# Patient Record
Sex: Female | Born: 1970 | Race: White | Hispanic: No | Marital: Married | Smoking: Never smoker
Health system: Southern US, Community
[De-identification: ages and names within clinical notes are randomized; demographics above are authoritative.]

## PROBLEM LIST (undated history)

## (undated) DIAGNOSIS — F329 Major depressive disorder, single episode, unspecified: Secondary | ICD-10-CM

## (undated) DIAGNOSIS — F32A Depression, unspecified: Secondary | ICD-10-CM

## (undated) DIAGNOSIS — F419 Anxiety disorder, unspecified: Secondary | ICD-10-CM

## (undated) DIAGNOSIS — R87619 Unspecified abnormal cytological findings in specimens from cervix uteri: Secondary | ICD-10-CM

## (undated) DIAGNOSIS — F410 Panic disorder [episodic paroxysmal anxiety] without agoraphobia: Secondary | ICD-10-CM

## (undated) DIAGNOSIS — E7800 Pure hypercholesterolemia, unspecified: Secondary | ICD-10-CM

## (undated) DIAGNOSIS — L509 Urticaria, unspecified: Secondary | ICD-10-CM

## (undated) DIAGNOSIS — T7840XA Allergy, unspecified, initial encounter: Secondary | ICD-10-CM

## (undated) DIAGNOSIS — K219 Gastro-esophageal reflux disease without esophagitis: Secondary | ICD-10-CM

## (undated) HISTORY — DX: Anxiety disorder, unspecified: F41.9

## (undated) HISTORY — DX: Allergy, unspecified, initial encounter: T78.40XA

## (undated) HISTORY — PX: TONSILLECTOMY: SHX5217

## (undated) HISTORY — DX: Unspecified abnormal cytological findings in specimens from cervix uteri: R87.619

## (undated) HISTORY — DX: Pure hypercholesterolemia, unspecified: E78.00

## (undated) HISTORY — PX: CHOLECYSTECTOMY: SHX55

## (undated) HISTORY — PX: KNEE ARTHROSCOPY: SUR90

## (undated) HISTORY — DX: Panic disorder (episodic paroxysmal anxiety): F41.0

## (undated) HISTORY — DX: Major depressive disorder, single episode, unspecified: F32.9

## (undated) HISTORY — PX: KNEE ARTHROSCOPY W/ ACL RECONSTRUCTION: SHX1858

## (undated) HISTORY — DX: Gastro-esophageal reflux disease without esophagitis: K21.9

## (undated) HISTORY — DX: Urticaria, unspecified: L50.9

## (undated) HISTORY — PX: FRACTURE SURGERY: SHX138

## (undated) HISTORY — DX: Depression, unspecified: F32.A

---

## 1998-09-04 ENCOUNTER — Emergency Department (HOSPITAL_COMMUNITY): Admission: EM | Admit: 1998-09-04 | Discharge: 1998-09-04 | Payer: Self-pay | Admitting: Emergency Medicine

## 1999-05-02 ENCOUNTER — Emergency Department (HOSPITAL_COMMUNITY): Admission: EM | Admit: 1999-05-02 | Discharge: 1999-05-02 | Payer: Self-pay | Admitting: Emergency Medicine

## 1999-05-03 ENCOUNTER — Encounter: Payer: Self-pay | Admitting: *Deleted

## 2001-02-08 ENCOUNTER — Encounter: Payer: Self-pay | Admitting: Family Medicine

## 2001-02-08 ENCOUNTER — Encounter: Admission: RE | Admit: 2001-02-08 | Discharge: 2001-02-08 | Payer: Self-pay | Admitting: Family Medicine

## 2001-02-16 ENCOUNTER — Ambulatory Visit (HOSPITAL_COMMUNITY): Admission: RE | Admit: 2001-02-16 | Discharge: 2001-02-16 | Payer: Self-pay | Admitting: Gastroenterology

## 2001-02-16 ENCOUNTER — Encounter: Payer: Self-pay | Admitting: Gastroenterology

## 2003-01-06 ENCOUNTER — Emergency Department (HOSPITAL_COMMUNITY): Admission: EM | Admit: 2003-01-06 | Discharge: 2003-01-06 | Payer: Self-pay | Admitting: Emergency Medicine

## 2006-08-04 ENCOUNTER — Other Ambulatory Visit: Admission: RE | Admit: 2006-08-04 | Discharge: 2006-08-04 | Payer: Self-pay | Admitting: Obstetrics and Gynecology

## 2007-01-12 ENCOUNTER — Ambulatory Visit (HOSPITAL_COMMUNITY): Admission: RE | Admit: 2007-01-12 | Discharge: 2007-01-12 | Payer: Self-pay | Admitting: Obstetrics and Gynecology

## 2007-06-05 ENCOUNTER — Inpatient Hospital Stay (HOSPITAL_COMMUNITY): Admission: AD | Admit: 2007-06-05 | Discharge: 2007-06-08 | Payer: Self-pay | Admitting: Obstetrics and Gynecology

## 2009-02-10 ENCOUNTER — Ambulatory Visit: Payer: Self-pay | Admitting: Family Medicine

## 2009-02-10 ENCOUNTER — Other Ambulatory Visit: Admission: RE | Admit: 2009-02-10 | Discharge: 2009-02-10 | Payer: Self-pay | Admitting: Family Medicine

## 2009-02-10 ENCOUNTER — Encounter: Payer: Self-pay | Admitting: Family Medicine

## 2009-02-10 DIAGNOSIS — R5381 Other malaise: Secondary | ICD-10-CM

## 2009-02-10 DIAGNOSIS — K8020 Calculus of gallbladder without cholecystitis without obstruction: Secondary | ICD-10-CM | POA: Insufficient documentation

## 2009-02-10 DIAGNOSIS — N6019 Diffuse cystic mastopathy of unspecified breast: Secondary | ICD-10-CM | POA: Insufficient documentation

## 2009-02-10 DIAGNOSIS — R5383 Other fatigue: Secondary | ICD-10-CM

## 2009-02-10 LAB — CONVERTED CEMR LAB
ALT: 13 units/L (ref 0–35)
AST: 19 units/L (ref 0–37)
Albumin: 4.2 g/dL (ref 3.5–5.2)
Alkaline Phosphatase: 40 units/L (ref 39–117)
BUN: 14 mg/dL (ref 6–23)
Basophils Relative: 1 % (ref 0.0–3.0)
Bilirubin Urine: NEGATIVE
Bilirubin, Direct: 0 mg/dL (ref 0.0–0.3)
CO2: 29 meq/L (ref 19–32)
Calcium: 9.5 mg/dL (ref 8.4–10.5)
Chloride: 108 meq/L (ref 96–112)
Cholesterol: 182 mg/dL (ref 0–200)
Creatinine, Ser: 0.9 mg/dL (ref 0.4–1.2)
Eosinophils Relative: 14 % — ABNORMAL HIGH (ref 0.0–5.0)
Free T4: 0.8 ng/dL (ref 0.6–1.6)
GFR calc non Af Amer: 74.98 mL/min (ref >60)
Glucose, Bld: 102 mg/dL — ABNORMAL HIGH (ref 70–99)
Glucose, Urine, Semiquant: NEGATIVE
HCT: 39.6 % (ref 36.0–46.0)
HDL: 36.2 mg/dL — ABNORMAL LOW (ref >39.00)
Hemoglobin: 13.8 g/dL (ref 12.0–15.0)
Ketones, urine, test strip: NEGATIVE
LDL Cholesterol: 117 mg/dL — ABNORMAL HIGH (ref 0–99)
Lymphocytes Relative: 22 % (ref 12.0–46.0)
MCHC: 34.8 g/dL (ref 30.0–36.0)
MCV: 90.9 fL (ref 78.0–100.0)
Monocytes Relative: 10 % (ref 3.0–12.0)
Neutrophils Relative %: 53 % (ref 43.0–77.0)
Nitrite: NEGATIVE
Platelets: 240 10*3/uL (ref 150.0–400.0)
Potassium: 4.4 meq/L (ref 3.5–5.1)
Protein, U semiquant: NEGATIVE
RBC: 4.36 M/uL (ref 3.87–5.11)
RDW: 11.8 % (ref 11.5–14.6)
Sodium: 142 meq/L (ref 135–145)
Specific Gravity, Urine: 1.02
T3, Free: 2.9 pg/mL (ref 2.3–4.2)
TSH: 1.26 microintl units/mL (ref 0.35–5.50)
Total Bilirubin: 0.7 mg/dL (ref 0.3–1.2)
Total CHOL/HDL Ratio: 5
Total Protein: 7 g/dL (ref 6.0–8.3)
Triglycerides: 146 mg/dL (ref 0.0–149.0)
Urobilinogen, UA: 1
VLDL: 29.2 mg/dL (ref 0.0–40.0)
WBC Urine, dipstick: NEGATIVE
WBC: 9.4 10*3/uL (ref 4.5–10.5)
pH: 7

## 2009-02-11 ENCOUNTER — Telehealth: Payer: Self-pay | Admitting: Family Medicine

## 2009-03-14 ENCOUNTER — Ambulatory Visit: Payer: Self-pay | Admitting: Family Medicine

## 2009-03-14 DIAGNOSIS — M79609 Pain in unspecified limb: Secondary | ICD-10-CM

## 2009-03-24 ENCOUNTER — Ambulatory Visit: Payer: Self-pay | Admitting: Family Medicine

## 2009-03-24 ENCOUNTER — Telehealth: Payer: Self-pay | Admitting: Family Medicine

## 2009-04-03 ENCOUNTER — Encounter: Admission: RE | Admit: 2009-04-03 | Discharge: 2009-04-03 | Payer: Self-pay | Admitting: Family Medicine

## 2010-01-22 ENCOUNTER — Telehealth: Payer: Self-pay | Admitting: Family Medicine

## 2010-06-21 ENCOUNTER — Emergency Department (HOSPITAL_COMMUNITY)
Admission: EM | Admit: 2010-06-21 | Discharge: 2010-06-22 | Payer: Self-pay | Source: Home / Self Care | Admitting: Emergency Medicine

## 2010-06-21 LAB — DIFFERENTIAL
Basophils Absolute: 0 10*3/uL (ref 0.0–0.1)
Basophils Relative: 0 % (ref 0–1)
Eosinophils Absolute: 0.3 10*3/uL (ref 0.0–0.7)
Eosinophils Relative: 3 % (ref 0–5)
Lymphocytes Relative: 12 % (ref 12–46)
Lymphs Abs: 1.7 10*3/uL (ref 0.7–4.0)
Monocytes Absolute: 0.9 10*3/uL (ref 0.1–1.0)
Monocytes Relative: 7 % (ref 3–12)
Neutro Abs: 10.8 10*3/uL — ABNORMAL HIGH (ref 1.7–7.7)
Neutrophils Relative %: 79 % — ABNORMAL HIGH (ref 43–77)

## 2010-06-21 LAB — BASIC METABOLIC PANEL
BUN: 12 mg/dL (ref 6–23)
CO2: 25 mEq/L (ref 19–32)
Calcium: 9.3 mg/dL (ref 8.4–10.5)
Chloride: 106 mEq/L (ref 96–112)
Creatinine, Ser: 0.98 mg/dL (ref 0.4–1.2)
GFR calc Af Amer: >60 mL/min (ref >60)
GFR calc non Af Amer: >60 mL/min (ref >60)
Glucose, Bld: 118 mg/dL — ABNORMAL HIGH (ref 70–99)
Potassium: 3.5 mEq/L (ref 3.5–5.1)
Sodium: 136 mEq/L (ref 135–145)

## 2010-06-21 LAB — POCT CARDIAC MARKERS
CKMB, poc: <1 ng/mL — ABNORMAL LOW (ref 1.0–8.0)
Myoglobin, poc: 36.4 ng/mL (ref 12–200)

## 2010-06-21 LAB — CBC
HCT: 37 % (ref 36.0–46.0)
Hemoglobin: 13.4 g/dL (ref 12.0–15.0)
MCHC: 36.2 g/dL — ABNORMAL HIGH (ref 30.0–36.0)
WBC: 13.7 10*3/uL — ABNORMAL HIGH (ref 4.0–10.5)

## 2010-06-23 NOTE — Progress Notes (Signed)
Summary: UTI  Phone Note Call from Patient   Caller: Patient Call For: Roderick Pee MD Summary of Call: Reason for Call: Acute Illness Complaint: Urinary/GYN Problems Action Taken: Provider Notified Summary of Call: Pt has lower back pain and dysura with a positive home test for UTIs.  Traveling to [place] as soon as they can today.  Requesting Antibioitc. [id number] Initial call taken by: Lynann Beaver CMA,  January 22, 2010 8:41 AM Initial call taken by: Lynann Beaver CMA,  January 22, 2010 10:02 AM  Follow-up for Phone Call        come in now for office visit Follow-up by: Roderick Pee MD,  January 22, 2010 10:06 AM  Additional Follow-up for Phone Call Additional follow up Details #1::        Pt cannot come in......[facility] to [place]. Additional Follow-up by: Lynann Beaver CMA,  January 22, 2010 10:09 AM

## 2010-10-01 ENCOUNTER — Other Ambulatory Visit: Payer: Self-pay | Admitting: Physician Assistant

## 2010-10-01 DIAGNOSIS — Z1231 Encounter for screening mammogram for malignant neoplasm of breast: Secondary | ICD-10-CM

## 2010-10-06 ENCOUNTER — Ambulatory Visit
Admission: RE | Admit: 2010-10-06 | Discharge: 2010-10-06 | Disposition: A | Discharge status: Home / Self Care | Payer: No Typology Code available for payment source | Source: Ambulatory Visit | Attending: Physician Assistant | Admitting: Physician Assistant

## 2010-10-06 DIAGNOSIS — Z1231 Encounter for screening mammogram for malignant neoplasm of breast: Secondary | ICD-10-CM

## 2011-02-11 LAB — CBC
HCT: 30.7 — ABNORMAL LOW
Hemoglobin: 10.7 — ABNORMAL LOW
RBC: 4.32
WBC: 14.5 — ABNORMAL HIGH
WBC: 17.7 — ABNORMAL HIGH

## 2011-02-11 LAB — RPR: RPR Ser Ql: NONREACTIVE

## 2011-07-21 ENCOUNTER — Ambulatory Visit (INDEPENDENT_AMBULATORY_CARE_PROVIDER_SITE_OTHER): Payer: No Typology Code available for payment source | Admitting: Physician Assistant

## 2011-07-21 ENCOUNTER — Encounter: Payer: Self-pay | Admitting: Physician Assistant

## 2011-07-21 VITALS — BP 103/61 | HR 79 | Temp 98.5°F | Resp 16 | Ht 68.0 in | Wt 175.0 lb

## 2011-07-21 DIAGNOSIS — N6321 Unspecified lump in the left breast, upper outer quadrant: Secondary | ICD-10-CM

## 2011-07-21 DIAGNOSIS — N63 Unspecified lump in unspecified breast: Secondary | ICD-10-CM

## 2011-07-21 NOTE — Patient Instructions (Signed)
1:45pm today.  Dx MMG at [facility].  Order filled out.  Unable to enter in Epic

## 2011-07-21 NOTE — Progress Notes (Signed)
  Subjective:    Patient ID: Debbie Shaw, female    DOB: 27-Jun-1970, 41 y.o.   MRN: [id number]  HPI 2.5 week h/o lump in L breast.  Unchanged after menstrual cycle.  February 14thish was 1st day of last period.  Not on birth control.  Uses condoms with husband.    Last mammogram 09/2010 and was normal.  Review of Systems  All other systems reviewed and are negative.       Objective:   Physical Exam  Nursing note and vitals reviewed. Constitutional: She appears well-developed and well-nourished.  HENT:  Head: Normocephalic and atraumatic.  Neck: Normal range of motion. Neck supple.  Cardiovascular: Normal rate and regular rhythm.  Exam reveals no gallop and no friction rub.   No murmur heard. Pulmonary/Chest: Effort normal and breath sounds normal. She exhibits no mass. Right breast exhibits inverted nipple. Right breast exhibits no mass (def fibrocystic changes, B), no nipple discharge, no skin change and no tenderness. Left breast exhibits inverted nipple and mass. Left breast exhibits no nipple discharge, no skin change and no tenderness. Breasts are symmetrical.    Lymphadenopathy:    She has no cervical adenopathy.       Right axillary: No pectoral and no lateral adenopathy present.       Left axillary: No pectoral and no lateral adenopathy present.      Right: No supraclavicular adenopathy present.       Left: No supraclavicular adenopathy present.          Assessment & Plan:  Breast lump-Diagnostic Mammogram ASAP.

## 2011-08-10 ENCOUNTER — Encounter: Payer: Self-pay | Admitting: Physician Assistant

## 2011-09-07 ENCOUNTER — Ambulatory Visit (INDEPENDENT_AMBULATORY_CARE_PROVIDER_SITE_OTHER): Payer: No Typology Code available for payment source | Admitting: Family Medicine

## 2011-09-07 ENCOUNTER — Ambulatory Visit: Payer: No Typology Code available for payment source

## 2011-09-07 ENCOUNTER — Encounter: Payer: Self-pay | Admitting: Family Medicine

## 2011-09-07 VITALS — BP 108/69 | HR 64 | Temp 98.0°F | Resp 32 | Ht 69.0 in | Wt 170.0 lb

## 2011-09-07 DIAGNOSIS — R071 Chest pain on breathing: Secondary | ICD-10-CM

## 2011-09-07 DIAGNOSIS — M940 Chondrocostal junction syndrome [Tietze]: Secondary | ICD-10-CM

## 2011-09-07 LAB — POCT CBC
Granulocyte percent: 76.6 %G (ref 37–80)
HCT, POC: 38.8 % (ref 37.7–47.9)
Hemoglobin: 12.8 g/dL (ref 12.2–16.2)
Lymph, poc: 1.7 (ref 0.6–3.4)
MCH, POC: 29.6 pg (ref 27–31.2)
MCHC: 33 g/dL (ref 31.8–35.4)
MCV: 89.6 fL (ref 80–97)
MID (cbc): 0.7 (ref 0–0.9)
MPV: 8.7 fL (ref 0–99.8)
POC Granulocyte: 8 — AB (ref 2–6.9)
POC LYMPH PERCENT: 16.7 %L (ref 10–50)
POC MID %: 6.7 % (ref 0–12)
Platelet Count, POC: 317 10*3/uL (ref 142–424)
RBC: 4.33 M/uL (ref 4.04–5.48)
RDW, POC: 12.1 %
WBC: 10.4 10*3/uL — AB (ref 4.6–10.2)

## 2011-09-07 LAB — COMPREHENSIVE METABOLIC PANEL
Alkaline Phosphatase: 40 U/L (ref 39–117)
Creat: 0.97 mg/dL (ref 0.50–1.10)
Glucose, Bld: 91 mg/dL (ref 70–99)
Sodium: 137 mEq/L (ref 135–145)
Total Bilirubin: 0.6 mg/dL (ref 0.3–1.2)
Total Protein: 6.3 g/dL (ref 6.0–8.3)

## 2011-09-07 LAB — COMPREHENSIVE METABOLIC PANEL WITH GFR
ALT: 14 U/L (ref 0–35)
AST: 18 U/L (ref 0–37)
Albumin: 4 g/dL (ref 3.5–5.2)
BUN: 17 mg/dL (ref 6–23)
CO2: 27 meq/L (ref 19–32)
Calcium: 9 mg/dL (ref 8.4–10.5)
Chloride: 103 meq/L (ref 96–112)
Potassium: 3.7 meq/L (ref 3.5–5.3)

## 2011-09-07 LAB — GLUCOSE, POCT (MANUAL RESULT ENTRY): POC Glucose: 88

## 2011-09-07 NOTE — Patient Instructions (Signed)
Chest Wall Pain Chest wall pain is pain felt in or around the chest bones and muscles. It may take up to 6 weeks to get better. It may take longer if you are active. Chest wall pain can happen on its own. Other times, things like germs, injury, coughing, or exercise can cause the pain. HOME CARE   Avoid activities that make you tired or cause pain. Try not to use your chest, belly (abdominal), or side muscles. Do not use heavy weights.   Put ice on the sore area.   Put ice in a plastic bag.   Place a towel between your skin and the bag.   Leave the ice on for 15 to 20 minutes for the first 2 days.   Only take medicine as told by your doctor.  GET HELP RIGHT AWAY IF:   You have more pain or are very uncomfortable.   You have a fever.   Your chest pain gets worse.   You have new problems.   You feel sick to your stomach (nauseous) or throw up (vomit).   You start to sweat or feel lightheaded.   You have a cough with mucus (phlegm).   You cough up blood.  MAKE SURE YOU:   Understand these instructions.   Will watch your condition.   Will get help right away if you are not doing well or get worse.  Document Released: 10/27/2007 Document Revised: 04/29/2011 Document Reviewed: 01/04/2011 ExitCare Patient Information 2012 [employer]. 

## 2011-09-07 NOTE — Progress Notes (Signed)
[facility]:  Office Visit  Chief Complaint:  Chief Complaint  Patient presents with  . Chest Pain    HPI: Debbie Shaw is a 41 y.o. female who complains of  acute onset of right sided chest pain radiating to back which started 30 min ago while moving boxes. The boxes were not heavy.  Described as squeezing pain 10/10, she had to stop to help relieve some of the pain. Pain was worse with stretching and also with deep inspiration. Associated symptoms include slight dizziness, clamminess, pins and needles sensation in bilateral hands, SOB. She took a Xanax 0.5 mg x 1 when this happened. Denies any perioral tingling. She works out regular without any CP/SOB. She denies any HA, confusions, vision changes, N/V, abd pain.  She has no risk factors for heart disease, no premature family hx of heart disease.  She does have a history of costochondritis, GERD, anxiety. Denies any recent illnesses.  She has not taken anything for this except her Xanax. On arrival she was breathing at a respiration of 32.   Past Medical History  Diagnosis Date  . Allergy   . Asthma   . Anxiety    Past Surgical History  Procedure Date  . Cholecystectomy   . Knee arthroscopy    History   Social History  . Marital Status: Married    Spouse Name: N/A    Number of Children: N/A  . Years of Education: N/A   Social History Main Topics  . Smoking status: Never Smoker   . Smokeless tobacco: Not on file  . Alcohol Use: Yes  . Drug Use: No  . Sexually Active: Not on file   Other Topics Concern  . Not on file   Social History Narrative  . No narrative on file   No family history on file. Allergies  Allergen Reactions  . Sulfa Antibiotics Nausea And Vomiting   Prior to Admission medications   Medication Sig Start Date End Date Taking? Authorizing Provider  cetirizine (ZYRTEC) 10 MG tablet Take 10 mg by mouth daily.    Historical Provider, MD  Multiple Vitamin (MULTIVITAMIN)  tablet Take 1 tablet by mouth daily.    Historical Provider, MD     ROS: The patient denies fevers, chills, night sweats, unintentional weight loss, palpitations, wheezing, dyspnea on exertion, nausea, vomiting, abdominal pain, dysuria, hematuria, melena, numbness, weakness, or tingling. + CP, SOB  All other systems have been reviewed and were otherwise negative with the exception of those mentioned in the HPI and as above.    PHYSICAL EXAM: Filed Vitals:   09/07/11 1344  BP: 108/69  Pulse: 64  Temp: 98 F (36.7 C)  Resp: 32   Filed Vitals:   09/07/11 1344  Height: 5\' 9"  (1.753 m)  Weight: 170 lb (77.111 kg)   Body mass index is 25.10 kg/(m^2).  General: Alert, no acute distress, anxious, breathing heavily HEENT:  Normocephalic, atraumatic, oropharynx patent. Tm nl, EOMI, PERRLA.  Cardiovascular:  Regular rate and rhythm, no rubs murmurs or gallops.  No Carotid bruits, radial pulse intact. No pedal edema.  Respiratory: Clear to auscultation bilaterally.  No wheezes, rales, or rhonchi.  No cyanosis, no use of accessory musculature GI: No organomegaly, abdomen is soft and non-tender, positive bowel sounds.  No masses. Skin: No rashes. Neurologic: Facial musculature symmetric. CN 2-12 grossly normal.  Psychiatric: Patient is appropriate throughout our interaction. Lymphatic: No cervical lymphadenopathy Musculoskeletal: Gait intact.   LABS: Results for orders placed  in visit on 09/07/11  POCT CBC      Component Value Range   WBC 10.4 (*) 4.6 - 10.2 (K/uL)   Lymph, poc 1.7  0.6 - 3.4    POC LYMPH PERCENT 16.7  10 - 50 (%L)   MID (cbc) 0.7  0 - 0.9    POC MID % 6.7  0 - 12 (%M)   POC Granulocyte 8.0 (*) 2 - 6.9    Granulocyte percent 76.6  37 - 80 (%G)   RBC 4.33  4.04 - 5.48 (M/uL)   Hemoglobin 12.8  12.2 - 16.2 (g/dL)   HCT, POC 16.1  09.6 - 47.9 (%)   MCV 89.6  80 - 97 (fL)   MCH, POC 29.6  27 - 31.2 (pg)   MCHC 33.0  31.8 - 35.4 (g/dL)   RDW, POC 04.5     Platelet  Count, POC 317  142 - 424 (K/uL)   MPV 8.7  0 - 99.8 (fL)  GLUCOSE, POCT (MANUAL RESULT ENTRY)      Component Value Range   POC Glucose 88       EKG/XRAY:   Primary read interpreted by Dr. Conley Rolls at [facility]. 67 bpm sinus, no ST-T wave abnormalities CXR normal   ASSESSMENT/PLAN: Encounter Diagnoses  Name Primary?  . Chest pain on breathing Yes  . Costochondritis    Etiology: ? Pleuritic CP vs costochondritis/chest wall pain vs less likely pericarditis vs less likely NSTEMI Take NSAIDs and monitor s/sx. Will call tomorrow to see how she is doing. Consider cardiology workup if warranted pending f/u call.  CMP pending.  Patient instructed if worsening symptoms, go to ER.   Jasin Brazel PHUONG, DO 09/07/2011 3:18 PM

## 2011-09-08 ENCOUNTER — Telehealth: Payer: Self-pay

## 2011-09-08 NOTE — Telephone Encounter (Signed)
Pt seen in office and she had another episode where her ear became numb and having chest pains she would like a return phone call, to see what else she can do to figure out what's going on.

## 2011-09-09 ENCOUNTER — Ambulatory Visit (INDEPENDENT_AMBULATORY_CARE_PROVIDER_SITE_OTHER): Payer: No Typology Code available for payment source | Admitting: Family Medicine

## 2011-09-09 ENCOUNTER — Telehealth: Payer: Self-pay | Admitting: Family Medicine

## 2011-09-09 DIAGNOSIS — M940 Chondrocostal junction syndrome [Tietze]: Secondary | ICD-10-CM

## 2011-09-09 DIAGNOSIS — R079 Chest pain, unspecified: Secondary | ICD-10-CM

## 2011-09-09 DIAGNOSIS — R002 Palpitations: Secondary | ICD-10-CM

## 2011-09-09 LAB — POCT CBC
Granulocyte percent: 66.6 % (ref 37–80)
HCT, POC: 38.2 % (ref 37.7–47.9)
Hemoglobin: 12.3 g/dL (ref 12.2–16.2)
Lymph, poc: 2.3 (ref 0.6–3.4)
MCH, POC: 29.1 pg (ref 27–31.2)
MCHC: 32.2 g/dL (ref 31.8–35.4)
MCV: 90.2 fL (ref 80–97)
MID (cbc): 0.6 (ref 0–0.9)
MPV: 8.7 fL (ref 0–99.8)
POC Granulocyte: 5.8 (ref 2–6.9)
POC LYMPH PERCENT: 26.5 %L (ref 10–50)
POC MID %: 6.9 %M (ref 0–12)
Platelet Count, POC: 295 10*3/uL (ref 142–424)
RBC: 4.23 M/uL (ref 4.04–5.48)
RDW, POC: 12.2 %
WBC: 8.7 10*3/uL (ref 4.6–10.2)

## 2011-09-09 MED ORDER — NAPROXEN 500 MG PO TABS: 500.0000 mg | ORAL_TABLET | Freq: Two times a day (BID) | ORAL | Status: DC

## 2011-09-09 NOTE — Telephone Encounter (Signed)
Please advise on this.  

## 2011-09-09 NOTE — Telephone Encounter (Signed)
Called pt and she will come in for re-evaluation. Not  feeling better. Does not want to go to ER.

## 2011-09-09 NOTE — Progress Notes (Signed)
[facility]:  Office Visit  Chief Complaint:  Chief Complaint  Patient presents with  . Follow-up    HPI: Debbie Shaw is a 41 y.o. female who complains of improving right sided chest pain. This is a recheck. She states that she had another episode where she was at [employer] with her child. She was at rest and started having right sided localized CP near her sternum, " butterflies in her ears" and bilateral numbness, tingling in her hands. She also felt fatigued. Patient states she has taken a Xanax prior to this.   She did not tell me this on last visit but she recently travelled to [place] and [place] by car and plane respectively. No other risk factors for PE/DVT.  Past Medical History  Diagnosis Date  . Allergy   . Asthma   . Anxiety    Past Surgical History  Procedure Date  . Cholecystectomy   . Knee arthroscopy    History   Social History  . Marital Status: Married    Spouse Name: N/A    Number of Children: N/A  . Years of Education: N/A   Social History Main Topics  . Smoking status: Never Smoker   . Smokeless tobacco: None  . Alcohol Use: Yes  . Drug Use: No  . Sexually Active: None   Other Topics Concern  . None   Social History Narrative  . None   No family history on file. Allergies  Allergen Reactions  . Sulfa Antibiotics Nausea And Vomiting   Prior to Admission medications   Medication Sig Start Date End Date Taking? Authorizing Provider  loratadine (CLARITIN) 10 MG tablet Take 10 mg by mouth daily.   Yes Historical Provider, MD  cetirizine (ZYRTEC) 10 MG tablet Take 10 mg by mouth daily.    Historical Provider, MD  Multiple Vitamin (MULTIVITAMIN) tablet Take 1 tablet by mouth daily.    Historical Provider, MD     ROS: The patient denies fevers, chills, night sweats, unintentional weight loss,  wheezing, dyspnea on exertion, nausea, vomiting, abdominal pain, dysuria, hematuria, melena.   All other systems  have been reviewed and were otherwise negative with the exception of those mentioned in the HPI and as above.    PHYSICAL EXAM: Filed Vitals:   09/09/11 1512  BP: 133/77  Pulse: 71  Temp: 98 F (36.7 C)  Resp: 18  Spo2   97%  Filed Vitals:   09/09/11 1512  Height: 5\' 8"  (1.727 m)  Weight: 173 lb 6.4 oz (78.654 kg)   Body mass index is 26.37 kg/(m^2).  General: Alert, no acute distress HEENT:  Normocephalic, atraumatic, oropharynx patent.  Cardiovascular:  Regular rate and rhythm, no rubs murmurs or gallops.  No Carotid bruits, radial pulse intact. No pedal edema.  Respiratory: Clear to auscultation bilaterally.  No wheezes, rales, or rhonchi.  No cyanosis, no use of accessory musculature GI: No organomegaly, abdomen is soft and non-tender, positive bowel sounds.  No masses. Skin: No rashes. Neurologic: Facial musculature symmetric. Psychiatric: Patient is appropriate throughout our interaction. Lymphatic: No cervical lymphadenopathy Musculoskeletal: Gait intact.   LABS: Results for orders placed in visit on 09/09/11  POCT CBC      Component Value Range   WBC 8.7  4.6 - 10.2 (K/uL)   Lymph, poc 2.3  0.6 - 3.4    POC LYMPH PERCENT 26.5  10 - 50 (%L)   MID (cbc) 0.6  0 - 0.9  POC MID % 6.9  0 - 12 (%M)   POC Granulocyte 5.8  2 - 6.9    Granulocyte percent 66.6  37 - 80 (%G)   RBC 4.23  4.04 - 5.48 (M/uL)   Hemoglobin 12.3  12.2 - 16.2 (g/dL)   HCT, POC 40.9  81.1 - 47.9 (%)   MCV 90.2  80 - 97 (fL)   MCH, POC 29.1  27 - 31.2 (pg)   MCHC 32.2  31.8 - 35.4 (g/dL)   RDW, POC 91.4     Platelet Count, POC 295  142 - 424 (K/uL)   MPV 8.7  0 - 99.8 (fL)     EKG/XRAY:   Primary read interpreted by Dr. Conley Rolls at [facility].   ASSESSMENT/PLAN: Encounter Diagnoses  Name Primary?  . Chest pain Yes  . Palpitations    Patient given Naproxen 500 mg BID for chest wall pain. Advise to stop Xanax. D/w patient labs. Pending labs are D-dimer, Troponin I, TSH. CBC improved since last  visit.  Will continue to monitor for s/sx. Patient currently prefers not to go to ER. Hence all the labs we are doing are based on this. CXR was normal 2 days ago. CMP was also normal. Will get D-dimer and tropnin I x 1.   Continue with NSAIDs and rest.  Consider cardiology consult if this does not improve.     Hamilton Capri PHUONG, DO 09/09/2011 4:01 PM

## 2011-09-09 NOTE — Telephone Encounter (Signed)
Will forward to Dr. Conley Rolls, who is here presently to address.   Debbie Shaw

## 2011-09-10 ENCOUNTER — Telehealth: Payer: Self-pay | Admitting: Family Medicine

## 2011-09-10 LAB — TSH: TSH: 2.034 u[IU]/mL (ref 0.350–4.500)

## 2011-09-10 LAB — TROPONIN I: Troponin I: <0.01 ng/mL (ref <0.06)

## 2011-09-10 LAB — D-DIMER, QUANTITATIVE: D-Dimer, Quant: 0.46 ug{FEU}/mL (ref 0.00–0.48)

## 2011-09-10 NOTE — Telephone Encounter (Signed)
Dr Conley Rolls, I'm not sure if this message routed correctly to you from [place], so I'm sending it again.

## 2011-09-10 NOTE — Telephone Encounter (Signed)
Spoke with patient regarding normal TSH, D-dimer, troponin results. If she has panic attacks then consider CBT since she does not want to be on meds. Cont with Naproxen with food.

## 2011-09-14 ENCOUNTER — Ambulatory Visit (INDEPENDENT_AMBULATORY_CARE_PROVIDER_SITE_OTHER): Payer: No Typology Code available for payment source | Admitting: Physician Assistant

## 2011-09-14 VITALS — BP 109/68 | HR 65 | Temp 98.8°F | Resp 16 | Ht 68.0 in | Wt 172.4 lb

## 2011-09-14 DIAGNOSIS — R079 Chest pain, unspecified: Secondary | ICD-10-CM

## 2011-09-14 DIAGNOSIS — F411 Generalized anxiety disorder: Secondary | ICD-10-CM

## 2011-09-14 DIAGNOSIS — R1013 Epigastric pain: Secondary | ICD-10-CM

## 2011-09-14 LAB — POCT CBC
Granulocyte percent: 63.6 %G (ref 37–80)
Hemoglobin: 12.5 g/dL (ref 12.2–16.2)
MID (cbc): 0.8 (ref 0–0.9)
MPV: 8.5 fL (ref 0–99.8)
POC Granulocyte: 5.7 (ref 2–6.9)
POC MID %: 8.7 %M (ref 0–12)
Platelet Count, POC: 288 10*3/uL (ref 142–424)
RBC: 4.24 M/uL (ref 4.04–5.48)

## 2011-09-14 MED ORDER — VALACYCLOVIR HCL 1 G PO TABS: ORAL_TABLET | ORAL | Status: DC

## 2011-09-14 MED ORDER — OMEPRAZOLE 20 MG PO CPDR: 20.0000 mg | DELAYED_RELEASE_CAPSULE | Freq: Every day | ORAL | Status: DC

## 2011-09-14 NOTE — Progress Notes (Signed)
  Subjective:    Patient ID: Debbie Shaw, female    DOB: 31-Mar-1971, 41 y.o.   MRN: [id number]  HPI 41 yo CF c/o R sided CP that has persisted on and off all week.  (see last OV-very thorough w/up to R/O dangerous causes). The pain is unilateral and present along the chest and back (appears to be along T1-T2 dermatome based on where patient shows me).  She has also developed worsening reflux over the last 6 days and has resumed her omeprazole.  She also just generally feels fatigued and flushed at times.  She does admit to a very high stress level currently with step-children and husband and she hasn't been taking very good care of herself bc she is taking care of everyone else in the family. After being seen last week, she decided to clear her schedule for 2 weeks.  She is going to the beach this weekend.  She spent time with girlfriends today and went and got a pedicure and is feeling some better. Has taken the xanax I gave her last year a couple of times when feeling panicked which has helped.  Review of Systems  All other systems reviewed and are negative.       Objective:   Physical Exam  Nursing note and vitals reviewed. Constitutional: She is oriented to person, place, and time. She appears well-developed and well-nourished.  HENT:  Head: Normocephalic and atraumatic.  Right Ear: External ear normal.  Left Ear: External ear normal.  Mouth/Throat: No oropharyngeal exudate.       Turbinates boggy and pale  Neck: Normal range of motion. Neck supple. No thyromegaly present.  Cardiovascular: Normal rate, regular rhythm and normal heart sounds.   Pulmonary/Chest: Effort normal and breath sounds normal.  Abdominal: Soft. Bowel sounds are normal.  Lymphadenopathy:    She has no cervical adenopathy.  Neurological: She is alert and oriented to person, place, and time.  Skin: Skin is warm and dry. No rash noted. No erythema. No pallor.       Slight hyperesthesia R>L along T1-T2  dermatome.  No rash/vesicles.  Psychiatric: She has a normal mood and affect. Her behavior is normal.   Results for orders placed in visit on 09/14/11  POCT CBC      Component Value Range   WBC 8.9  4.6 - 10.2 (K/uL)   Lymph, poc 2.5  0.6 - 3.4    POC LYMPH PERCENT 27.7  10 - 50 (%L)   MID (cbc) 0.8  0 - 0.9    POC MID % 8.7  0 - 12 (%M)   POC Granulocyte 5.7  2 - 6.9    Granulocyte percent 63.6  37 - 80 (%G)   RBC 4.24  4.04 - 5.48 (M/uL)   Hemoglobin 12.5  12.2 - 16.2 (g/dL)   HCT, POC 78.2  95.6 - 47.9 (%)   MCV 89.0  80 - 97 (fL)   MCH, POC 29.5  27 - 31.2 (pg)   MCHC 33.2  31.8 - 35.4 (g/dL)   RDW, POC 21.3     Platelet Count, POC 288  142 - 424 (K/uL)   MPV 8.5  0 - 99.8 (fL)         Assessment & Plan:  ?Shingles prodrome Definite anxiety-continue with counseling, deep breathing, and use xanax sparingly when needed. Dyspepsia-Continue omeprazole. Check h. pylori Discussed CP warnings at length-911, patient expresses understanding and agrees.

## 2011-09-16 LAB — H. PYLORI ANTIBODY, IGG: H Pylori IgG: 0.42 {ISR}

## 2011-09-28 ENCOUNTER — Encounter: Payer: Self-pay | Admitting: Gastroenterology

## 2011-10-19 ENCOUNTER — Encounter: Payer: Self-pay | Admitting: Gastroenterology

## 2011-10-19 ENCOUNTER — Ambulatory Visit (INDEPENDENT_AMBULATORY_CARE_PROVIDER_SITE_OTHER): Payer: No Typology Code available for payment source | Admitting: Gastroenterology

## 2011-10-19 VITALS — BP 100/62 | HR 76 | Ht 69.0 in | Wt 172.2 lb

## 2011-10-19 DIAGNOSIS — R1013 Epigastric pain: Secondary | ICD-10-CM

## 2011-10-19 DIAGNOSIS — R079 Chest pain, unspecified: Secondary | ICD-10-CM

## 2011-10-19 MED ORDER — HYOSCYAMINE SULFATE 0.125 MG SL SUBL: 0.1250 mg | SUBLINGUAL_TABLET | SUBLINGUAL | Status: DC | PRN

## 2011-10-19 MED ORDER — DEXLANSOPRAZOLE 60 MG PO CPDR: 60.0000 mg | DELAYED_RELEASE_CAPSULE | Freq: Every day | ORAL | Status: DC

## 2011-10-19 NOTE — Progress Notes (Signed)
History of Present Illness: This is a 41 year old female who relates a 2 month history of intermittent epigastric discomfort and intermittent abdominal bloating. She has had recurrent problems with right-sided chest pain felt due to costochondritis. About 2 months ago she had anxiety and panic attack related to her chest pain. She has occasional reflux symptoms and nausea generally occur in the evening despite being treated with omeprazole 20 mg twice daily. She states she was taking Naprosyn for her chest pain which led to dyspeptic symptoms. She has only been using ibuprofen on rare occasion recently. Denies weight loss, constipation, diarrhea, change in stool caliber, melena, hematochezia, vomiting, dysphagia.  Review of Systems: Pertinent positive and negative review of systems were noted in the above HPI section. All other review of systems were otherwise negative.  Current Medications, Allergies, Past Medical History, Past Surgical History, Family History and Social History were reviewed in [employer] record.  Physical Exam: General: Well developed , well nourished, no acute distress Head: Normocephalic and atraumatic Eyes:  sclerae anicteric, EOMI Ears: Normal auditory acuity Mouth: No deformity or lesions Neck: Supple, no masses or thyromegaly Lungs: Clear throughout to auscultation, right upper sternal and chest wall tenderness  Heart: Regular rate and rhythm; no murmurs, rubs or bruits Abdomen: Soft, minimal epigastric tenderness without rebound or guarding and non distended. No masses, hepatosplenomegaly or hernias noted. Normal Bowel sounds Musculoskeletal: Symmetrical with no gross deformities  Skin: No lesions on visible extremities Pulses:  Normal pulses noted Extremities: No clubbing, cyanosis, edema or deformities noted Neurological: Alert oriented x 4, grossly nonfocal Cervical Nodes:  No significant cervical adenopathy Inguinal Nodes: No significant  inguinal adenopathy Psychological:  Alert and cooperative. Normal mood and affect  Assessment and Recommendations:  1. Right-sided chest pain appears to be musculoskeletal in etiology. Further followup with primary physician.  2. Epigastric pain, bloating and mild reflux symptoms. Rule out GERD, gastritis, functional dyspepsia. She is status post cholecystectomy. Trial of Dexilant 60 mg every morning in place of omeprazole. Trial of Levsin 1-2 every 4 hours when necessary. The risks, benefits, and alternatives to endoscopy with possible biopsy and possible dilation were discussed with the patient and they consent to proceed.

## 2011-10-19 NOTE — Patient Instructions (Signed)
You have been scheduled for an endoscopy. Please follow written instructions given to you at your visit today. Stop taking omeprazole. Start Dexilant samples one tablet by mouth once daily until Upper Endoscopy. We have sent the following medications to your pharmacy for you to pick up at your convenience: Levsin. Patient advised to avoid spicy, acidic, citrus, chocolate, mints, fruit and fruit juices.  Limit the intake of caffeine, alcohol and Soda.  Don't exercise too soon after eating.  Don't lie down within 3-4 hours of eating.  Elevate the head of your bed. cc: Georgian Co, MD

## 2011-10-20 ENCOUNTER — Ambulatory Visit (INDEPENDENT_AMBULATORY_CARE_PROVIDER_SITE_OTHER): Payer: No Typology Code available for payment source | Admitting: Physician Assistant

## 2011-10-20 ENCOUNTER — Encounter: Payer: Self-pay | Admitting: Physician Assistant

## 2011-10-20 VITALS — BP 110/72 | HR 72 | Temp 98.1°F | Resp 16 | Ht 68.5 in | Wt 170.8 lb

## 2011-10-20 DIAGNOSIS — R002 Palpitations: Secondary | ICD-10-CM

## 2011-10-20 DIAGNOSIS — F411 Generalized anxiety disorder: Secondary | ICD-10-CM

## 2011-10-20 DIAGNOSIS — R6889 Other general symptoms and signs: Secondary | ICD-10-CM

## 2011-10-20 DIAGNOSIS — F419 Anxiety disorder, unspecified: Secondary | ICD-10-CM

## 2011-10-20 DIAGNOSIS — R4589 Other symptoms and signs involving emotional state: Secondary | ICD-10-CM

## 2011-10-20 MED ORDER — SERTRALINE HCL 50 MG PO TABS: 50.0000 mg | ORAL_TABLET | Freq: Every day | ORAL | Status: DC

## 2011-10-20 NOTE — Progress Notes (Signed)
  Subjective:    Patient ID: Debbie Shaw, female    DOB: 24-May-1971, 41 y.o.   MRN: [id number]  HPI Patient here for recheck  She just saw a gastroenterologist and he d/c the omeprazole and started her on dexilant.  She still occasionally feels that she is having palpitations when her reflux acts up.  The gastroenterologist offered to send her to a cardiologist, but previously cardiovascular work-ups have been normal, so she decided to decline for now. The main things that are bothering her is her tendency toward fatalistic thinking.  She obsessively worries that she will die in her sleep and leave her daughter without a mother.  She is participating in self-care such as massages, pedicures, and spending time with friends.  She is continuing to see her therapist and exercising.  The patient states her husband doesn't tend to believe that anxiety is a true health problem. She has been leery of taking anyy SSRI or anti-anxiety medications.  She denies any SI/HI.  She has good friend and family support.  She grew up [place] and believes in God.    Review of Systems  All other systems reviewed and are negative.       Objective:   Physical Exam  Nursing note and vitals reviewed. Constitutional: She is oriented to person, place, and time. She appears well-developed and well-nourished.       Patient often tearful  Neck: Normal range of motion. Neck supple.  Cardiovascular: Normal rate, regular rhythm, normal heart sounds and intact distal pulses.  Exam reveals no gallop and no friction rub.   No murmur heard. Pulmonary/Chest: Effort normal and breath sounds normal.  Neurological: She is alert and oriented to person, place, and time.  Skin: Skin is warm and dry.  Psychiatric: She has a normal mood and affect. Her behavior is normal. Judgment and thought content normal.          Assessment & Plan:  Anxiety-start zoloft. Start w 1/2 tab daily X 1week then up to 1 daily.    ?Palpitations-believe related to #1 but if this doesn't improve on the zoloft and her new reflux med, I will refer to cardiology and have her set up to wear a holter monitor.  She does not wish to do that at this time.  In the meantime, she understands and agrees she will call 911 if she has any worsening/new chest/heart symptoms.  See me in 5-6 weeks.  Continue exercise and self-care measures, along with counseling. Spent 50 mins face to face

## 2011-10-21 ENCOUNTER — Telehealth: Payer: Self-pay

## 2011-10-21 NOTE — Telephone Encounter (Signed)
This could possibly be due to Zoloft. She should be taking 1/2 a pill.  Also she could try taking this in the a.m and if still not tolerating recommend follow up to discuss other alternatives

## 2011-10-21 NOTE — Telephone Encounter (Signed)
PT STATES Debbie Shaw PRESCRIBED ZOLOFT FOR HER YESTERDAY,TOOK FIRST DOSE LAST NIGHT AND WOKE UP IN THE MIDDLE OF NIGHT,AGITATED,PANIC STRICKEN,ETC, IS THIS RELATED TO THE ZOLOFT???   BEST PHONE [phone]  [facility] [address]

## 2011-10-22 ENCOUNTER — Ambulatory Visit (AMBULATORY_SURGERY_CENTER): Payer: No Typology Code available for payment source | Admitting: Gastroenterology

## 2011-10-22 ENCOUNTER — Encounter: Payer: Self-pay | Admitting: Gastroenterology

## 2011-10-22 VITALS — BP 126/70 | HR 76 | Temp 98.5°F | Resp 16 | Ht 69.0 in | Wt 172.0 lb

## 2011-10-22 DIAGNOSIS — K2990 Gastroduodenitis, unspecified, without bleeding: Secondary | ICD-10-CM

## 2011-10-22 DIAGNOSIS — R079 Chest pain, unspecified: Secondary | ICD-10-CM

## 2011-10-22 DIAGNOSIS — R1013 Epigastric pain: Secondary | ICD-10-CM

## 2011-10-22 DIAGNOSIS — K2970 Gastritis, unspecified, without bleeding: Secondary | ICD-10-CM

## 2011-10-22 MED ORDER — SODIUM CHLORIDE 0.9 % IV SOLN: 500.0000 mL | INTRAVENOUS | Status: DC

## 2011-10-22 NOTE — Progress Notes (Addendum)
Pt states she gets nauseated and has reactions to pain meds like night mares and poor tolerance. ewm  Pt states she has severe anxiety. Just started treatment. ewm  Patient did not experience any of the following events: a burn prior to discharge; a fall within the facility; wrong site/side/patient/procedure/implant event; or a hospital transfer or hospital admission upon discharge from the facility. [phone]) Patient did not have preoperative order for IV antibiotic SSI prophylaxis. [phone])

## 2011-10-22 NOTE — Patient Instructions (Signed)
YOU HAD AN ENDOSCOPIC PROCEDURE TODAY AT THE [facility]: Refer to the procedure report that was given to you for any specific questions about what was found during the examination.  If the procedure report does not answer your questions, please call your gastroenterologist to clarify.  If you requested that your care partner not be given the details of your procedure findings, then the procedure report has been included in a sealed envelope for you to review at your convenience later.  YOU SHOULD EXPECT: Some feelings of bloating in the abdomen. Passage of more gas than usual.  Walking can help get rid of the air that was put into your GI tract during the procedure and reduce the bloating. If you had a lower endoscopy (such as a colonoscopy or flexible sigmoidoscopy) you may notice spotting of blood in your stool or on the toilet paper. If you underwent a bowel prep for your procedure, then you may not have a normal bowel movement for a few days.  DIET: Your first meal following the procedure should be a light meal and then it is ok to progress to your normal diet.  A half-sandwich or bowl of soup is an example of a good first meal.  Heavy or fried foods are harder to digest and may make you feel nauseous or bloated.  Likewise meals heavy in dairy and vegetables can cause extra gas to form and this can also increase the bloating.  Drink plenty of fluids but you should avoid alcoholic beverages for 24 hours.  ACTIVITY: Your care partner should take you home directly after the procedure.  You should plan to take it easy, moving slowly for the rest of the day.  You can resume normal activity the day after the procedure however you should NOT DRIVE or use heavy machinery for 24 hours (because of the sedation medicines used during the test).    SYMPTOMS TO REPORT IMMEDIATELY: A gastroenterologist can be reached at any hour.  During normal business hours, 8:30 AM to 5:00 PM Monday through Friday,  call [phone].  After hours and on weekends, please call the GI answering service at [phone] who will take a message and have the physician on call contact you.  Following upper endoscopy (EGD)  Vomiting of blood or coffee ground material  New chest pain or pain under the shoulder blades  Painful or persistently difficult swallowing  New shortness of breath  Fever of 100F or higher  Black, tarry-looking stools  FOLLOW UP: If any biopsies were taken you will be contacted by phone or by letter within the next 1-3 weeks.  Call your gastroenterologist if you have not heard about the biopsies in 3 weeks.  Our staff will call the home number listed on your records the next business day following your procedure to check on you and address any questions or concerns that you may have at that time regarding the information given to you following your procedure. This is a courtesy call and so if there is no answer at the home number and we have not heard from you through the emergency physician on call, we will assume that you have returned to your regular daily activities without incident.  SIGNATURES/CONFIDENTIALITY: You and/or your care partner have signed paperwork which will be entered into your electronic medical record.  These signatures attest to the fact that that the information above on your After Visit Summary has been reviewed and is understood.  Full responsibility of   the confidentiality of this discharge information lies with you and/or your care-partner. 

## 2011-10-22 NOTE — Op Note (Signed)
[facility] [address] [place], [place]  [address]  ENDOSCOPY PROCEDURE REPORT  PATIENT:  Debbie, Shaw  MR#:  [id number] BIRTHDATE:  1971-02-25, 40 yrs. old  GENDER:  female NDOSCOPIST:  Judie Petit T. Russella Dar, MD, [facility] Referred by:  Georgian Co, [place] PROCEDURE DATE:  10/22/2011 PROCEDURE:  EGD with biopsy, 43239 ASA CLASS:  Class II INDICATIONS:  epigastric pain MEDICATIONS:    These medications were titrated to patient response per physician's verbal order, Fentanyl 75 mcg IV, Versed 9 mg IV TOPICAL ANESTHETIC:  Cetacaine Spray DESCRIPTION OF PROCEDURE:   After the risks benefits and alternatives of the procedure were thoroughly explained, informed consent was obtained.  The LB [id number]-H180 [id number] endoscope was introduced through the mouth and advanced to the second portion of the duodenum, without limitations.  The instrument was slowly withdrawn as the mucosa was fully examined. <<PROCEDUREIMAGES>> Mild gastritis was found in the total stomach. It was erythematous and patchy. Biopsies of the antrum and body of the stomach were obtained and sent to pathology.  Otherwise normal stomach.  The esophagus and gastroesophageal junction were completely normal in appearance.  The duodenal bulb was normal in appearance, as was the postbulbar duodenum. Retroflexed views revealed no abnormalities. The scope was then withdrawn from the patient and the procedure completed.  COMPLICATIONS:  None  ENDOSCOPIC IMPRESSION: 1) Mild gastric erythema-R/O gastritis  RECOMMENDATIONS: 1) Anti-reflux regimen 2) Await pathology results 3) continue PPI  Skilynn Durney T. Russella Dar, MD, [facility]  n. eSIGNED:   Venita Lick. Melvenia Favela at 10/22/2011 12:00 PM  Lezlie Lye, [phone]

## 2011-10-23 NOTE — Telephone Encounter (Signed)
PT STATES THAT HEART IS RACING AND SHE IS ALSO NOW HAVING DIARRHEA.  ADVISED PT TO RTC.

## 2011-10-25 ENCOUNTER — Telehealth: Payer: Self-pay | Admitting: *Deleted

## 2011-10-25 NOTE — Telephone Encounter (Signed)
  Follow up Call-  Call back number 10/22/2011  Post procedure Call Back phone  # [phone]  Permission to leave phone message Yes     Patient questions:  Do you have a fever, pain , or abdominal swelling? no Pain Score  0 *  Have you tolerated food without any problems?No, states nausea, but not related to procedure.  Have you been able to return to your normal activities? yes  Do you have any questions about your discharge instructions: Diet   no Medications  no Follow up visit  no  Do you have questions or concerns about your Care? no  Actions: * If pain score is 4 or above: No action needed, pain <4.

## 2011-10-26 ENCOUNTER — Encounter: Payer: Self-pay | Admitting: Gastroenterology

## 2011-11-01 ENCOUNTER — Telehealth: Payer: Self-pay | Admitting: Gastroenterology

## 2011-11-01 ENCOUNTER — Telehealth: Payer: Self-pay

## 2011-11-01 DIAGNOSIS — R1013 Epigastric pain: Secondary | ICD-10-CM

## 2011-11-01 DIAGNOSIS — R079 Chest pain, unspecified: Secondary | ICD-10-CM

## 2011-11-01 MED ORDER — DEXLANSOPRAZOLE 60 MG PO CPDR: 60.0000 mg | DELAYED_RELEASE_CAPSULE | Freq: Every day | ORAL | Status: DC

## 2011-11-01 NOTE — Telephone Encounter (Signed)
Gave patient results as per letter mailed on 10/27/11.

## 2011-11-01 NOTE — Telephone Encounter (Signed)
Pharmacy states that patient was supposed to get Dexilant from our office but no script was sent in. I have sent Dexilant to patient's pharmacy.

## 2011-11-29 ENCOUNTER — Ambulatory Visit (INDEPENDENT_AMBULATORY_CARE_PROVIDER_SITE_OTHER): Payer: No Typology Code available for payment source | Admitting: Cardiovascular Disease

## 2011-11-29 ENCOUNTER — Encounter: Payer: Self-pay | Admitting: Cardiovascular Disease

## 2011-11-29 VITALS — BP 103/73 | HR 75 | Ht 68.5 in | Wt 167.1 lb

## 2011-11-29 DIAGNOSIS — Z8249 Family history of ischemic heart disease and other diseases of the circulatory system: Secondary | ICD-10-CM

## 2011-11-29 DIAGNOSIS — R079 Chest pain, unspecified: Secondary | ICD-10-CM

## 2011-11-29 LAB — HEPATIC FUNCTION PANEL
ALT: 200 U/L — ABNORMAL HIGH (ref 0–35)
AST: 97 U/L — ABNORMAL HIGH (ref 0–37)
Albumin: 4.4 g/dL (ref 3.5–5.2)

## 2011-11-29 LAB — LIPID PANEL
HDL: 45.4 mg/dL (ref >39.00)
Total CHOL/HDL Ratio: 4
Triglycerides: 56 mg/dL (ref 0.0–149.0)
VLDL: 11.2 mg/dL (ref 0.0–40.0)

## 2011-11-29 LAB — BASIC METABOLIC PANEL
CO2: 29 mEq/L (ref 19–32)
Calcium: 9 mg/dL (ref 8.4–10.5)
Potassium: 3.8 mEq/L (ref 3.5–5.1)
Sodium: 138 mEq/L (ref 135–145)

## 2011-11-29 NOTE — Patient Instructions (Addendum)
Your physician recommends that you schedule a follow-up appointment in: 2 months   Your physician recommends that you return for a FASTING lipid profile: today

## 2011-11-29 NOTE — Assessment & Plan Note (Signed)
Debbie Shaw presented today with some atypical episodes of chest discomfort. I think a lot of her problems were related to stress. We drew some baseline labs and it revealed that her AST and her ALT were very elevated. ALT equals 200. AST equals 97.  I think this may may be the explanation of her fatigue and general pains. She's not been on any new medications. I will have her decrease her Zoloft to one half a tablet a day. We may need to try her on a different SSRI.  I discussed the case with Chel Jeffries at urgent medical care.  She will arrange followup for repeat labs and she'll followup with Georgian Co.  I will see  her again in 2 months.

## 2011-11-29 NOTE — Progress Notes (Signed)
Debbie Shaw Date of Birth  09-24-1970       [facility] Office [address]  [address] [place], [place]  [address]   [place], [place]  [address] [phone]     [phone]   Fax  [phone]    Fax [phone]  Problem List: 1. Anxiety 2. Chest pain 3. costochondritis   History of Present Illness:  Debbie Shaw is a 41 yo who presents for evaluation of chest pain and anxiety.  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    Her chest pains occur at various times.  She has been diagnosed with costochondritis after moving boxes.   She has also  noticed these pains with stressful events and change of positioin (bending over).  She does not sleep well.  She has lots of palpitations - especially at night.  She gets short of breath with walking ( usually when she is waking and talking at the same time)  Current Outpatient Prescriptions on File Prior to Visit  Medication Sig Dispense Refill  . ALPRAZolam (XANAX) 0.5 MG tablet Take 0.5 mg by mouth as needed.      Marland Kitchen dexlansoprazole (DEXILANT) 60 MG capsule Take 1 capsule (60 mg total) by mouth daily.  30 capsule  2  . ibuprofen (ADVIL,MOTRIN) 200 MG tablet Take 200 mg by mouth as needed.      . loratadine (CLARITIN) 10 MG tablet Take 10 mg by mouth daily.      . Multiple Vitamin (MULTIVITAMIN) tablet Take 1 tablet by mouth daily.      . sertraline (ZOLOFT) 50 MG tablet Take 1 tablet (50 mg total) by mouth daily.  30 tablet  2  . hyoscyamine (LEVSIN/SL) 0.125 MG SL tablet Place 1 tablet (0.125 mg total) under the tongue every 4 (four) hours as needed for cramping.  30 tablet  11    Allergies  Allergen Reactions  . Morphine And Related Nausea And Vomiting  . 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 Sulfa Antibiotics Nausea And Vomiting    Past Medical History  Diagnosis Date  . Allergy   .  Asthma     as child  . Anxiety   . Panic attacks     Past Surgical History  Procedure Date  . Cholecystectomy   . Knee arthroscopy     bilateral, multiple times  . Knee arthroscopy w/ acl reconstruction     left    History  Smoking status  . Never Smoker   Smokeless tobacco  . Never Used    History  Alcohol Use  . Yes    social    Family History  Problem Relation Age of Onset  . Colon cancer Maternal Grandfather   . Irritable bowel syndrome Maternal Uncle     x 3  . Irritable bowel syndrome Cousin   . Heart disease Maternal Grandmother   . Thyroid disease Mother     Reviw of Systems:  Reviewed in the HPI.  All other systems are negative.  Physical Exam: Blood pressure 103/73, pulse 75, height 5' 8.5" (1.74 m), weight 167 lb 1.9 oz (75.805 kg). General: Well developed, well nourished, in no acute distress.  Head: Normocephalic, atraumatic, sclera non-icteric, mucus membranes are moist,   Neck: Supple. Carotids are 2 + without bruits. No JVD  Lungs: Clear bilaterally  to auscultation.  Heart: regular rate.  normal  S1 S2. No murmurs, gallops or rubs.  Abdomen: Soft, non-tender, non-distended with normal bowel sounds. No hepatomegaly. No rebound/guarding. No masses.  Msk:  Strength and tone are normal  Extremities: No clubbing or cyanosis. No edema.  Distal pedal pulses are 2+ and equal bilaterally.  Neuro: Alert and oriented X 3. Moves all extremities spontaneously.  Psych:  Responds to questions appropriately with a normal affect.  ECG: September 21, 2011 - NSR, no St or T wave changes.  Assessment / Plan:

## 2011-12-01 ENCOUNTER — Telehealth: Payer: Self-pay

## 2011-12-01 NOTE — Telephone Encounter (Signed)
Debbie Shaw I'm forwarding this to you as you are currently at 104.

## 2011-12-01 NOTE — Telephone Encounter (Signed)
The patient called to report that she saw cardiologist yesterday and he stated that her medications were affecting her liver.  She would like to talk to Georgian Co [facility] regarding her Zoloft and Dexilant 60mg .  The patient is wondering if she should take the Dexilant Rx today or not.  She is concerned about what medicine may be affecting her liver and would like a return call from Georgian Co [facility] asap.  Best phone number for patient is [phone].

## 2011-12-08 ENCOUNTER — Encounter: Payer: Self-pay | Admitting: Family Medicine

## 2011-12-08 ENCOUNTER — Ambulatory Visit (INDEPENDENT_AMBULATORY_CARE_PROVIDER_SITE_OTHER): Payer: No Typology Code available for payment source | Admitting: Physician Assistant

## 2011-12-08 VITALS — BP 98/66 | HR 69 | Temp 98.5°F | Resp 16 | Ht 68.5 in | Wt 164.0 lb

## 2011-12-08 DIAGNOSIS — R1011 Right upper quadrant pain: Secondary | ICD-10-CM

## 2011-12-08 DIAGNOSIS — R748 Abnormal levels of other serum enzymes: Secondary | ICD-10-CM

## 2011-12-08 MED ORDER — SERTRALINE HCL 50 MG PO TABS: 50.0000 mg | ORAL_TABLET | Freq: Every day | ORAL | Status: DC

## 2011-12-08 MED ORDER — ALPRAZOLAM 0.5 MG PO TABS: 0.5000 mg | ORAL_TABLET | ORAL | Status: DC | PRN

## 2011-12-08 NOTE — Progress Notes (Signed)
  Subjective:    Patient ID: Debbie Shaw, female    DOB: Oct 24, 1970, 41 y.o.   MRN: [id number]  HPI I have reviewed labs from last week as well as notes by Dr. Elease Hashimoto and Dr. Russella Dar.  Patient was found to have elevated LFTs last week (after having normal LFTs 09/2011 and before).  She remained on her 50mg  zoloft.  She stopped the dexilant 6 days ago.  She has noticed since stopping the dexilant that her appetite is better and she is experiencing less nausea.  She denies abdominal pain.  She denies any skin discoloration or pruritis. She eats a very healthy diet and lots of fruits.  She does not think she has ever been immunized against Hepatitis B.  She had a possible exposure to Hepatitis C about 10 years ago when she was divorcing her husband of about 1 year after finding out he was using IV drugs and was diagnosed with Hep. C.  She had Hepatitis testing then (10 yrs ago) and 6 months after the exposure and all of her testing was negative at that time.  She does not have any history of high risk behaviors or IV drug use.  She is trying to follow a reflux diet closely. She has about 1 glass of wine per month and hasn't even had that in more than several weeks.  Since last visit and starting her on Zoloft, she is making great progress.  She is feeling "more like myself."  She is feeling much less anxious.  Her obsessive thoughts are improving.  1 bottle of xanax has lasted her almost a year.  Review of Systems  All other systems reviewed and are negative.       Objective:   Physical Exam  Vitals reviewed. Constitutional: She is oriented to person, place, and time. She appears well-developed and well-nourished.  HENT:  Head: Normocephalic and atraumatic.  Eyes: Conjunctivae and EOM are normal. Pupils are equal, round, and reactive to light.       sclera non-icteric B  Cardiovascular: Normal rate, regular rhythm and normal heart sounds.   Pulmonary/Chest: Effort normal and breath sounds  normal.  Abdominal: Soft. Bowel sounds are normal. She exhibits no distension and no mass. There is tenderness (she does exhibit mild tenderness over her liver which does not seem enlarged at this time.). There is no rebound and no guarding.       No splenomegaly  Neurological: She is alert and oriented to person, place, and time.  Skin: Skin is warm and dry.       No jaundice  Psychiatric: She has a normal mood and affect. Her behavior is normal. Thought content normal.      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  I think LFTs likely elevated due to dexilant as hepatic dysfunction is listed as a side effect.  Remain off of Dexilant.  Although she has only been off the dexilant for 6 days, I am repeating LFTs today mostly to make sure they are not continuing to increase despite cessation of the likely causative agent. We will plan on rechecking the LFTs in another 3-4 weeks to ensure resolution. Anxiety and Depression-stable on Zoloft!!  Tentative plan is to stay on this for a minimum of 6 months while she continues to practice self-care and counseling. She may need to be on something long-term, but we will make that decision later.

## 2011-12-09 ENCOUNTER — Telehealth: Payer: Self-pay | Admitting: Cardiovascular Disease

## 2011-12-09 LAB — HEPATIC FUNCTION PANEL
Alkaline Phosphatase: 47 U/L (ref 39–117)
Bilirubin, Direct: 0.1 mg/dL (ref 0.0–0.3)
Indirect Bilirubin: 0.7 mg/dL (ref 0.0–0.9)
Total Protein: 6.8 g/dL (ref 6.0–8.3)

## 2011-12-09 LAB — HEPATITIS B SURFACE ANTIGEN: Hepatitis B Surface Ag: NEGATIVE

## 2011-12-09 LAB — HEPATITIS C ANTIBODY: HCV Ab: NEGATIVE

## 2012-01-05 ENCOUNTER — Encounter: Payer: No Typology Code available for payment source | Admitting: Physician Assistant

## 2012-02-02 ENCOUNTER — Ambulatory Visit (INDEPENDENT_AMBULATORY_CARE_PROVIDER_SITE_OTHER): Payer: No Typology Code available for payment source | Admitting: Physician Assistant

## 2012-02-02 ENCOUNTER — Encounter: Payer: Self-pay | Admitting: Physician Assistant

## 2012-02-02 VITALS — BP 113/76 | HR 64 | Temp 97.5°F | Resp 16 | Ht 68.0 in | Wt 168.0 lb

## 2012-02-02 DIAGNOSIS — Z01419 Encounter for gynecological examination (general) (routine) without abnormal findings: Secondary | ICD-10-CM

## 2012-02-02 DIAGNOSIS — Z0000 Encounter for general adult medical examination without abnormal findings: Secondary | ICD-10-CM

## 2012-02-02 DIAGNOSIS — F411 Generalized anxiety disorder: Secondary | ICD-10-CM

## 2012-02-02 LAB — CBC WITH DIFFERENTIAL/PLATELET
Basophils Relative: 1 % (ref 0–1)
Eosinophils Absolute: 0.4 10*3/uL (ref 0.0–0.7)
Eosinophils Relative: 5 % (ref 0–5)
HCT: 40.4 % (ref 36.0–46.0)
Hemoglobin: 13.8 g/dL (ref 12.0–15.0)
Lymphs Abs: 1.8 10*3/uL (ref 0.7–4.0)
MCH: 30.3 pg (ref 26.0–34.0)
MCHC: 34.2 g/dL (ref 30.0–36.0)
MCV: 88.6 fL (ref 78.0–100.0)
Monocytes Absolute: 0.6 10*3/uL (ref 0.1–1.0)
Monocytes Relative: 7 % (ref 3–12)
Neutrophils Relative %: 66 % (ref 43–77)
RBC: 4.56 MIL/uL (ref 3.87–5.11)

## 2012-02-02 LAB — HEPATIC FUNCTION PANEL
ALT: 15 U/L (ref 0–35)
AST: 17 U/L (ref 0–37)
Albumin: 4.4 g/dL (ref 3.5–5.2)
Alkaline Phosphatase: 41 U/L (ref 39–117)
Total Bilirubin: 0.9 mg/dL (ref 0.3–1.2)

## 2012-02-02 LAB — POCT URINALYSIS DIPSTICK
Bilirubin, UA: NEGATIVE
Blood, UA: NEGATIVE
Glucose, UA: NEGATIVE
Ketones, UA: NEGATIVE
Leukocytes, UA: NEGATIVE
Nitrite, UA: NEGATIVE

## 2012-02-02 MED ORDER — SERTRALINE HCL 50 MG PO TABS: 50.0000 mg | ORAL_TABLET | Freq: Every day | ORAL | Status: DC

## 2012-02-02 MED ORDER — FLUTICASONE PROPIONATE 50 MCG/ACT NA SUSP: 2.0000 | Freq: Every day | NASAL | Status: DC

## 2012-02-03 ENCOUNTER — Encounter: Payer: Self-pay | Admitting: Physician Assistant

## 2012-02-03 LAB — PAP IG W/ RFLX HPV ASCU

## 2012-02-03 NOTE — Progress Notes (Signed)
Subjective:    Patient ID: Debbie Shaw, female    DOB: 1971/02/26, 41 y.o.   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  We also need to repeat her liver enzymes today to ensure they are remaining normal after having stopped dexilant.   Anxiety and depression still much improved on 50mg  of zoloft.  She is practicing self-care, exercising regularly, practicing good sleep hygiene.  Prior to starting the meds, she was unable to deal with things effectively. It has greatly improved her quality of life.  Review of Systems  All other systems reviewed and are negative.       Objective:   Physical Exam  Nursing note and vitals reviewed. Constitutional: She is oriented to person, place, and time. She appears well-developed and well-nourished.  HENT:  Head: Normocephalic and atraumatic.  Right Ear: External ear normal.  Left Ear: External ear normal.  Nose: Nose normal.  Mouth/Throat: Oropharynx is clear and moist. No oropharyngeal exudate.  Eyes: Conjunctivae normal and EOM are normal. Pupils are equal, round, and reactive to light.  Neck: Normal range of motion. Neck supple. No thyromegaly present.  Cardiovascular: Normal rate, regular rhythm, normal heart sounds and intact distal pulses.  Exam reveals no gallop.   No murmur heard. Pulmonary/Chest: Effort normal and breath sounds normal. She exhibits tenderness (costochondral tenderness upper R chest mid to proximal sternum, just R of midline) and bony tenderness. Right breast exhibits no inverted nipple, no mass, no nipple discharge, no skin change and no tenderness. Left breast exhibits inverted nipple. Left breast exhibits no mass, no nipple discharge, no skin change and no tenderness. Breasts are symmetrical.  Abdominal: Soft. Bowel sounds are normal.  Genitourinary: Vagina normal and uterus normal. No vaginal discharge found.       Cervix friable, otherwise WNL, pap smear taken. Bimanual exam unremarkable.    Musculoskeletal: Normal range of motion.  Lymphadenopathy:    She has no cervical adenopathy.  Neurological: She is alert and oriented to person, place, and time. She has normal reflexes. No cranial nerve deficit.  Skin: Skin is warm and dry.  Psychiatric: She has a normal mood and affect. Her behavior is normal. Judgment and thought content normal.    Results for orders placed in visit on 02/02/12  CBC WITH DIFFERENTIAL      Component Value Range   WBC 8.6  4.0 - 10.5 K/uL   RBC 4.56  3.87 - 5.11 MIL/uL   Hemoglobin 13.8  12.0 - 15.0 g/dL   HCT 40.9  81.1 - 91.4 %   MCV 88.6  78.0 - 100.0 fL   MCH 30.3  26.0 - 34.0 pg   MCHC 34.2  30.0 - 36.0 g/dL   RDW 78.2  95.6 - 21.3 %   Platelets 295  150 - 400 K/uL   Neutrophils Relative 66  43 - 77 %   Neutro Abs 5.7  1.7 - 7.7 K/uL   Lymphocytes Relative 21  12 - 46 %   Lymphs Abs 1.8  0.7 - 4.0 K/uL   Monocytes Relative 7  3 - 12 %   Monocytes Absolute 0.6  0.1 - 1.0 K/uL   Eosinophils Relative 5  0 - 5 %   Eosinophils Absolute 0.4  0.0 - 0.7 K/uL   Basophils Relative 1  0 - 1 %   Basophils Absolute 0.1  0.0 - 0.1 K/uL   Smear Review Criteria for review not met    HEPATIC FUNCTION  PANEL      Component Value Range   Total Bilirubin 0.9  0.3 - 1.2 mg/dL   Bilirubin, Direct 0.2  0.0 - 0.3 mg/dL   Indirect Bilirubin 0.7  0.0 - 0.9 mg/dL   Alkaline Phosphatase 41  39 - 117 U/L   AST 17  0 - 37 U/L   ALT 15  0 - 35 U/L   Total Protein 6.7  6.0 - 8.3 g/dL   Albumin 4.4  3.5 - 5.2 g/dL  POCT URINALYSIS DIPSTICK      Component Value Range   Color, UA yellow     Clarity, UA clear     Glucose, UA neg     Bilirubin, UA neg     Ketones, UA neg     Spec Grav, UA 1.015     Blood, UA neg     pH, UA 7.0     Protein, UA neg     Urobilinogen, UA 0.2     Nitrite, UA neg     Leukocytes, UA Negative           Assessment & Plan:  CPE-normal exam Costochondritis-handout given Elevated LFTs-continues to be resolved off of  dexilant. Anticipatory guidance Anxiety/depression-stable on zoloft.  Continue xanax prn and ok to RF this regularly for the next 9 months.  She has been reliable and responsible with this and usu 1 bottle lasts her several months.

## 2012-02-08 ENCOUNTER — Ambulatory Visit: Payer: No Typology Code available for payment source | Admitting: Cardiovascular Disease

## 2012-04-04 NOTE — Telephone Encounter (Signed)
Opened in error

## 2012-05-20 IMAGING — CR DG CHEST 2V
2 series · 2 of 2 positions shown · non-contrast
Comparison: None.

CLINICAL DATA: Chest pain.  Nausea.  Hypertension.  Dizziness.

CHEST - 2 VIEW

[w chest pa *]
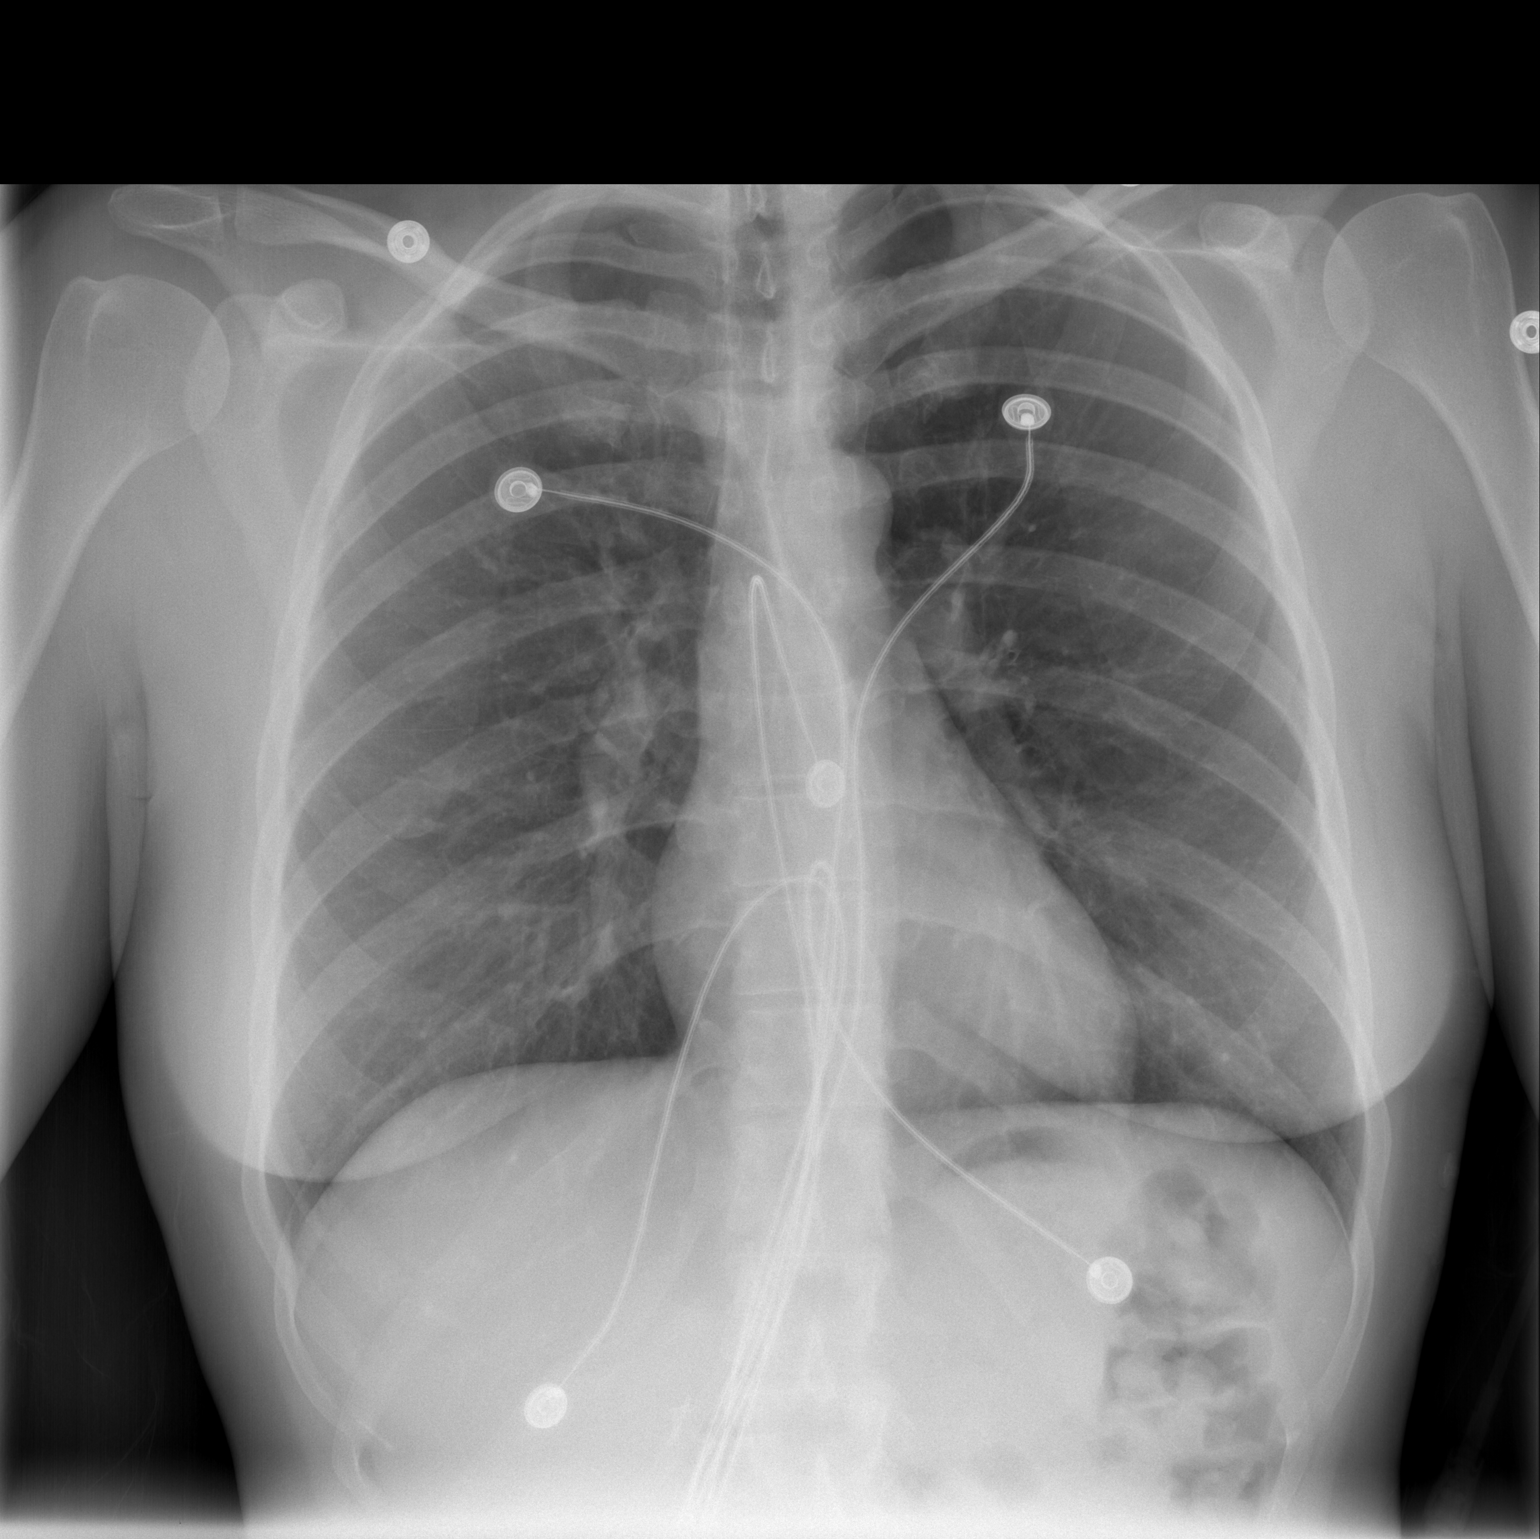

[w chest lat *]
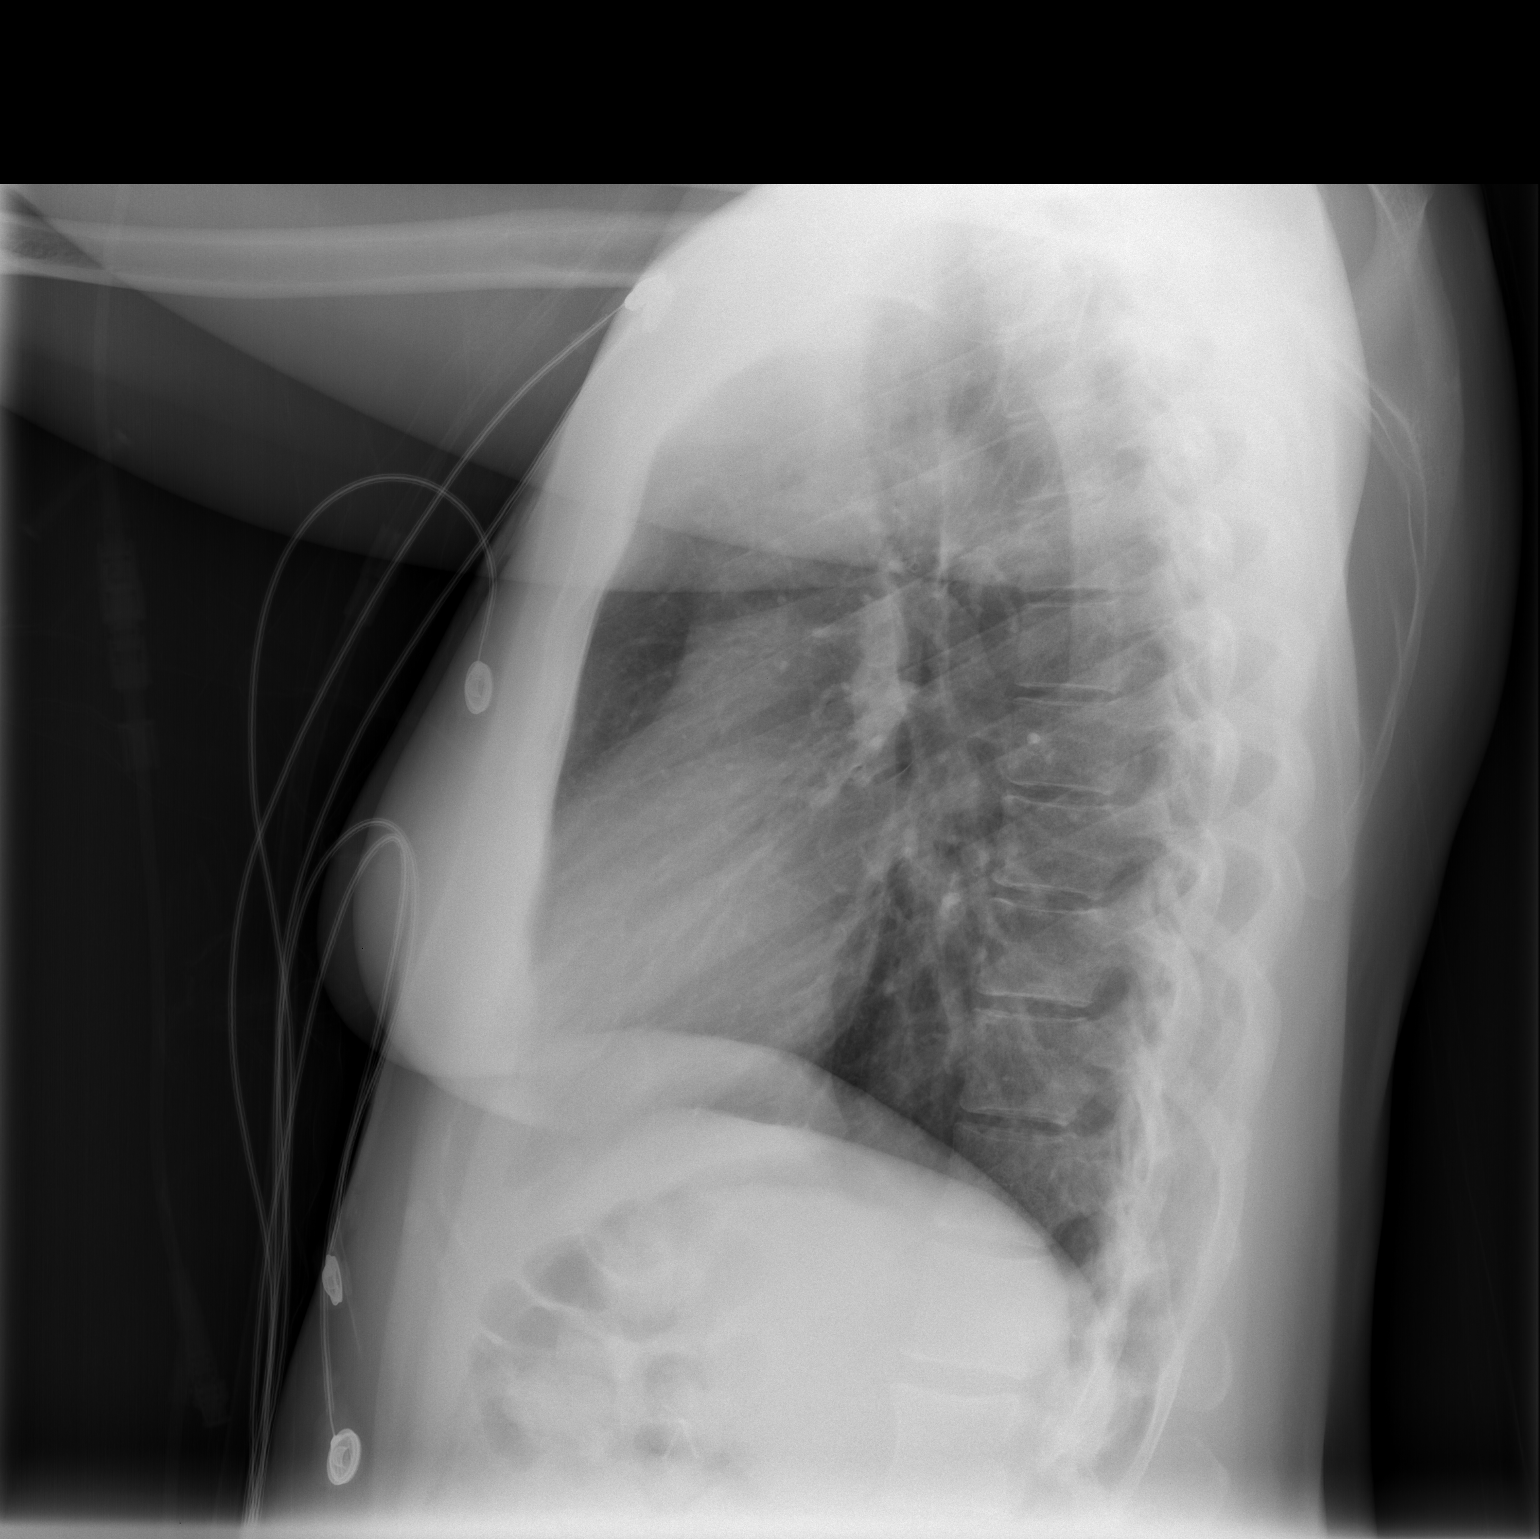

[2 of 2 positions shown; findings below may reference images not displayed]

FINDINGS: The heart size and mediastinal contours are within
normal limits.  Both lungs are clear.  The visualized skeletal
structures are unremarkable.
IMPRESSION: No active cardiopulmonary disease.

## 2012-09-29 ENCOUNTER — Encounter: Payer: Self-pay | Admitting: Physician Assistant

## 2013-01-30 ENCOUNTER — Ambulatory Visit (INDEPENDENT_AMBULATORY_CARE_PROVIDER_SITE_OTHER): Payer: No Typology Code available for payment source | Admitting: Physician Assistant

## 2013-01-30 ENCOUNTER — Encounter: Payer: Self-pay | Admitting: Physician Assistant

## 2013-01-30 VITALS — BP 115/78 | HR 75 | Temp 98.5°F | Resp 16 | Ht 68.0 in | Wt 177.0 lb

## 2013-01-30 DIAGNOSIS — F411 Generalized anxiety disorder: Secondary | ICD-10-CM

## 2013-01-30 DIAGNOSIS — F419 Anxiety disorder, unspecified: Secondary | ICD-10-CM | POA: Insufficient documentation

## 2013-01-30 DIAGNOSIS — Z23 Encounter for immunization: Secondary | ICD-10-CM

## 2013-01-30 DIAGNOSIS — J309 Allergic rhinitis, unspecified: Secondary | ICD-10-CM

## 2013-01-30 MED ORDER — SERTRALINE HCL 50 MG PO TABS: 50.0000 mg | ORAL_TABLET | Freq: Every day | ORAL | Status: DC

## 2013-01-30 MED ORDER — FLUTICASONE PROPIONATE 50 MCG/ACT NA SUSP: 2.0000 | Freq: Every day | NASAL | Status: DC

## 2013-01-30 NOTE — Progress Notes (Signed)
  Subjective:    Patient ID: Debbie Shaw, female    DOB: 1970/07/02, 42 y.o.   MRN: [id number]  HPI  This 42 y.o. female presents for evaluation of anxiety.  Has been taking sertraline 50 mg, with prn alprazolam, for about 18 months.  Started it, reluctantly, when she was in a very stressful situation at home, when her step-daughters were returning to live with their mother after living with their father and her exclusively for over a year.  She has always been "an over achiever" but was having frequent panic attacks.  She has employed a number of methods to help her relax: Yoga, breathing techniques, etc.  She would like to stop the sertraline if it's advisable.  She notes that she'd like to "go ahead and do it now, before the winter, since I get a little down then anyway." Uses alprazolam 1-2 times/month.  Medications, allergies, past medical history, surgical history, family history, social history and problem list reviewed.   Review of Systems As above.  Has had some increased allergy symptoms: post nasal drainage, congestion, and needing to frequently clear her throat. Denies chest pain, shortness of breath, HA, dizziness, vision change, nausea, vomiting, diarrhea, constipation, melena, hematochezia, dysuria, increased urinary urgency or frequency, increased hunger or thirst, unintentional weight change, unexplained myalgias or arthralgias, rash.     Objective:   Physical Exam  Blood pressure 115/78, pulse 75, temperature 98.5 F (36.9 C), temperature source Oral, resp. rate 16, height 5\' 8"  (1.727 m), weight 177 lb (80.287 kg), last menstrual period 12/30/2012, SpO2 98.00%. Body mass index is 26.92 kg/(m^2). Well-developed, well nourished WF who is awake, alert and oriented, in NAD. HEENT: [place]/AT, sclera and conjunctiva are clear.   Heart: Regular rate Lungs: normal effort Skin: warm and dry. Psychologic: good mood and appropriate affect, normal speech and behavior.      Assessment & Plan:  Anxiety - Plan: sertraline (ZOLOFT) 50 MG tablet; discussed tapering and discontinuing this medication.  Encouraged her to wait until springtime.  Allergic rhinitis - Plan: fluticasone (FLONASE) 50 MCG/ACT nasal spray  Need for prophylactic vaccination and inoculation against influenza - Plan: Flu vaccine greater than or equal to 3yo preservative free IM  Fernande Bras, PA-C Physician Assistant-Certified [facility]

## 2013-01-30 NOTE — Patient Instructions (Signed)
To taper off the sertraline, reduce the dose to 25 mg (1/2 tablet) daily for 2-4 weeks, then stop it.

## 2013-09-04 LAB — HM MAMMOGRAPHY

## 2013-09-10 ENCOUNTER — Encounter: Payer: Self-pay | Admitting: Physician Assistant

## 2013-10-09 ENCOUNTER — Other Ambulatory Visit: Payer: Self-pay | Admitting: Physician Assistant

## 2013-11-22 ENCOUNTER — Other Ambulatory Visit: Payer: Self-pay | Admitting: Orthopedic Surgery

## 2013-11-22 DIAGNOSIS — R079 Chest pain, unspecified: Secondary | ICD-10-CM

## 2013-11-28 ENCOUNTER — Other Ambulatory Visit: Payer: No Typology Code available for payment source

## 2013-11-30 ENCOUNTER — Ambulatory Visit
Admission: RE | Admit: 2013-11-30 | Discharge: 2013-11-30 | Disposition: A | Discharge status: Home / Self Care | Payer: No Typology Code available for payment source | Source: Ambulatory Visit | Attending: Orthopedic Surgery | Admitting: Orthopedic Surgery

## 2013-11-30 DIAGNOSIS — R079 Chest pain, unspecified: Secondary | ICD-10-CM

## 2014-01-18 ENCOUNTER — Ambulatory Visit (INDEPENDENT_AMBULATORY_CARE_PROVIDER_SITE_OTHER): Payer: No Typology Code available for payment source | Admitting: Family Medicine

## 2014-01-18 ENCOUNTER — Encounter: Payer: Self-pay | Admitting: Family Medicine

## 2014-01-18 VITALS — BP 101/65 | HR 64 | Temp 97.9°F | Resp 16 | Ht 68.5 in | Wt 162.0 lb

## 2014-01-18 DIAGNOSIS — Z0000 Encounter for general adult medical examination without abnormal findings: Secondary | ICD-10-CM

## 2014-01-18 DIAGNOSIS — F411 Generalized anxiety disorder: Secondary | ICD-10-CM

## 2014-01-18 DIAGNOSIS — Z124 Encounter for screening for malignant neoplasm of cervix: Secondary | ICD-10-CM

## 2014-01-18 DIAGNOSIS — G4700 Insomnia, unspecified: Secondary | ICD-10-CM

## 2014-01-18 DIAGNOSIS — Z136 Encounter for screening for cardiovascular disorders: Secondary | ICD-10-CM

## 2014-01-18 DIAGNOSIS — M940 Chondrocostal junction syndrome [Tietze]: Secondary | ICD-10-CM

## 2014-01-18 DIAGNOSIS — Z1239 Encounter for other screening for malignant neoplasm of breast: Secondary | ICD-10-CM

## 2014-01-18 DIAGNOSIS — Z13220 Encounter for screening for lipoid disorders: Secondary | ICD-10-CM

## 2014-01-18 LAB — COMPREHENSIVE METABOLIC PANEL
ALBUMIN: 4.5 g/dL (ref 3.5–5.2)
ALT: 12 U/L (ref 0–35)
AST: 16 U/L (ref 0–37)
Alkaline Phosphatase: 37 U/L — ABNORMAL LOW (ref 39–117)
BILIRUBIN TOTAL: 1 mg/dL (ref 0.2–1.2)
BUN: 15 mg/dL (ref 6–23)
CO2: 29 mEq/L (ref 19–32)
Calcium: 9.4 mg/dL (ref 8.4–10.5)
Chloride: 105 mEq/L (ref 96–112)
Creat: 0.87 mg/dL (ref 0.50–1.10)
Glucose, Bld: 81 mg/dL (ref 70–99)
POTASSIUM: 4.3 meq/L (ref 3.5–5.3)
Sodium: 141 mEq/L (ref 135–145)
Total Protein: 7.1 g/dL (ref 6.0–8.3)

## 2014-01-18 LAB — CBC WITH DIFFERENTIAL/PLATELET
BASOS ABS: 0 10*3/uL (ref 0.0–0.1)
BASOS PCT: 0 % (ref 0–1)
EOS ABS: 0.5 10*3/uL (ref 0.0–0.7)
Eosinophils Relative: 8 % — ABNORMAL HIGH (ref 0–5)
HEMATOCRIT: 41.6 % (ref 36.0–46.0)
Hemoglobin: 14.2 g/dL (ref 12.0–15.0)
Lymphocytes Relative: 20 % (ref 12–46)
Lymphs Abs: 1.3 10*3/uL (ref 0.7–4.0)
MCH: 30.3 pg (ref 26.0–34.0)
MCHC: 34.1 g/dL (ref 30.0–36.0)
MCV: 88.9 fL (ref 78.0–100.0)
Monocytes Absolute: 0.4 10*3/uL (ref 0.1–1.0)
Monocytes Relative: 6 % (ref 3–12)
NEUTROS ABS: 4.2 10*3/uL (ref 1.7–7.7)
Neutrophils Relative %: 66 % (ref 43–77)
Platelets: 273 10*3/uL (ref 150–400)
RBC: 4.68 MIL/uL (ref 3.87–5.11)
RDW: 12.8 % (ref 11.5–15.5)
WBC: 6.3 10*3/uL (ref 4.0–10.5)

## 2014-01-18 LAB — LIPID PANEL
Cholesterol: 179 mg/dL (ref 0–200)
HDL: 51 mg/dL (ref >39)
LDL Cholesterol: 111 mg/dL — ABNORMAL HIGH (ref 0–99)
Total CHOL/HDL Ratio: 3.5 Ratio
Triglycerides: 83 mg/dL (ref <150)
VLDL: 17 mg/dL (ref 0–40)

## 2014-01-18 LAB — TSH: TSH: 1.82 u[IU]/mL (ref 0.350–4.500)

## 2014-01-18 MED ORDER — ALPRAZOLAM 0.5 MG PO TABS: 0.5000 mg | ORAL_TABLET | ORAL | Status: DC | PRN

## 2014-01-18 NOTE — Patient Instructions (Signed)
Start taking calcium with vitamin D supplement once a day - do not combine with calcium rich foods. Find a calcium supplement with 400-600mg  and as much vitamin D as you can.  Keeping You Healthy  Get These Tests 1. Blood Pressure- Have your blood pressure checked once a year by your health care provider.  Normal blood pressure is 120/80. 2. Weight- Have your body mass index (BMI) calculated to screen for obesity.  BMI is measure of body fat based on height and weight.  You can also calculate your own BMI at GravelBags.it. 3. Cholesterol- Have your cholesterol checked every 5 years starting at age 3 then yearly starting at age 26. 28. Chlamydia, HIV, and other sexually transmitted diseases- Get screened every year until age 66, then within three months of each new sexual provider. 5. Pap Smear- Every 1-3 years; discuss with your health care provider. 6. Mammogram- Every year starting at age 56  Take these medicines  Calcium with Vitamin D-Your body needs 1200 mg of Calcium each day and [phone] IU of Vitamin D daily.  Your body can only absorb 500 mg of Calcium at a time so Calcium must be taken in 2 or 3 divided doses throughout the day.  Multivitamin with folic acid- Once daily if it is possible for you to become pregnant.  Get these Immunizations  Gardasil-Series of three doses; prevents HPV related illness such as genital warts and cervical cancer.  Menactra-Single dose; prevents meningitis.  Tetanus shot- Every 10 years.  Flu shot-Every year.  Take these steps 1. Do not smoke-Your healthcare provider can help you quit.  For tips on how to quit go to www.smokefree.gov or call 1-800 QUITNOW. 2. Be physically active- Exercise 5 days a week for at least 30 minutes.  If you are not already physically active, start slow and gradually work up to 30 minutes of moderate physical activity.  Examples of moderate activity include walking briskly, dancing, swimming, bicycling,  etc. 3. Breast Cancer- A self breast exam every month is important for early detection of breast cancer.  For more information and instruction on self breast exams, ask your healthcare provider or https://www.patel.info/. 4. Eat a healthy diet- Eat a variety of healthy foods such as fruits, vegetables, whole grains, low fat milk, low fat cheeses, yogurt, lean meats, poultry and fish, beans, nuts, tofu, etc.  For more information go to www. Thenutritionsource.org 5. Drink alcohol in moderation- Limit alcohol intake to one drink or less per day. Never drink and drive. 6. Depression- Your emotional health is as important as your physical health.  If you're feeling down or losing interest in things you normally enjoy please talk to your healthcare provider about being screened for depression. 7. Dental visit- Brush and floss your teeth twice daily; visit your dentist twice a year. 8. Eye doctor- Get an eye exam at least every 2 years. 9. Helmet use- Always wear a helmet when riding a bicycle, motorcycle, rollerblading or skateboarding. 33. Safe sex- If you may be exposed to sexually transmitted infections, use a condom. 11. Seat belts- Seat belts can save your live; always wear one. 12. Smoke/Carbon Monoxide detectors- These detectors need to be installed on the appropriate level of your home. Replace batteries at least once a year. 13. Skin cancer- When out in the sun please cover up and use sunscreen 15 SPF or higher. 14. Violence- If anyone is threatening or hurting you, please tell your healthcare provider.

## 2014-01-18 NOTE — Progress Notes (Signed)
   Subjective:    Patient ID: Debbie Shaw, female    DOB: [birth year], 43 y.o.   MRN: [id number]  HPI    Review of Systems  Constitutional: Negative.   HENT: Negative.   Eyes: Negative.   Respiratory: Positive for chest tightness.   Cardiovascular: Positive for chest pain.  Endocrine: Negative.   Genitourinary: Negative.   Musculoskeletal: Negative.   Skin: Negative.   Allergic/Immunologic: Negative.   Neurological: Negative.   Hematological: Negative.   Psychiatric/Behavioral: Positive for sleep disturbance. The patient is nervous/anxious.        Objective:   Physical Exam        Assessment & Plan:

## 2014-01-18 NOTE — Progress Notes (Signed)
Subjective:  This chart was scribed for Delman Cheadle, MD by Donato Schultz, Medical Scribe. This patient was seen in Room 28 and the patient's care was started at 11:26 AM.   Patient ID: Debbie Shaw, female    DOB: Oct 14, 1970, 43 y.o.   MRN: [id number]  Chief Complaint  Patient presents with  . Annual Exam    HPI HPI Comments: Debbie Shaw is a 43 y.o. female who presents to the [facility] for an annual exam and a refill of her Xanax.  Her last CPE was two years ago.  She has been using Xanax responsibly - using infrequently for several years.  She started yoga breathing techniques and discontinued her Zoloft within the last year.  She only uses the Xanax 1-2 times per month.  She had a mammogram at [facility] in April 2015 which was normal.  Last Pap was normal, negative in September 2013 so due to repeat in 1 year.  Did have some elevated LFTs in the past but this was attributed to [place].  She is no longer taking Dexilant.  She will take a multivitamin sporadically.  She has been having trouble sleeping for that past 10 days which she attributes to her anxiety.  She does not have any urinary symptoms.  She went to [facility] and saw Dr. Tempie Hoist recently for a swelling, sore, lump in her chest and had an CT scan of her chest.  She was told that there was nothing they could do due to the connective tissue.  Using her arms aggravates the pain and has prevented her from working out.  She will see a chiropractor that will massage the area and ice the area with temporary relief.  She was recommended to see a PT by her sister-in-law.  When she had the CT scan, she was told that she has "crystals" in her lungs that might be [place]  She had an abnormal pap smear within the last 10 years and had a biopsy for HPV.  She does not take any multivitamins or calcium supplements.  Her last TDAP was 7 years ago.  Her maternal  grandfather has a history of colon cancer and died when he was in his 29s.  Her mother has been getting colonoscopies regularly which have all been normal.    Past Medical History  Diagnosis Date  . Allergy   . Asthma     as child  . Anxiety   . Panic attacks    Past Surgical History  Procedure Laterality Date  . Cholecystectomy    . Knee arthroscopy      bilateral, multiple times  . Knee arthroscopy w/ acl reconstruction      left  . Tonsillectomy     Family History  Problem Relation Age of Onset  . Colon cancer Maternal Grandfather   . Irritable bowel syndrome Maternal Uncle     x 3  . Irritable bowel syndrome Cousin   . Heart disease Maternal Grandmother   . Thyroid disease Mother    History   Social History  . Marital Status: Married    Spouse Name: N/A    Number of Children: 3  . Years of Education: N/A   Occupational History  . artist    Social History Main Topics  . Smoking status: Never Smoker   . Smokeless tobacco: Never Used  . Alcohol Use: Yes     Comment: social  . Drug  Use: No  . Sexual Activity: Yes    Birth Control/ Protection: Surgical   Other Topics Concern  . Not on file   Social History Narrative   Lives with her husband and their daughter.  They share custody of his two daughters with their mother.   Allergies  Allergen Reactions  . Morphine And Related Nausea And Vomiting  . 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 Sulfa Antibiotics Nausea And Vomiting    Review of Systems  Endocrine: Negative for polyuria.  Genitourinary: Negative for dysuria, urgency, frequency, hematuria, enuresis and difficulty urinating.  Psychiatric/Behavioral: Positive for sleep disturbance. The patient is nervous/anxious.   see above note   Objective:  BP 101/65  Pulse 64  Temp(Src) 97.9 F (36.6 C) (Oral)  Resp 16  Ht 5' 8.5" (1.74 m)  Wt 162 lb (73.483 kg)  BMI 24.27 kg/m2  SpO2 100%   LMP 01/16/2014 Physical Exam  Nursing note and vitals reviewed. Constitutional: She is oriented to person, place, and time. She appears well-developed and well-nourished.  HENT:  Head: Normocephalic and atraumatic.  Right Ear: External ear normal.  Left Ear: External ear normal.  Nose: Nose normal.  Mouth/Throat: Oropharynx is clear and moist. No oropharyngeal exudate.  Eyes: EOM are normal.  Neck: Normal range of motion. Neck supple. No thyromegaly present.  Cardiovascular: Normal rate, regular rhythm and normal heart sounds.  Exam reveals no gallop and no friction rub.   No murmur heard. Pulmonary/Chest: Effort normal and breath sounds normal. No respiratory distress. She has no wheezes. She has no rales. She exhibits mass, tenderness and bony tenderness. She exhibits no crepitus. Right breast exhibits no inverted nipple, no mass, no nipple discharge, no skin change and no tenderness. Left breast exhibits no inverted nipple, no mass, no nipple discharge, no skin change and no tenderness. Breasts are symmetrical.    Abdominal: Soft. Bowel sounds are normal. There is no tenderness.  Musculoskeletal: Normal range of motion.  Lymphadenopathy:    She has no cervical adenopathy.  Neurological: She is alert and oriented to person, place, and time.  Skin: Skin is warm and dry.  Psychiatric: She has a normal mood and affect. Her behavior is normal.    Assessment & Plan:  Routine general medical examination at a health care facility  Screening for cervical cancer - Plan: Pap IG w/ reflex to HPV when ASC-U - Recommended that the patient do a Pap smear today due to her history of an abnormal Pap.   Screening for breast cancer  Costochondritis - Plan: Vit D  25 hydroxy (rtn osteoporosis monitoring) - Discussed reviewing the patient's CT scan from [facility] and recommended she see a physical therapist.   Advised the patient to take Vitamin D and a 400-$RemoveB'600mg'LFjkpnMv$  calcium supplement  daily.   CT scan in epic does not show any bony abnormality despite firm palpable tender mass along upper right costo-sternal junction.  Anxiety state, unspecified - Advised the patient to pay attention to her moods and discussed the possibility of restarting her Zoloft temporarily.    Insomnia - Plan: CBC with Differential, TSH, Comprehensive metabolic panel  Encounter for lipid screening for cardiovascular disease - Plan: Lipid panel  Meds ordered this encounter  Medications  . ALPRAZolam (XANAX) 0.5 MG tablet    Sig: Take 1 tablet (0.5 mg total) by mouth as needed.    Dispense:  30 tablet  Refill:  0    I personally performed the services described in this documentation, which was scribed in my presence. The recorded information has been reviewed and considered, and addended by me as needed.  Delman Cheadle, MD MPH   Results for orders placed in visit on 01/18/14  CBC WITH DIFFERENTIAL      Result Value Ref Range   WBC 6.3  4.0 - 10.5 K/uL   RBC 4.68  3.87 - 5.11 MIL/uL   Hemoglobin 14.2  12.0 - 15.0 g/dL   HCT 41.6  36.0 - 46.0 %   MCV 88.9  78.0 - 100.0 fL   MCH 30.3  26.0 - 34.0 pg   MCHC 34.1  30.0 - 36.0 g/dL   RDW 12.8  11.5 - 15.5 %   Platelets 273  150 - 400 K/uL   Neutrophils Relative % 66  43 - 77 %   Neutro Abs 4.2  1.7 - 7.7 K/uL   Lymphocytes Relative 20  12 - 46 %   Lymphs Abs 1.3  0.7 - 4.0 K/uL   Monocytes Relative 6  3 - 12 %   Monocytes Absolute 0.4  0.1 - 1.0 K/uL   Eosinophils Relative 8 (*) 0 - 5 %   Eosinophils Absolute 0.5  0.0 - 0.7 K/uL   Basophils Relative 0  0 - 1 %   Basophils Absolute 0.0  0.0 - 0.1 K/uL   Smear Review Criteria for review not met    LIPID PANEL      Result Value Ref Range   Cholesterol 179  0 - 200 mg/dL   Triglycerides 83  <150 mg/dL   HDL 51  >39 mg/dL   Total CHOL/HDL Ratio 3.5     VLDL 17  0 - 40 mg/dL   LDL Cholesterol 111 (*) 0 - 99 mg/dL  VITAMIN D 25 HYDROXY      Result Value Ref Range   Vit D, 25-Hydroxy 56  30  - 89 ng/mL  TSH      Result Value Ref Range   TSH 1.820  0.350 - 4.500 uIU/mL  COMPREHENSIVE METABOLIC PANEL      Result Value Ref Range   Sodium 141  135 - 145 mEq/L   Potassium 4.3  3.5 - 5.3 mEq/L   Chloride 105  96 - 112 mEq/L   CO2 29  19 - 32 mEq/L   Glucose, Bld 81  70 - 99 mg/dL   BUN 15  6 - 23 mg/dL   Creat 0.87  0.50 - 1.10 mg/dL   Total Bilirubin 1.0  0.2 - 1.2 mg/dL   Alkaline Phosphatase 37 (*) 39 - 117 U/L   AST 16  0 - 37 U/L   ALT 12  0 - 35 U/L   Total Protein 7.1  6.0 - 8.3 g/dL   Albumin 4.5  3.5 - 5.2 g/dL   Calcium 9.4  8.4 - 10.5 mg/dL

## 2014-01-19 LAB — VITAMIN D 25 HYDROXY (VIT D DEFICIENCY, FRACTURES): VIT D 25 HYDROXY: 56 ng/mL (ref 30–89)

## 2014-01-20 ENCOUNTER — Encounter: Payer: Self-pay | Admitting: Family Medicine

## 2014-01-20 NOTE — Addendum Note (Signed)
Addended by: Delman Cheadle on: 01/20/2014 04:40 PM   Modules accepted: Orders

## 2014-01-22 LAB — PAP IG W/ RFLX HPV ASCU

## 2014-01-25 ENCOUNTER — Encounter: Payer: Self-pay | Admitting: Family Medicine

## 2014-10-17 LAB — HM MAMMOGRAPHY: HM Mammogram: NEGATIVE

## 2014-10-30 ENCOUNTER — Encounter: Payer: Self-pay | Admitting: *Deleted

## 2015-01-03 ENCOUNTER — Ambulatory Visit (INDEPENDENT_AMBULATORY_CARE_PROVIDER_SITE_OTHER): Payer: No Typology Code available for payment source | Admitting: Internal Medicine

## 2015-01-03 VITALS — BP 120/70 | HR 74 | Temp 98.1°F | Resp 14 | Ht 68.5 in | Wt 168.0 lb

## 2015-01-03 DIAGNOSIS — J029 Acute pharyngitis, unspecified: Secondary | ICD-10-CM | POA: Diagnosis not present

## 2015-01-03 DIAGNOSIS — B002 Herpesviral gingivostomatitis and pharyngotonsillitis: Secondary | ICD-10-CM | POA: Diagnosis not present

## 2015-01-03 LAB — POCT RAPID STREP A (OFFICE): Rapid Strep A Screen: NEGATIVE

## 2015-01-03 MED ORDER — BENZONATATE 100 MG PO CAPS: 100.0000 mg | ORAL_CAPSULE | Freq: Three times a day (TID) | ORAL | Status: DC | PRN

## 2015-01-03 MED ORDER — VALACYCLOVIR HCL 1 G PO TABS: 2000.0000 mg | ORAL_TABLET | Freq: Two times a day (BID) | ORAL | Status: DC

## 2015-01-03 NOTE — Patient Instructions (Addendum)
Herpes Simplex Herpes simplex is generally classified as Type 1 or Type 2. Type 1 is generally the type that is responsible for cold sores. Type 2 is generally associated with sexually transmitted diseases. We now know that most of the thoughts on these viruses are inaccurate. We find that HSV1 is also present genitally and HSV2 can be present orally, but this will vary in different locations of the world. Herpes simplex is usually detected by doing a culture. Blood tests are also available for this virus; however, the accuracy is often not as good.  PREPARATION FOR TEST No preparation or fasting is necessary. NORMAL FINDINGS  No virus present  No HSV antigens or antibodies present Ranges for normal findings may vary among different laboratories and hospitals. You should always check with your doctor after having lab work or other tests done to discuss the meaning of your test results and whether your values are considered within normal limits. MEANING OF TEST  Your caregiver will go over the test results with you and discuss the importance and meaning of your results, as well as treatment options and the need for additional tests if necessary. OBTAINING THE TEST RESULTS  It is your responsibility to obtain your test results. Ask the lab or department performing the test when and how you will get your results. Document Released: 06/12/2004 Document Revised: 08/02/2011 Document Reviewed: 04/20/2008 [facility] Patient Information 2015 [place], [place]. This information is not intended to replace advice given to you by your health care provider. Make sure you discuss any questions you have with your health care provider.

## 2015-01-03 NOTE — Progress Notes (Signed)
MRN: [id number] DOB: 12/31/1970  Subjective:   Debbie Shaw is a 44 y.o. female presenting for chief complaint of Sore Throat and Strep test on weds was neg  Reports 2 day history of sore throat without a cough. Reports white spots in her mouth, right sided neck pain. Has tried otc medications without relief, taking ibuprofen for her neck pain with some help. Of note, [facility] and [facility]rep was negative. She also has a history of fever blisters since childhood, these have resolved with topical medication. Today, she is concerned that she has strep throat and would like to be checked for this. Denies fever, ear pain, ear drainage, itchy or red eyes, sinus pain, sinus congestion, chest pain, chest tightness, wheezing, shob, n/v, abdominal pain. Denies smoking. Denies any other aggravating or relieving factors, no other questions or concerns.  Debbie Shaw has a current medication list which includes the following prescription(s): alprazolam, ibuprofen, and melatonin. Also is allergic to morphine and related; other; and sulfa antibiotics.  Debbie Shaw  has a past medical history of Allergy; Asthma; Anxiety; Panic attacks; and Depression. Also  has past surgical history that includes Cholecystectomy; Knee arthroscopy; Knee arthroscopy w/ ACL reconstruction; and Tonsillectomy.  Objective:   Vitals: BP 120/70 mmHg  Pulse 74  Temp(Src) 98.1 F (36.7 C) (Oral)  Resp 14  Ht 5' 8.5" (1.74 m)  Wt 168 lb (76.204 kg)  BMI 25.17 kg/m2  SpO2 99%  Physical Exam  Constitutional: She is oriented to person, place, and time. She appears well-developed and well-nourished.  HENT:  TM's intact bilaterally, no effusions or erythema. Nares patent, nasal turbinates pink and moist, nasal passages patent. No sinus tenderness. Oropharynx with multiple vesicular lesions on erythematous base over soft palate but sparing the uvula. Tonsils not enlarged, no other erythema or exudates.  Eyes:  Conjunctivae are normal. Right eye exhibits no discharge. Left eye exhibits no discharge. No scleral icterus.  Neck: Normal range of motion.  Cardiovascular: Normal rate, regular rhythm and intact distal pulses.  Exam reveals no gallop and no friction rub.   No murmur heard. Pulmonary/Chest: No respiratory distress. She has no wheezes. She has no rales.  Lymphadenopathy:    She has cervical adenopathy (anterior, right-sided).  Neurological: She is alert and oriented to person, place, and time.  Skin: Skin is warm and dry. No rash noted. No erythema. No pallor.   Results for orders placed or performed in visit on 01/03/15 (from the past 24 hour(s))  POCT rapid strep A     Status: Normal   Collection Time: 01/03/15 11:48 AM  Result Value Ref Range   Rapid Strep A Screen Negative Negative   Assessment and Plan :   1. Oral herpes simplex infection 2. Sore throat - Cultures pending, will start patient on Valtrex 2,000mg  BID today. Discussed HSV infection at length with patient. I do believe that this is what she has but if patient has no improvement in 1 week, rtc. - Offered benzonatate perles for sore throat.  Debbie Eagles, PA-C [facility] [phone] 01/03/2015 11:27 AM   Meds ordered this encounter  Medications  . valACYclovir (VALTREX) 1000 MG tablet    Sig: Take 2 tablets (2,000 mg total) by mouth 2 (two) times daily.    Dispense:  4 tablet    Refill:  0    Order Specific Question:  Supervising Provider    Answer:  Shawnee Knapp [  [id number]]  . benzonatate (TESSALON) 100 MG capsule    Sig: Take 1-2 capsules (100-200 mg total) by mouth 3 (three) times daily as needed for cough.    Dispense:  40 capsule    Refill:  0    Order Specific Question:  Supervising Provider    Answer:  Brigitte Pulse, EVA N [[id number]]   I have participated in the care of this patient with the Advanced Practice Provider and agree with Diagnosis and Plan as documented. Robert P.  Laney Pastor, M.D.

## 2015-01-04 LAB — CULTURE, GROUP A STREP: Organism ID, Bacteria: NORMAL

## 2015-01-08 LAB — HERPES SIMPLEX VIRUS CULTURE: ORGANISM ID, BACTERIA: DETECTED

## 2015-01-30 ENCOUNTER — Ambulatory Visit (INDEPENDENT_AMBULATORY_CARE_PROVIDER_SITE_OTHER): Payer: No Typology Code available for payment source | Admitting: Family Medicine

## 2015-01-30 ENCOUNTER — Encounter: Payer: Self-pay | Admitting: Family Medicine

## 2015-01-30 VITALS — BP 105/70 | HR 69 | Temp 98.4°F | Resp 16 | Ht 68.5 in | Wt 167.0 lb

## 2015-01-30 DIAGNOSIS — Z0000 Encounter for general adult medical examination without abnormal findings: Secondary | ICD-10-CM | POA: Diagnosis not present

## 2015-01-30 DIAGNOSIS — Z113 Encounter for screening for infections with a predominantly sexual mode of transmission: Secondary | ICD-10-CM

## 2015-01-30 DIAGNOSIS — M940 Chondrocostal junction syndrome [Tietze]: Secondary | ICD-10-CM

## 2015-01-30 DIAGNOSIS — Z130 Encounter for screening for diseases of the blood and blood-forming organs and certain disorders involving the immune mechanism: Secondary | ICD-10-CM | POA: Diagnosis not present

## 2015-01-30 DIAGNOSIS — Z13220 Encounter for screening for lipoid disorders: Secondary | ICD-10-CM

## 2015-01-30 DIAGNOSIS — Z1329 Encounter for screening for other suspected endocrine disorder: Secondary | ICD-10-CM

## 2015-01-30 DIAGNOSIS — Z136 Encounter for screening for cardiovascular disorders: Secondary | ICD-10-CM

## 2015-01-30 DIAGNOSIS — M545 Low back pain, unspecified: Secondary | ICD-10-CM

## 2015-01-30 DIAGNOSIS — G4700 Insomnia, unspecified: Secondary | ICD-10-CM | POA: Diagnosis not present

## 2015-01-30 DIAGNOSIS — F419 Anxiety disorder, unspecified: Secondary | ICD-10-CM | POA: Diagnosis not present

## 2015-01-30 DIAGNOSIS — B002 Herpesviral gingivostomatitis and pharyngotonsillitis: Secondary | ICD-10-CM

## 2015-01-30 DIAGNOSIS — Z1389 Encounter for screening for other disorder: Secondary | ICD-10-CM

## 2015-01-30 DIAGNOSIS — B001 Herpesviral vesicular dermatitis: Secondary | ICD-10-CM

## 2015-01-30 DIAGNOSIS — Z1383 Encounter for screening for respiratory disorder NEC: Secondary | ICD-10-CM

## 2015-01-30 DIAGNOSIS — Z23 Encounter for immunization: Secondary | ICD-10-CM

## 2015-01-30 LAB — CBC
HEMATOCRIT: 38 % (ref 36.0–46.0)
Hemoglobin: 13 g/dL (ref 12.0–15.0)
MCH: 31.1 pg (ref 26.0–34.0)
MCHC: 34.2 g/dL (ref 30.0–36.0)
MCV: 90.9 fL (ref 78.0–100.0)
MPV: 9.5 fL (ref 8.6–12.4)
PLATELETS: 234 10*3/uL (ref 150–400)
RBC: 4.18 MIL/uL (ref 3.87–5.11)
RDW: 13 % (ref 11.5–15.5)
WBC: 6.4 10*3/uL (ref 4.0–10.5)

## 2015-01-30 LAB — TSH: TSH: 1.65 u[IU]/mL (ref 0.350–4.500)

## 2015-01-30 MED ORDER — VALACYCLOVIR HCL 1 G PO TABS: 2000.0000 mg | ORAL_TABLET | Freq: Two times a day (BID) | ORAL | Status: DC

## 2015-01-30 MED ORDER — ALPRAZOLAM 0.5 MG PO TABS: 0.5000 mg | ORAL_TABLET | ORAL | Status: DC | PRN

## 2015-01-30 NOTE — Patient Instructions (Signed)

## 2015-01-30 NOTE — Progress Notes (Signed)
Subjective:    Patient ID: Debbie Shaw, female    DOB: [birth year], 44 y.o.   MRN: [id number]  Chief Complaint  Patient presents with  . Annual Exam    HPI  HPI Comments: Debbie Shaw is a 44 y.o. female who presents to the [facility] for an annual exam.  Her last CPE was last yr with me.  We did her pap at that time.  She has had a mammogram at [facility] in April 2015 which was normal. She had an abnormal pap smear prior with colposcopy/biopsy but has had sev neg/norm paps since.  She does not take any multivitamins or calcium supplements.  Her last TDAP was 8 years ago.   Her maternal grandfather has a history of colon cancer and died when he was in his 66s.  Her mother has been getting colonoscopies regularly which have all been normal.      Did have some elevated LFTs in the past but this was attributed to [place].  She is no longer taking Dexilant.   She will take a multivitamin sporadically.  Pt has chronic upper costochondritis seen by Dr. Robinette Haines at [facility] yrs prior.  She was told that there was nothing they could do due to the connective tissue.  Using her arms aggravates the pain.  She has seen a chiropractor and [place] [facility] PT which some partial relief. They showed her some exercises with a theraband which she does sometime with some partial relief. Occ msk back pain.  Feeling achy - trying to go to sleep earlier at night.  Has been using some z-quil - only has no sleep 2 night out a month occ uses xanax - 1/2 at a time - has only used 20 tabs in the past year and this has been consistent for several yrs.  Knows that when she develops the chest tightness she needs to take the 1/2 tab xanax    Past Medical History  Diagnosis Date  . Allergy   . Asthma     as child  . Anxiety   . Panic attacks   . Depression    Past Surgical History  Procedure Laterality Date  . Cholecystectomy    . Knee arthroscopy      bilateral, multiple times  . Knee  arthroscopy w/ acl reconstruction      left  . Tonsillectomy     Family History  Problem Relation Age of Onset  . Colon cancer Maternal Grandfather   . Irritable bowel syndrome Maternal Uncle     x 3  . Irritable bowel syndrome Cousin   . Heart disease Maternal Grandmother   . Thyroid disease Mother   . Hyperlipidemia Mother    Social History   Social History  . Marital Status: Married    Spouse Name: N/A  . Number of Children: 3  . Years of Education: N/A   Occupational History  . artist    Social History Main Topics  . Smoking status: Never Smoker   . Smokeless tobacco: Never Used  . Alcohol Use: Yes     Comment: social 1-4  . Drug Use: No  . Sexual Activity: Yes    Birth Control/ Protection: Surgical   Other Topics Concern  . Not on file   Social History Narrative   Lives with her husband and their daughter.  They share custody of his two daughters with their mother. Education: [employer].    Allergies  Allergen Reactions  .  Morphine And Related Nausea And Vomiting  . 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 Sulfa Antibiotics Nausea And Vomiting    Review of Systems  Endocrine: Negative for polyuria.  Genitourinary: Negative for dysuria, urgency, frequency, hematuria, enuresis and difficulty urinating.  Musculoskeletal: Positive for back pain and arthralgias. Negative for gait problem.  Psychiatric/Behavioral: Positive for sleep disturbance. The patient is nervous/anxious.   All other systems reviewed and are negative. see above note   Objective:  BP 105/70 mmHg  Pulse 69  Temp(Src) 98.4 F (36.9 C)  Resp 16  Ht 5' 8.5" (1.74 m)  Wt 167 lb (75.751 kg)  BMI 25.02 kg/m2  LMP 01/16/2015  Physical Exam  Constitutional: She is oriented to person, place, and time. She appears well-developed and well-nourished.  HENT:  Head: Normocephalic and atraumatic.  Right Ear: External ear normal.  Left  Ear: External ear normal.  Nose: Nose normal.  Mouth/Throat: Oropharynx is clear and moist. No oropharyngeal exudate.  Eyes: EOM are normal.  Neck: Normal range of motion. Neck supple. No thyromegaly present.  Cardiovascular: Normal rate, regular rhythm and normal heart sounds.  Exam reveals no gallop and no friction rub.   No murmur heard. Pulmonary/Chest: Effort normal and breath sounds normal. No respiratory distress. She has no wheezes. She has no rales. She exhibits mass, tenderness and bony tenderness. She exhibits no crepitus. Right breast exhibits no inverted nipple, no mass, no nipple discharge, no skin change and no tenderness. Left breast exhibits no inverted nipple, no mass, no nipple discharge, no skin change and no tenderness. Breasts are symmetrical.    Abdominal: Soft. Bowel sounds are normal. There is no tenderness.  Musculoskeletal: Normal range of motion.  Lymphadenopathy:    She has no cervical adenopathy.  Neurological: She is alert and oriented to person, place, and time.  Skin: Skin is warm and dry.  Psychiatric: She has a normal mood and affect. Her behavior is normal.  Nursing note and vitals reviewed.   Visual Acuity Screening   Right eye Left eye Both eyes  Without correction: 20/20 20/25 20/15   With correction:       Assessment & Plan:   1. Need for immunization against influenza   2. Routine general medical examination at a health care facility   3. Encounter for lipid screening for cardiovascular disease   4. Screening for cardiovascular, respiratory, and genitourinary diseases   5. Screening for deficiency anemia   6. Screening for thyroid disorder   7. Oral herpes simplex infection   8. Screening for STD (sexually transmitted disease) - pt does have h/o abnml pap  9. Herpes labialis   10. Costochondritis - chronic, seen by [place] [facility] prev w/ nml CT. Had PT prior with some improvement so will try sending [facility] for myofascial release   11. Insomnia   12. Anxiety - doing well, has used zoloft prev but no need now.  Using <30 xanax/yr so refilled.  13. Bilateral low back pain without sciatica - try PT w/ [facility]    Orders Placed This Encounter  Procedures  . Flu Vaccine QUAD 36+ mos IM (Fluarix)  . CBC  . Lipid panel    Order Specific Question:  Has the patient fasted?    Answer:  Yes  . TSH  . Comprehensive metabolic panel    Order Specific Question:  Has the patient fasted?    Answer:  Yes  . HIV antibody  . Ambulatory referral to Physical Therapy    Referral Priority:  Routine    Referral Type:  Physical Medicine    Referral Reason:  Specialty Services Required    Requested Specialty:  Physical Therapy    Number of Visits Requested:  1    Meds ordered this encounter  Medications  . ALPRAZolam (XANAX) 0.5 MG tablet    Sig: Take 1 tablet (0.5 mg total) by mouth as needed.    Dispense:  30 tablet    Refill:  0  . valACYclovir (VALTREX) 1000 MG tablet    Sig: Take 2 tablets (2,000 mg total) by mouth 2 (two) times daily. For 1 day    Dispense:  4 tablet    Refill:  3    I personally performed the services described in this documentation, which was scribed in my presence. The recorded information has been reviewed and considered, and addended by me as needed.  Delman Cheadle, MD MPH  Results for orders placed or performed in visit on 01/30/15  CBC  Result Value Ref Range   WBC 6.4 4.0 - 10.5 K/uL   RBC 4.18 3.87 - 5.11 MIL/uL   Hemoglobin 13.0 12.0 - 15.0 g/dL   HCT 38.0 36.0 - 46.0 %   MCV 90.9 78.0 - 100.0 fL   MCH 31.1 26.0 - 34.0 pg   MCHC 34.2 30.0 - 36.0 g/dL   RDW 13.0 11.5 - 15.5 %   Platelets 234 150 - 400 K/uL   MPV 9.5 8.6 - 12.4 fL  Lipid panel  Result Value Ref Range   Cholesterol 171 125 - 200 mg/dL   Triglycerides 71 <150 mg/dL   HDL 42 (L) >=46 mg/dL   Total CHOL/HDL Ratio 4.1 <=5.0 Ratio   VLDL 14 <30 mg/dL   LDL Cholesterol 115 <130 mg/dL  TSH  Result Value Ref  Range   TSH 1.650 0.350 - 4.500 uIU/mL  Comprehensive metabolic panel  Result Value Ref Range   Sodium 140 135 - 146 mmol/L   Potassium 4.5 3.5 - 5.3 mmol/L   Chloride 106 98 - 110 mmol/L   CO2 27 20 - 31 mmol/L   Glucose, Bld 77 65 - 99 mg/dL   BUN 11 7 - 25 mg/dL   Creat 0.80 0.50 - 1.10 mg/dL   Total Bilirubin 0.7 0.2 - 1.2 mg/dL   Alkaline Phosphatase 34 33 - 115 U/L   AST 14 10 - 30 U/L   ALT 10 6 - 29 U/L   Total Protein 6.1 6.1 - 8.1 g/dL   Albumin 4.1 3.6 - 5.1 g/dL   Calcium 8.7 8.6 - 10.2 mg/dL  HIV antibody  Result Value Ref Range   HIV 1&2 Ab, 4th Generation NONREACTIVE NONREACTIVE

## 2015-01-31 ENCOUNTER — Encounter: Payer: No Typology Code available for payment source | Admitting: Family Medicine

## 2015-01-31 LAB — COMPREHENSIVE METABOLIC PANEL
ALK PHOS: 34 U/L (ref 33–115)
ALT: 10 U/L (ref 6–29)
AST: 14 U/L (ref 10–30)
Albumin: 4.1 g/dL (ref 3.6–5.1)
BUN: 11 mg/dL (ref 7–25)
CO2: 27 mmol/L (ref 20–31)
Calcium: 8.7 mg/dL (ref 8.6–10.2)
Chloride: 106 mmol/L (ref 98–110)
Creat: 0.8 mg/dL (ref 0.50–1.10)
Glucose, Bld: 77 mg/dL (ref 65–99)
Potassium: 4.5 mmol/L (ref 3.5–5.3)
Sodium: 140 mmol/L (ref 135–146)
TOTAL PROTEIN: 6.1 g/dL (ref 6.1–8.1)
Total Bilirubin: 0.7 mg/dL (ref 0.2–1.2)

## 2015-01-31 LAB — LIPID PANEL
Cholesterol: 171 mg/dL (ref 125–200)
HDL: 42 mg/dL — AB (ref >=46)
LDL Cholesterol: 115 mg/dL (ref <130)
Total CHOL/HDL Ratio: 4.1 Ratio (ref <=5.0)
Triglycerides: 71 mg/dL (ref <150)
VLDL: 14 mg/dL (ref <30)

## 2015-01-31 LAB — HIV ANTIBODY (ROUTINE TESTING W REFLEX): HIV: NONREACTIVE

## 2015-04-09 ENCOUNTER — Ambulatory Visit (INDEPENDENT_AMBULATORY_CARE_PROVIDER_SITE_OTHER): Payer: No Typology Code available for payment source | Admitting: Family Medicine

## 2015-04-09 ENCOUNTER — Encounter: Payer: Self-pay | Admitting: Physician Assistant

## 2015-04-09 VITALS — BP 107/69 | HR 67 | Temp 98.3°F | Resp 16 | Ht 68.0 in | Wt 172.0 lb

## 2015-04-09 DIAGNOSIS — B001 Herpesviral vesicular dermatitis: Secondary | ICD-10-CM

## 2015-04-09 DIAGNOSIS — Z8619 Personal history of other infectious and parasitic diseases: Secondary | ICD-10-CM

## 2015-04-09 DIAGNOSIS — K648 Other hemorrhoids: Secondary | ICD-10-CM | POA: Diagnosis not present

## 2015-04-09 MED ORDER — VALACYCLOVIR HCL 1 G PO TABS: 2000.0000 mg | ORAL_TABLET | Freq: Two times a day (BID) | ORAL | Status: DC

## 2015-04-09 NOTE — Progress Notes (Signed)
[facility] [address], [place] [address] [phone]  Date:  04/09/2015   Name:  Debbie Shaw   DOB:  01-27-1971   MRN:  [id number]  PCP:  Mikey Kirschner    History of Present Illness:  Debbie Shaw is a 44 y.o. female patient who presents to [facility] for concern of a hemorrhoid, and cold sores. Patient reports that on Monday, she had a pain at her anus.  She could feel a hemorrhoid, and it has a tearing feeling when she touches it.  Hurts with defecation.  She reports no constipation with a BM daily.  No blood in the stool.  Hurts with urination, at her anus, as well.   Hx of hemorrhoids with the birth of daughter 7 years ago.  Topical only in the past.     She also would like a refill of valacyclovir.  She has a hx of cold sores, and she recently has had an outbreak, which she reports is due to stress.  She states that she has had increased anxiety with the stressors of her husband and his ex-wife.  Possibility of court visits.  She notes that she has felt tremulous, had night terrors, with anxiety symptoms.  She states that she has a right sided upper chest pain just beneath the axillary that is associated with her anxiety.  She will take a xanax which will calm her symptoms.   She has been on the Zoloft previously but tapered off.  She states it helped a lot with her anxiety, but she had learned to control it.  However with the strife of husband/ex-wife, exhibition coming up, and family coming into town--she feels overwhelmed.  She reports that she has not had a panic attack lately, as she did with previous anxiety.  She had gone to a counselor, Lynda Rainwater?), but has not followed up since she had better self control of her mood.  No SI/HI.  Increased etOH to 1-2 beers at night--used to calm her down.    Patient Active Problem List   Diagnosis Date Noted  . 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 Anxiety 01/30/2013  . Chest pain 11/29/2011  . ARM  PAIN, LEFT 03/14/2009  . CHOLELITHIASIS 02/10/2009  . FIBROCYSTIC BREAST DISEASE 02/10/2009  . FATIGUE 02/10/2009    Past Medical History  Diagnosis Date  . Allergy   . Asthma     as child  . Anxiety   . Panic attacks   . Depression     Past Surgical History  Procedure Laterality Date  . Cholecystectomy    . Knee arthroscopy      bilateral, multiple times  . Knee arthroscopy w/ acl reconstruction      left  . Tonsillectomy      Social History  Substance Use Topics  . Smoking status: Never Smoker   . Smokeless tobacco: Never Used  . Alcohol Use: Yes     Comment: social 1-4    Family History  Problem Relation Age of Onset  . Colon cancer Maternal Grandfather   . Irritable bowel syndrome Maternal Uncle     x 3  . Irritable bowel syndrome Cousin   . Heart disease Maternal Grandmother   . Thyroid disease Mother   . Hyperlipidemia Mother     Allergies  Allergen Reactions  . Morphine And Related Nausea And Vomiting  . 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 Sulfa Antibiotics Nausea And Vomiting    Medication list has been reviewed and updated.  Current Outpatient Prescriptions on File Prior to Visit  Medication Sig Dispense Refill  . ALPRAZolam (XANAX) 0.5 MG tablet Take 1 tablet (0.5 mg total) by mouth as needed. 30 tablet 0  . ibuprofen (ADVIL,MOTRIN) 200 MG tablet Take 200 mg by mouth as needed.     No current facility-administered medications on file prior to visit.    ROS ROS otherwise unremarkable unless listed above.  Physical Examination: BP 107/69 mmHg  Pulse 67  Temp(Src) 98.3 F (36.8 C) (Oral)  Resp 16  Ht 5\' 8"  (1.727 m)  Wt 172 lb (78.019 kg)  BMI 26.16 kg/m2  SpO2 99%  LMP 04/05/2015 [facility] Weight: Weight in (lb) to have BMI = 25: 164.1  Physical Exam  Constitutional: She is oriented to person, place, and time. She appears well-developed and well-nourished. No  distress.  HENT:  Head: Normocephalic and atraumatic.  Right Ear: External ear normal.  Left Ear: External ear normal.  Eyes: Conjunctivae and EOM are normal. Pupils are equal, round, and reactive to light.  Cardiovascular: Normal rate and regular rhythm.  Exam reveals no gallop and no friction rub.   No murmur heard. Pulses:      Radial pulses are 2+ on the left side.  Pulmonary/Chest: Effort normal. No respiratory distress. She has no decreased breath sounds. She has no wheezes. She has no rhonchi. She exhibits no tenderness, no bony tenderness, no edema and no swelling.  Genitourinary: Rectal exam shows external hemorrhoid (thrombosed hemorrhoid present at the left posterior of anus). Pelvic exam was performed with patient supine.  Neurological: She is alert and oriented to person, place, and time.  Skin: She is not diaphoretic.  Psychiatric: She has a normal mood and affect. Her behavior is normal.   Procedure: Verbal consent obtained.  Thrombosis cleansed with alcohol swab.  1% lidocaine placed for anesthesia.  Swabbed with povidone.  11 blade utilized for a 1 cm incision.  Clots expressed.  Forceps used to sweep for clots with more removed.  Cleansed with normal saline.  Gauze and padding placed for drainage.   Assessment and Plan: Debbie Shaw is a 44 y.o. female who is here today for refill of valacyclovir for present cold sore.  -Hemorrhoid aftercare given, with warm sitz baths. Ibuprofen for pain.  Declines controlled medication. -refill of the valacyclovir.  -I have advised that she return to counseling.  I also offered refill of sertraline.  She declines at this time.  She will attempt to follow up with [facility].  She will contact if she would like to follow up an SSRI.  Advised the benefits of starting an SSRI at this time, as it is slow working medication.  She understands.  She states she has xanax at this time (20 tablets). Advised to stray away from [facility].    H/O cold sores  - Plan: valACYclovir (VALTREX) 1000 MG tablet  Cold sore - Plan: valACYclovir (VALTREX) 1000 MG tablet  Other hemorrhoids   Ivar Drape, PA-C [facility] 04/09/2015 4:11 PM

## 2015-04-09 NOTE — Patient Instructions (Signed)
Please do the warm sitz bath atleast 4 times per day, for 15 minutes.  Keep the area clean, and you can use a pad or sanitary napkin for the bleeding.    Hemorrhoids Hemorrhoids are swollen veins around the rectum or anus. There are two types of hemorrhoids:   Internal hemorrhoids. These occur in the veins just inside the rectum. They may poke through to the outside and become irritated and painful.  External hemorrhoids. These occur in the veins outside the anus and can be felt as a painful swelling or hard lump near the anus. CAUSES  Pregnancy.   Obesity.   Constipation or diarrhea.   Straining to have a bowel movement.   Sitting for long periods on the toilet.  Heavy lifting or other activity that caused you to strain.  Anal intercourse. SYMPTOMS   Pain.   Anal itching or irritation.   Rectal bleeding.   Fecal leakage.   Anal swelling.   One or more lumps around the anus.  DIAGNOSIS  Your caregiver may be able to diagnose hemorrhoids by visual examination. Other examinations or tests that may be performed include:   Examination of the rectal area with a gloved hand (digital rectal exam).   Examination of anal canal using a small tube (scope).   A blood test if you have lost a significant amount of blood.  A test to look inside the colon (sigmoidoscopy or colonoscopy). TREATMENT Most hemorrhoids can be treated at home. However, if symptoms do not seem to be getting better or if you have a lot of rectal bleeding, your caregiver may perform a procedure to help make the hemorrhoids get smaller or remove them completely. Possible treatments include:   Placing a rubber band at the base of the hemorrhoid to cut off the circulation (rubber band ligation).   Injecting a chemical to shrink the hemorrhoid (sclerotherapy).   Using a tool to burn the hemorrhoid (infrared light therapy).   Surgically removing the hemorrhoid (hemorrhoidectomy).   Stapling  the hemorrhoid to block blood flow to the tissue (hemorrhoid stapling).  HOME CARE INSTRUCTIONS   Eat foods with fiber, such as whole grains, beans, nuts, fruits, and vegetables. Ask your doctor about taking products with added fiber in them (fibersupplements).  Increase fluid intake. Drink enough water and fluids to keep your urine clear or pale yellow.   Exercise regularly.   Go to the bathroom when you have the urge to have a bowel movement. Do not wait.   Avoid straining to have bowel movements.   Keep the anal area dry and clean. Use wet toilet paper or moist towelettes after a bowel movement.   Medicated creams and suppositories may be used or applied as directed.   Only take over-the-counter or prescription medicines as directed by your caregiver.   Take warm sitz baths for 15-20 minutes, 3-4 times a day to ease pain and discomfort.   Place ice packs on the hemorrhoids if they are tender and swollen. Using ice packs between sitz baths may be helpful.   Put ice in a plastic bag.   Place a towel between your skin and the bag.   Leave the ice on for 15-20 minutes, 3-4 times a day.   Do not use a donut-shaped pillow or sit on the toilet for long periods. This increases blood pooling and pain.  SEEK MEDICAL CARE IF:  You have increasing pain and swelling that is not controlled by treatment or medicine.  You have  uncontrolled bleeding.  You have difficulty or you are unable to have a bowel movement.  You have pain or inflammation outside the area of the hemorrhoids. MAKE SURE YOU:  Understand these instructions.  Will watch your condition.  Will get help right away if you are not doing well or get worse.   This information is not intended to replace advice given to you by your health care provider. Make sure you discuss any questions you have with your health care provider.   Document Released: 05/07/2000 Document Revised: 04/26/2012 Document Reviewed:  03/14/2012 Elsevier Interactive Patient Education [employer].

## 2015-04-11 ENCOUNTER — Telehealth: Payer: Self-pay

## 2015-04-11 NOTE — Telephone Encounter (Signed)
It is really hard to be able determine this over the phone.  It is normal for this to be sore but the swollen is a little strange - typically once a thrombosis is excised the swelling goes down.  Sitz baths can be helpful for swelling related to the procedure.  If the pain is the same she needs to be seen again - if the pain is soreness it may be from the procedure.

## 2015-04-11 NOTE — Telephone Encounter (Signed)
Any suggestions? I believe this may be sore for a while.

## 2015-04-11 NOTE — Telephone Encounter (Signed)
Patient states she was seen Wednesday and had a hemorrhoid procedure done. The site is still swollen and painful. I asked if there was any bleeding but she is not sure since she is still on her period. She says if there is any bleeding, its not much. Please call back at [phone].

## 2015-04-11 NOTE — Telephone Encounter (Signed)
Patient called to see if there was a response. I gave patient previous message from [place].

## 2015-06-13 ENCOUNTER — Other Ambulatory Visit: Payer: Self-pay | Admitting: Family Medicine

## 2015-06-17 ENCOUNTER — Telehealth: Payer: Self-pay

## 2015-06-17 NOTE — Telephone Encounter (Signed)
Pharm called inq. About refill req. Original Order:  ALPRAZolam (XANAX) 0.5 MG tablet [id number]         Pharmacy:  [facility], [place]

## 2015-06-17 NOTE — Telephone Encounter (Signed)
11. Insomnia   12. Anxiety - doing well, has used zoloft prev but no need now. Using <30 xanax/yr so refill         Dr. Brigitte Pulse advise on refill.

## 2015-06-18 MED ORDER — ALPRAZOLAM 0.5 MG PO TABS: 0.5000 mg | ORAL_TABLET | ORAL | Status: DC | PRN

## 2015-06-18 NOTE — Telephone Encounter (Signed)
Ok to call in refill x 1 mo, no additional refills.

## 2015-06-18 NOTE — Telephone Encounter (Signed)
Called Rx in to the pharmacy  

## 2015-09-18 ENCOUNTER — Ambulatory Visit: Payer: No Typology Code available for payment source | Admitting: Family Medicine

## 2015-12-07 ENCOUNTER — Other Ambulatory Visit: Payer: Self-pay | Admitting: Family Medicine

## 2015-12-08 ENCOUNTER — Ambulatory Visit (INDEPENDENT_AMBULATORY_CARE_PROVIDER_SITE_OTHER): Payer: No Typology Code available for payment source | Admitting: Family Medicine

## 2015-12-08 VITALS — BP 118/70 | HR 67 | Temp 98.3°F | Resp 18 | Ht 68.0 in | Wt 172.6 lb

## 2015-12-08 DIAGNOSIS — R6882 Decreased libido: Secondary | ICD-10-CM | POA: Diagnosis not present

## 2015-12-08 DIAGNOSIS — G4700 Insomnia, unspecified: Secondary | ICD-10-CM | POA: Diagnosis not present

## 2015-12-08 DIAGNOSIS — F410 Panic disorder [episodic paroxysmal anxiety] without agoraphobia: Secondary | ICD-10-CM

## 2015-12-08 DIAGNOSIS — F419 Anxiety disorder, unspecified: Secondary | ICD-10-CM

## 2015-12-08 DIAGNOSIS — N179 Acute kidney failure, unspecified: Secondary | ICD-10-CM

## 2015-12-08 MED ORDER — SERTRALINE HCL 50 MG PO TABS: 50.0000 mg | ORAL_TABLET | Freq: Every day | ORAL | Status: DC

## 2015-12-08 MED ORDER — ALPRAZOLAM 0.5 MG PO TABS: 0.5000 mg | ORAL_TABLET | Freq: Two times a day (BID) | ORAL | Status: DC | PRN

## 2015-12-08 NOTE — Progress Notes (Signed)
Subjective:  By signing my name below, I, Debbie Shaw, attest that this documentation has been prepared under the direction and in the presence of Debbie Cheadle, MD. Electronically Signed: Moises Shaw, [place]. 12/08/2015 , 5:29 PM .  Patient was seen in Room 3 .   Patient ID: Debbie Shaw, female    DOB: [birth year], 45 y.o.   MRN: [id number] Chief Complaint  Patient presents with  . Medication Problem    Wants to discuss anxiety options  . Has a refill request for xanax    from the pharmacy    HPI Debbie Shaw is a 45 y.o. female who presents to [facility] complaining of anxiety with chest tightness. Patient has not been sleeping well at night, and would wake up in middle of her sleep; she would take xanax if she wakes up (average 2~3 times a week), last taken last night. She's also tried taking OTC Zzzquil for lighter nights.   She also reports having dreams filled with fear and death. She had panic attacks with nausea and sweats in the past when her step daughters were going back to their mother; had taken zoloft and saw a therapist for managing the anxiety. She also mentions having chest tightness and tingling in her fingers. She notes history of injury to her chest. She's started back into exercise this summer, along with diet changes. 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   She also complains decreased libido and think it's due to stress. She denies knowledge if zoloft decreased her libido in the past.   Past Medical History  Diagnosis Date  . Allergy   . Asthma     as child  . Anxiety   . Panic attacks   . Depression    Prior to Admission medications   Medication Sig Start Date End Date Taking? Authorizing Provider  ALPRAZolam Duanne Moron) 0.5 MG tablet Take 1 tablet (0.5 mg total) by mouth as needed. 06/18/15  Yes Shawnee Knapp, MD  ibuprofen (ADVIL,MOTRIN) 200 MG tablet Take 200 mg by mouth as needed.   Yes Historical Provider, MD  valACYclovir  (VALTREX) 1000 MG tablet Take 2 tablets (2,000 mg total) by mouth 2 (two) times daily. For 1 day 04/09/15  Yes Dorian Heckle English, PA   Allergies  Allergen Reactions  . Morphine And Related Nausea And Vomiting  . 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 Sulfa Antibiotics Nausea And Vomiting    Review of Systems  Constitutional: Positive for fatigue. Negative for fever, chills and appetite change.  Respiratory: Positive for chest tightness.   Cardiovascular: Negative for chest pain.  Gastrointestinal: Negative for nausea, vomiting and diarrhea.  Neurological: Negative for weakness and headaches.  Psychiatric/Behavioral: Positive for sleep disturbance. Negative for dysphoric mood. The patient is nervous/anxious.        Objective:   Physical Exam  Constitutional: She is oriented to person, place, and time. She appears well-developed and well-nourished. No distress.  HENT:  Head: Normocephalic and atraumatic.  Eyes: EOM are normal. Pupils are equal, round, and reactive to light.  Neck: Neck supple.  Cardiovascular: Normal rate, regular rhythm, S1 normal, S2 normal and normal heart sounds.   No murmur heard. Pulmonary/Chest: Effort normal. No respiratory distress.  Musculoskeletal: Normal range of motion.  Neurological: She is alert and oriented to person, place, and time.  Skin: Skin is warm and dry.  Psychiatric: She  has a normal mood and affect. Her behavior is normal.  Nursing note and vitals reviewed.   BP 118/70 mmHg  Pulse 67  Temp(Src) 98.3 F (36.8 C) (Oral)  Resp 18  Ht 5\' 8"  (1.727 m)  Wt 172 lb 9.6 oz (78.291 kg)  BMI 26.25 kg/m2  SpO2 100%  LMP 11/17/2015    Assessment & Plan:   1. Insomnia - If not sleeping, can call in trazodone. For now cont to alternate melatonin, xanax, and diphenhydramine  2. Anxiety - worsening due to current life stressors so restart zoloft which she did well on sev yrs  prior - plan to come off in 6 mos or so when stressors improved.  3. Panic attacks   4. Low libido - check labs - suspect due to mood sxs but concern that zoloft will worsen - could try wellbutrin but might exac anxiety and insomnia.  Could cons trial of topical or even viagra if sxs do not improve in 3-6 mos    Orders Placed This Encounter  Procedures  . CBC  . Comprehensive metabolic panel  . Thyroid Panel With TSH  . Vitamin B12    Meds ordered this encounter  Medications  . sertraline (ZOLOFT) 50 MG tablet    Sig: Take 1 tablet (50 mg total) by mouth daily. Start half a tab a day for 1-2 wks, then increase 1 tab/d.    Dispense:  90 tablet    Refill:  1  . ALPRAZolam (XANAX) 0.5 MG tablet    Sig: Take 1 tablet (0.5 mg total) by mouth 2 (two) times daily as needed for anxiety or sleep.    Dispense:  30 tablet    Refill:  2    I personally performed the services described in this documentation, which was scribed in my presence. The recorded information has been reviewed and considered, and addended by me as needed.   Debbie Shaw, M.D.  [facility] [address] [place], [place] [address] [phone] phone [phone] fax  12/09/2015 1:10 AM

## 2015-12-08 NOTE — Patient Instructions (Addendum)
IF you received an x-ray today, you will receive an invoice from [facility]. Please contact [facility] at [phone] with questions or concerns regarding your invoice.   IF you received labwork today, you will receive an invoice from [employer]. Please contact [facility] at [phone] with questions or concerns regarding your invoice.   Our billing staff will not be able to assist you with questions regarding bills from these companies.  You will be contacted with the lab results as soon as they are available. The fastest way to get your results is to activate your My Chart account. Instructions are located on the last page of this paperwork. If you have not heard from [place] regarding the results in 2 weeks, please contact this office.   Panic Attacks Panic attacks are sudden, short-livedsurges of severe anxiety, fear, or discomfort. They may occur for no reason when you are relaxed, when you are anxious, or when you are sleeping. Panic attacks may occur for a number of reasons:   Healthy people occasionally have panic attacks in extreme, life-threatening situations, such as war or natural disasters. Normal anxiety is a protective mechanism of the body that helps [place] react to danger (fight or flight response).  Panic attacks are often seen with anxiety disorders, such as panic disorder, social anxiety disorder, generalized anxiety disorder, and phobias. Anxiety disorders cause excessive or uncontrollable anxiety. They may interfere with your relationships or other life activities.  Panic attacks are sometimes seen with other mental illnesses, such as depression and posttraumatic stress disorder.  Certain medical conditions, prescription medicines, and drugs of abuse can cause panic attacks. SYMPTOMS  Panic attacks start suddenly, peak within 20 minutes, and are accompanied by four or more of the following symptoms:  Pounding heart or fast  heart rate (palpitations).  Sweating.  Trembling or shaking.  Shortness of breath or feeling smothered.  Feeling choked.  Chest pain or discomfort.  Nausea or strange feeling in your stomach.  Dizziness, light-headedness, or feeling like you will faint.  Chills or hot flushes.  Numbness or tingling in your lips or hands and feet.  Feeling that things are not real or feeling that you are not yourself.  Fear of losing control or going crazy.  Fear of dying. Some of these symptoms can mimic serious medical conditions. For example, you may think you are having a heart attack. Although panic attacks can be very scary, they are not life threatening. DIAGNOSIS  Panic attacks are diagnosed through an assessment by your health care provider. Your health care provider will ask questions about your symptoms, such as where and when they occurred. Your health care provider will also ask about your medical history and use of alcohol and drugs, including prescription medicines. Your health care provider may order blood tests or other studies to rule out a serious medical condition. Your health care provider may refer you to a mental health professional for further evaluation. TREATMENT   Most healthy people who have one or two panic attacks in an extreme, life-threatening situation will not require treatment.  The treatment for panic attacks associated with anxiety disorders or other mental illness typically involves counseling with a mental health professional, medicine, or a combination of both. Your health care provider will help determine what treatment is best for you.  Panic attacks due to physical illness usually go away with treatment of the illness. If prescription medicine is causing panic attacks, talk with your health care provider  about stopping the medicine, decreasing the dose, or substituting another medicine.  Panic attacks due to alcohol or drug abuse go away with abstinence.  Some adults need professional help in order to stop drinking or using drugs. HOME CARE INSTRUCTIONS   Take all medicines as directed by your health care provider.   Schedule and attend follow-up visits as directed by your health care provider. It is important to keep all your appointments. SEEK MEDICAL CARE IF:  You are not able to take your medicines as prescribed.  Your symptoms do not improve or get worse. SEEK IMMEDIATE MEDICAL CARE IF:   You experience panic attack symptoms that are different than your usual symptoms.  You have serious thoughts about hurting yourself or others.  You are taking medicine for panic attacks and have a serious side effect. MAKE SURE YOU:  Understand these instructions.  Will watch your condition.  Will get help right away if you are not doing well or get worse.   This information is not intended to replace advice given to you by your health care provider. Make sure you discuss any questions you have with your health care provider.   Document Released: 05/10/2005 Document Revised: 05/15/2013 Document Reviewed: 12/22/2012 Elsevier Interactive Patient Education 2016 Elsevier Inc.   Generalized Anxiety Disorder Generalized anxiety disorder (GAD) is a mental disorder. It interferes with life functions, including relationships, work, and school. GAD is different from normal anxiety, which everyone experiences at some point in their lives in response to specific life events and activities. Normal anxiety actually helps [place] prepare for and get through these life events and activities. Normal anxiety goes away after the event or activity is over.  GAD causes anxiety that is not necessarily related to specific events or activities. It also causes excess anxiety in proportion to specific events or activities. The anxiety associated with GAD is also difficult to control. GAD can vary from mild to severe. People with severe GAD can have intense waves of  anxiety with physical symptoms (panic attacks).  SYMPTOMS The anxiety and worry associated with GAD are difficult to control. This anxiety and worry are related to many life events and activities and also occur more days than not for 6 months or longer. People with GAD also have three or more of the following symptoms (one or more in children):  Restlessness.   Fatigue.  Difficulty concentrating.   Irritability.  Muscle tension.  Difficulty sleeping or unsatisfying sleep. DIAGNOSIS GAD is diagnosed through an assessment by your health care provider. Your health care provider will ask you questions aboutyour mood,physical symptoms, and events in your life. Your health care provider may ask you about your medical history and use of alcohol or drugs, including prescription medicines. Your health care provider may also do a physical exam and blood tests. Certain medical conditions and the use of certain substances can cause symptoms similar to those associated with GAD. Your health care provider may refer you to a mental health specialist for further evaluation. TREATMENT The following therapies are usually used to treat GAD:   Medication. Antidepressant medication usually is prescribed for long-term daily control. Antianxiety medicines may be added in severe cases, especially when panic attacks occur.   Talk therapy (psychotherapy). Certain types of talk therapy can be helpful in treating GAD by providing support, education, and guidance. A form of talk therapy called cognitive behavioral therapy can teach you healthy ways to think about and react to daily life events and activities.  Stress managementtechniques. These include yoga, meditation, and exercise and can be very helpful when they are practiced regularly. A mental health specialist can help determine which treatment is best for you. Some people see improvement with one therapy. However, other people require a combination of  therapies.   This information is not intended to replace advice given to you by your health care provider. Make sure you discuss any questions you have with your health care provider.   Document Released: 09/04/2012 Document Revised: 05/31/2014 Document Reviewed: 09/04/2012 Elsevier Interactive Patient Education [employer].

## 2015-12-09 LAB — COMPREHENSIVE METABOLIC PANEL
ALBUMIN: 4.4 g/dL (ref 3.6–5.1)
ALK PHOS: 34 U/L (ref 33–115)
ALT: 13 U/L (ref 6–29)
AST: 15 U/L (ref 10–30)
BILIRUBIN TOTAL: 0.7 mg/dL (ref 0.2–1.2)
BUN: 16 mg/dL (ref 7–25)
CHLORIDE: 105 mmol/L (ref 98–110)
CO2: 24 mmol/L (ref 20–31)
CREATININE: 1.16 mg/dL — AB (ref 0.50–1.10)
Calcium: 9.3 mg/dL (ref 8.6–10.2)
Glucose, Bld: 81 mg/dL (ref 65–99)
Potassium: 3.8 mmol/L (ref 3.5–5.3)
SODIUM: 139 mmol/L (ref 135–146)
TOTAL PROTEIN: 7.1 g/dL (ref 6.1–8.1)

## 2015-12-09 LAB — CBC
HCT: 40.6 % (ref 35.0–45.0)
HEMOGLOBIN: 14.1 g/dL (ref 11.7–15.5)
MCH: 31 pg (ref 27.0–33.0)
MCHC: 34.7 g/dL (ref 32.0–36.0)
MCV: 89.2 fL (ref 80.0–100.0)
MPV: 9.9 fL (ref 7.5–12.5)
PLATELETS: 274 10*3/uL (ref 140–400)
RBC: 4.55 MIL/uL (ref 3.80–5.10)
RDW: 12.9 % (ref 11.0–15.0)
WBC: 10.1 10*3/uL (ref 3.8–10.8)

## 2015-12-09 LAB — THYROID PANEL WITH TSH
Free Thyroxine Index: 2.4 (ref 1.4–3.8)
T3 Uptake: 31 % (ref 22–35)
T4 TOTAL: 7.9 ug/dL (ref 4.5–12.0)
TSH: 1.78 mIU/L

## 2015-12-09 LAB — VITAMIN B12: Vitamin B-12: 645 pg/mL (ref 200–1100)

## 2015-12-15 NOTE — Addendum Note (Signed)
Addended by: Delman Cheadle on: 12/15/2015 10:55 AM   Modules accepted: Orders

## 2015-12-23 ENCOUNTER — Telehealth: Payer: Self-pay

## 2015-12-23 ENCOUNTER — Ambulatory Visit (INDEPENDENT_AMBULATORY_CARE_PROVIDER_SITE_OTHER): Payer: No Typology Code available for payment source | Admitting: Family Medicine

## 2015-12-23 DIAGNOSIS — N179 Acute kidney failure, unspecified: Secondary | ICD-10-CM | POA: Diagnosis not present

## 2015-12-23 LAB — BASIC METABOLIC PANEL
BUN: 16 mg/dL (ref 7–25)
CO2: 25 mmol/L (ref 20–31)
Calcium: 8.7 mg/dL (ref 8.6–10.2)
Chloride: 106 mmol/L (ref 98–110)
Creat: 0.92 mg/dL (ref 0.50–1.10)
GLUCOSE: 95 mg/dL (ref 65–99)
POTASSIUM: 3.8 mmol/L (ref 3.5–5.3)
Sodium: 138 mmol/L (ref 135–146)

## 2015-12-23 NOTE — Progress Notes (Signed)
Pt. Here for a lab only visit

## 2015-12-25 NOTE — Telephone Encounter (Signed)
error 

## 2016-01-04 ENCOUNTER — Encounter: Payer: Self-pay | Admitting: Family Medicine

## 2016-06-01 ENCOUNTER — Other Ambulatory Visit: Payer: Self-pay | Admitting: Family Medicine

## 2016-06-01 NOTE — Telephone Encounter (Signed)
Spoke with patient informed this is last refill needs to make appointment before run out.  Patient voiced understanding

## 2016-07-21 ENCOUNTER — Encounter: Payer: Self-pay | Admitting: Physician Assistant

## 2016-07-21 ENCOUNTER — Ambulatory Visit (INDEPENDENT_AMBULATORY_CARE_PROVIDER_SITE_OTHER): Payer: BC Managed Care – PPO | Admitting: Physician Assistant

## 2016-07-21 VITALS — BP 117/78 | HR 65 | Temp 98.0°F | Resp 18 | Ht 66.0 in | Wt 169.8 lb

## 2016-07-21 DIAGNOSIS — F411 Generalized anxiety disorder: Secondary | ICD-10-CM | POA: Diagnosis not present

## 2016-07-21 MED ORDER — ALPRAZOLAM 0.5 MG PO TABS: ORAL_TABLET | ORAL | 1 refills | Status: DC

## 2016-07-21 MED ORDER — TRAZODONE HCL 50 MG PO TABS: 25.0000 mg | ORAL_TABLET | Freq: Every evening | ORAL | 1 refills | Status: DC | PRN

## 2016-07-21 MED ORDER — SERTRALINE HCL 25 MG PO TABS: 25.0000 mg | ORAL_TABLET | Freq: Every day | ORAL | 1 refills | Status: DC

## 2016-07-21 NOTE — Patient Instructions (Addendum)
Please come back in two months.     We recommend that you schedule a mammogram for breast cancer screening. Typically, you do not need a referral to do this. Please contact a local imaging center to schedule your mammogram.  [facility] - [phone]  *ask for the Radiology Department [facility] - [phone] or [phone]  [facility] - [phone] [place] [phone] [facility] - [phone]  *ask for the [facility] - [phone]  *ask for the Radiology Department [facility] - [phone]  *ask for the [facility] - [phone]

## 2016-07-21 NOTE — Progress Notes (Signed)
07/21/2016 5:54 PM   DOB: [birth year] / MRN: [id number]  SUBJECTIVE:  Debbie Shaw is a 46 y.o. female presenting for Xanax refills.  Tells me that she needs this mostly for sleep.  She does wake up at night between 1:30 or 3 am.  She has three children, two of which are still at home.  She did just restart taking sertraline for anxiety and tightness in her chest and does not note two much improvement in her symptoms.  She tells me she is needing more and more Xanax. She is amenable to changing her medications today.  Depression screen [facility] 2/9 07/21/2016  Decreased Interest 0  Down, Depressed, Hopeless 1  PHQ - 2 Score 1  Altered sleeping 1  Tired, decreased energy 0  Change in appetite 0  Feeling bad or failure about yourself  0  Trouble concentrating 0  Moving slowly or fidgety/restless 0  Suicidal thoughts 0  PHQ-9 Score 2     She is allergic to morphine and related; other; and sulfa antibiotics.   She  has a past medical history of Allergy; Anxiety; Asthma; Depression; and Panic attacks.    She  reports that she has never smoked. She has never used smokeless tobacco. She reports that she drinks alcohol. She reports that she does not use drugs. She  reports that she currently engages in sexual activity. She reports using the following method of birth control/protection: Surgical. The patient  has a past surgical history that includes Cholecystectomy; Knee arthroscopy; Knee arthroscopy w/ ACL reconstruction; and Tonsillectomy.  Her family history includes Colon cancer in her maternal grandfather; Heart disease in her maternal grandmother; Hyperlipidemia in her mother; Irritable bowel syndrome in her cousin and maternal uncle; Thyroid disease in her mother.  Review of Systems  Constitutional: Negative for fever.  Gastrointestinal: Negative for nausea.  Neurological: Negative for dizziness.  Psychiatric/Behavioral: Negative for depression, hallucinations, memory loss, substance abuse  and suicidal ideas. The patient is nervous/anxious and has insomnia.     The problem list and medications were reviewed and updated by myself where necessary and exist elsewhere in the encounter.   OBJECTIVE:  BP 117/78 (BP Location: Right Arm, Patient Position: Sitting, Cuff Size: Normal)   Pulse 65   Temp 98 F (36.7 C) (Oral)   Resp 18   Ht 5\' 6"  (1.676 m)   Wt 169 lb 12.8 oz (77 kg)   LMP 07/07/2016 (Approximate)   SpO2 99%   BMI 27.41 kg/m   Physical Exam  Constitutional: She is oriented to person, place, and time. She appears well-developed and well-nourished. No distress.  Cardiovascular: Normal rate and regular rhythm.   Pulmonary/Chest: Effort normal and breath sounds normal.  Abdominal: Bowel sounds are normal.  Musculoskeletal: Normal range of motion.  Neurological: She is alert and oriented to person, place, and time.  Skin: Skin is warm and dry. She is not diaphoretic.  Psychiatric: She has a normal mood and affect. Her behavior is normal. Judgment and thought content normal.    No results found for this or any previous visit (from the past 72 hour(s)).  No results found.  ASSESSMENT AND PLAN:  [name] was seen today for medication refill.  Diagnoses and all orders for this visit:  GAD (generalized anxiety disorder): Increasing her sertraline to 75 mg daily.  Starting trazodone for sleep and have advised her against taking Xanax to sleep.  Xanax written and advised this is for panic attacks only.  Recheck in two months and  will perform a physical at that time as well.  Advised she make an appointment.   -     sertraline (ZOLOFT) 25 MG tablet; Take 1 tablet (25 mg total) by mouth daily. -     traZODone (DESYREL) 50 MG tablet; Take 0.5-1 tablets (25-50 mg total) by mouth at bedtime as needed for sleep. -     ALPRAZolam (XANAX) 0.5 MG tablet; For panic symptoms only.    The patient is advised to call or return to clinic if she does not see an improvement in  symptoms, or to seek the care of the closest emergency department if she worsens with the above plan.   Philis Fendt, MHS, PA-C [facility] 07/21/2016 5:54 PM

## 2016-08-07 ENCOUNTER — Ambulatory Visit (INDEPENDENT_AMBULATORY_CARE_PROVIDER_SITE_OTHER): Payer: BC Managed Care – PPO | Admitting: Family Medicine

## 2016-08-07 VITALS — BP 98/78 | HR 72 | Temp 98.1°F | Resp 16 | Ht 69.25 in | Wt 169.0 lb

## 2016-08-07 DIAGNOSIS — K645 Perianal venous thrombosis: Secondary | ICD-10-CM

## 2016-08-07 NOTE — Progress Notes (Signed)
Subjective:     Patient ID: Debbie Shaw, female    DOB: 1970/06/11, 46 y.o.   MRN: [id number]  Chief Complaint  Patient presents with  . Hemorrhoids    X 1 week   HPI Debbie Shaw is a 46 y.o. female who presents to Primary Care at [facility] complaining of recurrence of a prior hemorrhoid 2d ago and then severe pain yesterday. No blood, is very sensitive to touch.  Has been using wipes, pads, and cream otc with minimal relief.  Bowels ok. No constipation.    Past Medical History:  Diagnosis Date  . Allergy   . Anxiety   . Asthma    as child  . Depression   . Panic attacks    Current Outpatient Prescriptions on File Prior to Visit  Medication Sig Dispense Refill  . ALPRAZolam (XANAX) 0.5 MG tablet For panic symptoms only. 20 tablet 1  . ibuprofen (ADVIL,MOTRIN) 200 MG tablet Take 200 mg by mouth as needed.    . sertraline (ZOLOFT) 25 MG tablet Take 1 tablet (25 mg total) by mouth daily. 90 tablet 1  . sertraline (ZOLOFT) 50 MG tablet START HALF A TABLET A DAY FOR 1-2 WEEKS, THEN INCREASE TO 1 TABLET DAILY 90 tablet 0  . traZODone (DESYREL) 50 MG tablet Take 0.5-1 tablets (25-50 mg total) by mouth at bedtime as needed for sleep. 90 tablet 1  . valACYclovir (VALTREX) 1000 MG tablet Take 2 tablets (2,000 mg total) by mouth 2 (two) times daily. For 1 day 4 tablet 5   No current facility-administered medications on file prior to visit.    Allergies  Allergen Reactions  . Morphine And Related Nausea And Vomiting  . 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 Sulfa Antibiotics Nausea And Vomiting   Review of Systems  Constitutional: Positive for activity change. Negative for chills, fever and unexpected weight change.  Gastrointestinal: Positive for rectal pain. Negative for abdominal distention, abdominal pain, anal bleeding, blood in stool, constipation, diarrhea, nausea and vomiting.  Genitourinary: Negative for  difficulty urinating, dysuria, pelvic pain, vaginal bleeding and vaginal discharge.  Musculoskeletal: Positive for gait problem. Negative for arthralgias, back pain, joint swelling and myalgias.  Skin: Negative for color change and rash.  Neurological: Negative for dizziness, weakness and light-headedness.  Hematological: Negative for adenopathy. Does not bruise/bleed easily.  Psychiatric/Behavioral: Positive for sleep disturbance.      Objective:   Physical Exam  Constitutional: She is oriented to person, place, and time. She appears well-developed and well-nourished. No distress.  HENT:  Head: Normocephalic and atraumatic.  Eyes: Conjunctivae and EOM are normal.  Neck: Neck supple. No tracheal deviation present.  Cardiovascular: Normal rate.   Pulmonary/Chest: Effort normal. No respiratory distress.  Genitourinary: Rectal exam shows external hemorrhoid, internal hemorrhoid, mass and tenderness. Rectal exam shows anal tone normal.  Musculoskeletal: Normal range of motion.  Neurological: She is alert and oriented to person, place, and time.  Skin: Skin is warm and dry.  Psychiatric: She has a normal mood and affect. Her behavior is normal.  Nursing note and vitals reviewed.  BP 98/78   Pulse 72   Temp 98.1 F (36.7 C) (Oral)   Resp 16   Ht 5' 9.25" (1.759 m)   Wt 169 lb (76.7 kg)   LMP 07/09/2016 (Approximate)   SpO2 99%   BMI 24.78 kg/m      Verbal consent obtained after discussion  of risks/benefits and alt options. Thrombosed external hemorrhoid cleaned with betadine x 2.  Anesthesia with 2% lidocaine in subcu fashion to apex and deep into hemorrhoid.  Overlying skin removed with iris scissors and blunt dissection of below tissue to remove 1 moderate hemorrhoidal blood clot. Tissue explored with curved blunt hemostats and forcep to ensure no additional sig clots remained. Area cleaned again with water and bactroban applied topically with pad. Pt tolerated procedure well. EBL  sig - approx 20cc. (we did not have the desired anesthetic with epi avail to me at this particular time. 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    Delman Cheadle, M.D.  Primary Care at [facility] [address] [place], [place] [address] [phone] phone [phone] fax  08/31/16 5:50 PM

## 2016-08-07 NOTE — Patient Instructions (Addendum)
IF you received an x-ray today, you will receive an invoice from [facility]. Please contact [facility] at [phone] with questions or concerns regarding your invoice.   IF you received labwork today, you will receive an invoice from [facility]. Please contact [facility] at [phone] with questions or concerns regarding your invoice.   Our billing staff will not be able to assist you with questions regarding bills from these companies.  You will be contacted with the lab results as soon as they are available. The fastest way to get your results is to activate your My Chart account. Instructions are located on the last page of this paperwork. If you have not heard from [place] regarding the results in 2 weeks, please contact this office.     How to Take a Sitz Bath A sitz bath is a warm water bath that is taken while you are sitting down. The water should only come up to your hips and should cover your buttocks. Your health care provider may recommend a sitz bath to help you:  Clean the lower part of your body, including your genital area.  With itching.  With pain.  With sore muscles or muscles that tighten or spasm. How to take a sitz bath Take 3-4 sitz baths per day or as told by your health care provider. 1. Partially fill a bathtub with warm water. You will only need the water to be deep enough to cover your hips and buttocks when you are sitting in it. 2. If your health care provider told you to put medicine in the water, follow the directions exactly. 3. Sit in the water and open the tub drain a little. 4. Turn on the warm water again to keep the tub at the correct level. Keep the water running constantly. 5. Soak in the water for 15-20 minutes or as told by your health care provider. 6. After the sitz bath, pat the affected area dry first. Do not rub it. 7. Be careful when you stand up after the sitz bath because you may feel dizzy. Contact a health care  provider if:  Your symptoms get worse. Do not continue with sitz baths if your symptoms get worse.  You have new symptoms. Do not continue with sitz baths until you talk with your health care provider. This information is not intended to replace advice given to you by your health care provider. Make sure you discuss any questions you have with your health care provider. Document Released: 01/31/2004 Document Revised: 10/08/2015 Document Reviewed: 05/08/2014 Elsevier Interactive Patient Education  2017 [place]. Surgical Procedures for Hemorrhoids, Care After Refer to this sheet in the next few weeks. These instructions provide you with information about caring for yourself after your procedure. Your health care provider may also give you more specific instructions. Your treatment has been planned according to current medical practices, but problems sometimes occur. Call your health care provider if you have any problems or questions after your procedure. What can I expect after the procedure? After the procedure, it is common to have:  Rectal pain.  Pain when you are having a bowel movement.  Slight rectal bleeding. Follow these instructions at home: Medicines   Take over-the-counter and prescription medicines only as told by your health care provider.  Do not drive or operate heavy machinery while taking prescription pain medicine.  Use a stool softener or a bulk laxative as told by your health care provider. Activity   Rest at  home. Return to your normal activities as told by your health care provider.  Do not lift anything that is heavier than 10 lb (4.5 kg).  Do not sit for long periods of time. Take a walk every day or as told by your health care provider.  Do not strain to have a bowel movement. Do not spend a long time sitting on the toilet. Eating and drinking   Eat foods that contain fiber, such as whole grains, beans, nuts, fruits, and vegetables.  Drink enough  fluid to keep your urine clear or pale yellow. General instructions   Sit in a warm bath 2-3 times per day to relieve soreness or itching.  Keep all follow-up visits as told by your health care provider. This is important. Contact a health care provider if:  Your pain medicine is not helping.  You have a fever or chills.  You become constipated.  You have trouble passing urine. Get help right away if:  You have very bad rectal pain.  You have heavy bleeding from your rectum. This information is not intended to replace advice given to you by your health care provider. Make sure you discuss any questions you have with your health care provider. Document Released: 07/31/2003 Document Revised: 10/16/2015 Document Reviewed: 08/05/2014 Elsevier Interactive Patient Education  2017 [place].  Surgical Procedures for Hemorrhoids Surgical procedures can be used to treat hemorrhoids. Hemorrhoids are swollen veins that are inside the rectum (internal hemorrhoids) or around the anus (external hemorrhoids). They are caused by increased pressure in the anal area. This pressure may result from straining to have a bowel movement (constipation), diarrhea, pregnancy, obesity, anal sex, or sitting for long periods of time. Hemorrhoids can cause symptoms such as pain and bleeding. Surgery may be needed if diet changes, lifestyle changes, and other treatments do not help your symptoms. Various surgical methods may be used. Three common methods are:  Closed hemorrhoidectomy. The hemorrhoids are surgically removed, and the surgical cuts (incisions) are closed with stitches (sutures).  Open hemorrhoidectomy. The hemorrhoids are surgically removed, but the incisions are allowed to heal without sutures.  Stapled hemorrhoidopexy. The hemorrhoids are removed using a device that takes out a ring of excess tissue. Tell a health care provider about:  Any allergies you have.  All medicines you are taking,  including vitamins, herbs, eye drops, creams, and over-the-counter medicines.  Any problems you or family members have had with anesthetic medicines.  Any blood disorders you have.  Any surgeries you have had.  Any medical conditions you have.  Whether you are pregnant or may be pregnant. What are the risks? Generally, this is a safe procedure. However, problems may occur, including:  Infection.  Bleeding.  Allergic reactions to medicines.  Damage to other structures or organs.  Pain.  Constipation.  Difficulty passing urine.  Narrowing of the anal canal (stenosis).  Difficulty controlling bowel movements (incontinence). What happens before the procedure?  Ask your health care provider about:  Changing or stopping your regular medicines. This is especially important if you are taking diabetes medicines or blood thinners.  Taking medicines such as aspirin and ibuprofen. These medicines can thin your blood. Do not take these medicines before your procedure if your health care provider instructs you not to.  You may need to have a procedure to examine the inside of your colon with a scope (colonoscopy). Your health care provider may do this to make sure that there are no other causes for your bleeding  or pain.  Follow instructions from your health care provider about eating or drinking restrictions.  You may be instructed to take a laxative and an enema to clean out your colon before surgery (bowel prep). Carefully follow instructions from your health care provider about bowel prep.  Ask your health care provider how your surgical site will be marked or identified.  You may be given antibiotic medicine to help prevent infection.  Plan to have someone take you home after the procedure. What happens during the procedure?  To reduce your risk of infection:  Your health care team will wash or sanitize their hands.  Your skin will be washed with soap.  An IV tube  will be inserted into one of your veins.  You will be given one or more of the following:  A medicine to help you relax (sedative).  A medicine to numb the area (local anesthetic).  A medicine to make you fall asleep (general anesthetic).  A medicine that is injected into an area of your body to numb everything below the injection site (regional anesthetic).  A lubricating jelly may be placed into your rectum.  Your surgeon will insert a short scope (anoscope) into your rectum to examine the hemorrhoids.  One of the following hemorrhoid procedures will be performed. Closed Hemorrhoidectomy   Your surgeon will use surgical instruments to open the tissue around the hemorrhoids.  The veins that supply the hemorrhoids will be tied off with a suture.  The hemorrhoids will be removed.  The tissue that surrounds the hemorrhoids will be closed with sutures that your body can absorb (absorbable sutures). Open Hemorrhoidectomy   The hemorrhoids will be removed with surgical instruments.  The incisions will be left open to heal without sutures. Stapled Hemorrhoidopexy   Your surgeon will use a circular stapling device to remove the hemorrhoids.  The device will be inserted into your anus. It will remove a circular ring of tissue that includes hemorrhoid tissue and some tissue above the hemorrhoids.  The staples in the device will close the edges of removed tissue. This will cut off the blood supply to the hemorrhoids and will pull any remaining hemorrhoids back into place. Each of these procedures may vary among health care providers and hospitals. What happens after the procedure?  Your blood pressure, heart rate, breathing rate, and blood oxygen level will be monitored often until the medicines you were given have worn off.  You will be given pain medicine as needed. This information is not intended to replace advice given to you by your health care provider. Make sure you discuss  any questions you have with your health care provider. Document Released: 03/07/2009 Document Revised: 10/16/2015 Document Reviewed: 08/05/2014 Elsevier Interactive Patient Education  2017 [employer]  Nonsurgical Procedures for Hemorrhoids Nonsurgical procedures can be used to treat hemorrhoids. Hemorrhoids are swollen veins that are inside the rectum (internal hemorrhoids) or around the anus (external hemorrhoids). They are caused by increased pressure in the anal area. This pressure may result from straining to have a bowel movement (constipation), diarrhea, pregnancy, obesity, anal sex, or sitting for long periods of time. Hemorrhoids can cause symptoms such as pain and bleeding. Various procedures may be performed if diet changes, lifestyle changes, and other treatments do not help your symptoms. Some of these procedures do not involve surgery. Three common nonsurgical procedures are:  Rubber band ligation. Rubber bands are used to cut off the blood supply to the hemorrhoids.  Sclerotherapy. Medicine is  injected into the hemorrhoids to shrink them.  Infrared coagulation. A type of light energy is used to get rid of the hemorrhoids. Tell a health care provider about:  Any allergies you have.  All medicines you are taking, including vitamins, herbs, eye drops, creams, and over-the-counter medicines.  Any problems you or family members have had with anesthetic medicines.  Any blood disorders you have.  Any surgeries you have had.  Any medical conditions you have.  Whether you are pregnant or may be pregnant. What are the risks? Generally, this is a safe procedure. However, problems may occur, including:  Infection.  Bleeding.  Pain. What happens before the procedure?  Ask your health care provider about:  Changing or stopping your regular medicines. This is especially important if you are taking diabetes medicines or blood thinners.  Taking medicines such as aspirin and  ibuprofen. These medicines can thin your blood. Do not take these medicines before your procedure if your health care provider instructs you not to.  You may need to have a procedure to examine the inside of your colon with a scope (colonoscopy). Your health care provider may do this to make sure that there are no other causes for your bleeding or pain. What happens during the procedure?  Your health care provider will clean your rectal area with a rinsing solution.  A lubricating jelly may be placed into your rectum. The jelly may contain a medicine to numb the area (local anesthetic).  Your health care provider will insert a short scope (anoscope) into your rectum to examine the hemorrhoids.  One of the following techniques will be used. Rubber Band Ligation  Your health care provider will place medical instruments through the scope to put rubber bands around the base of your hemorrhoids. The bands will cut off the blood supply to the hemorrhoids. The hemorrhoids will fall off after several days. Sclerotherapy  Your health care provider will inject medicine through the scope into your hemorrhoids. This will cause them to shrink and dry up. Infrared Coagulation  Your health care provider will shine a type of light through the scope onto your hemorrhoids. This light will generate energy (infrared radiation). It will cause the hemorrhoids to scar and then fall off. Each of these procedures may vary among health care providers and hospitals. What happens after the procedure?  You will be monitored to make sure that you have no bleeding.  Return to your normal activities as told by your health care provider. This information is not intended to replace advice given to you by your health care provider. Make sure you discuss any questions you have with your health care provider. Document Released: 03/07/2009 Document Revised: 10/16/2015 Document Reviewed: 08/05/2014 Elsevier Interactive Patient  Education  2017 [employer].

## 2016-08-31 ENCOUNTER — Telehealth: Payer: Self-pay | Admitting: Family Medicine

## 2016-08-31 NOTE — Telephone Encounter (Signed)
Did you want to refer? I dont see referral

## 2016-08-31 NOTE — Telephone Encounter (Signed)
Referral placed  Thanks!

## 2016-08-31 NOTE — Telephone Encounter (Signed)
Pt calling in regards to a referral for hemorrhoids. I did not see this referral placed. Please advise. Pt callback number is [phone].

## 2016-09-25 ENCOUNTER — Other Ambulatory Visit: Payer: Self-pay | Admitting: Family Medicine

## 2016-09-25 NOTE — Telephone Encounter (Signed)
06/2016 last ov 

## 2016-10-12 ENCOUNTER — Encounter (INDEPENDENT_AMBULATORY_CARE_PROVIDER_SITE_OTHER): Payer: Self-pay

## 2016-10-12 ENCOUNTER — Encounter: Payer: Self-pay | Admitting: Gastroenterology

## 2016-10-12 ENCOUNTER — Ambulatory Visit (INDEPENDENT_AMBULATORY_CARE_PROVIDER_SITE_OTHER): Payer: BC Managed Care – PPO | Admitting: Gastroenterology

## 2016-10-12 VITALS — BP 104/70 | HR 72 | Ht 68.11 in | Wt 177.2 lb

## 2016-10-12 DIAGNOSIS — K645 Perianal venous thrombosis: Secondary | ICD-10-CM | POA: Diagnosis not present

## 2016-10-12 DIAGNOSIS — Z1211 Encounter for screening for malignant neoplasm of colon: Secondary | ICD-10-CM | POA: Diagnosis not present

## 2016-10-12 DIAGNOSIS — Z1212 Encounter for screening for malignant neoplasm of rectum: Secondary | ICD-10-CM

## 2016-10-12 NOTE — Progress Notes (Signed)
History of Present Illness: This is a 46 year old female referred by Shawnee Knapp, MD for the evaluation of hemorrhoids. She has had 2 thrombosed hemorrhoids and in between these 2 episodes she related no problems. BMs every other day. Denies weight loss, abdominal pain, constipation, diarrhea, change in stool caliber, melena, hematochezia, nausea, vomiting, dysphagia, reflux symptoms, chest pain.   Allergies  Allergen Reactions  . Morphine And Related Nausea And Vomiting  . 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 Sulfa Antibiotics Nausea And Vomiting   Outpatient Medications Prior to Visit  Medication Sig Dispense Refill  . ALPRAZolam (XANAX) 0.5 MG tablet For panic symptoms only. 20 tablet 1  . ibuprofen (ADVIL,MOTRIN) 200 MG tablet Take 200 mg by mouth as needed.    . sertraline (ZOLOFT) 25 MG tablet Take 1 tablet (25 mg total) by mouth daily. 90 tablet 1  . sertraline (ZOLOFT) 50 MG tablet Take 1.5 tablets (75 mg total) by mouth daily. 135 tablet 3  . traZODone (DESYREL) 50 MG tablet Take 0.5-1 tablets (25-50 mg total) by mouth at bedtime as needed for sleep. 90 tablet 1  . valACYclovir (VALTREX) 1000 MG tablet Take 2 tablets (2,000 mg total) by mouth 2 (two) times daily. For 1 day (Patient not taking: Reported on 10/12/2016) 4 tablet 5   No facility-administered medications prior to visit.    Past Medical History:  Diagnosis Date  . Allergy   . Anxiety   . Asthma    as child  . Depression   . Panic attacks    Past Surgical History:  Procedure Laterality Date  . CHOLECYSTECTOMY    . KNEE ARTHROSCOPY     bilateral, multiple times  . KNEE ARTHROSCOPY W/ ACL RECONSTRUCTION     left  . TONSILLECTOMY     Social History   Social History  . Marital status: Married    Spouse name: N/A  . Number of children: 3  . Years of education: N/A   Occupational History  . artist [employer]   Social History Main Topics    . Smoking status: Never Smoker  . Smokeless tobacco: Never Used  . Alcohol use Yes     Comment: social 1-4  . Drug use: No  . Sexual activity: Yes    Birth control/ protection: Surgical   Other Topics Concern  . None   Social History Narrative   Lives with her husband and their daughter.  They share custody of his two daughters with their mother. Education: [employer].    Family History  Problem Relation Age of Onset  . Colon cancer Maternal Grandfather   . Heart disease Maternal Grandmother   . Thyroid disease Mother   . Hyperlipidemia Mother   . Irritable bowel syndrome Maternal Uncle        x 3  . Irritable bowel syndrome Cousin       Review of Systems: Pertinent positive and negative review of systems were noted in the above HPI section. All other review of systems were otherwise negative.   Physical Exam: General: Well developed, well nourished, no acute distress Head: Normocephalic and atraumatic Eyes:  sclerae anicteric, EOMI Ears: Normal auditory acuity Mouth: No deformity or lesions Neck: Supple, no masses or thyromegaly Lungs: Clear throughout to auscultation Heart: Regular rate and rhythm; no murmurs, rubs or bruits Abdomen: Soft, non tender and non distended. No masses, hepatosplenomegaly or  hernias noted. Normal Bowel sounds Rectal: no lesions, no tenderness, heme neg soft brown stool Musculoskeletal: Symmetrical with no gross deformities  Skin: No lesions on visible extremities Pulses:  Normal pulses noted Extremities: No clubbing, cyanosis, edema or deformities noted Neurological: Alert oriented x 4, grossly nonfocal Cervical Nodes:  No significant cervical adenopathy Inguinal Nodes: No significant inguinal adenopathy Psychological:  Alert and cooperative. Normal mood and affect  Assessment and Recommendations:  1. Hemorrhoids with intermittent thrombosis. Possibly internal hemorrhoids or resolved external hemorrhoids. Advised colonoscopy for further  evaluation of possible internal hemorrhoids and to exclude other colorectal pathology and then consideration of hemorrhoidal banding if she would like prophylactic measures to help prevent recurrent hemorrhoidal complications. The other option is to wait and see if she has recurrent symptoms which is reasonable as well. She states she will consider these options but for now she is planning to wait and see if she has more problems. Follow up as needed.   2. CRC screening, average risk. Recommended at age 69 unless she proceeds with colonoscopy earlier.    cc: Shawnee Knapp, MD [address] [place], [place] [address]

## 2016-10-12 NOTE — Patient Instructions (Signed)
If you decide you want to schedule a Colonoscopy, please contact our office at [phone].  Thank you for choosing me and [facility].  Pricilla Riffle. Dagoberto Ligas., MD., Marval Regal

## 2016-11-22 ENCOUNTER — Encounter: Payer: Self-pay | Admitting: Gastroenterology

## 2016-12-13 LAB — HM MAMMOGRAPHY

## 2016-12-17 ENCOUNTER — Encounter: Payer: Self-pay | Admitting: Family Medicine

## 2016-12-17 ENCOUNTER — Ambulatory Visit (INDEPENDENT_AMBULATORY_CARE_PROVIDER_SITE_OTHER): Payer: BC Managed Care – PPO | Admitting: Family Medicine

## 2016-12-17 VITALS — BP 114/69 | HR 72 | Temp 98.1°F | Resp 18 | Ht 67.87 in | Wt 178.2 lb

## 2016-12-17 DIAGNOSIS — Z1383 Encounter for screening for respiratory disorder NEC: Secondary | ICD-10-CM

## 2016-12-17 DIAGNOSIS — R350 Frequency of micturition: Secondary | ICD-10-CM | POA: Diagnosis not present

## 2016-12-17 DIAGNOSIS — M25542 Pain in joints of left hand: Secondary | ICD-10-CM

## 2016-12-17 DIAGNOSIS — M25541 Pain in joints of right hand: Secondary | ICD-10-CM | POA: Diagnosis not present

## 2016-12-17 DIAGNOSIS — Z124 Encounter for screening for malignant neoplasm of cervix: Secondary | ICD-10-CM

## 2016-12-17 DIAGNOSIS — Z1231 Encounter for screening mammogram for malignant neoplasm of breast: Secondary | ICD-10-CM | POA: Diagnosis not present

## 2016-12-17 DIAGNOSIS — F411 Generalized anxiety disorder: Secondary | ICD-10-CM

## 2016-12-17 DIAGNOSIS — Z1329 Encounter for screening for other suspected endocrine disorder: Secondary | ICD-10-CM

## 2016-12-17 DIAGNOSIS — Z8619 Personal history of other infectious and parasitic diseases: Secondary | ICD-10-CM | POA: Diagnosis not present

## 2016-12-17 DIAGNOSIS — Z23 Encounter for immunization: Secondary | ICD-10-CM

## 2016-12-17 DIAGNOSIS — Z113 Encounter for screening for infections with a predominantly sexual mode of transmission: Secondary | ICD-10-CM | POA: Diagnosis not present

## 2016-12-17 DIAGNOSIS — Z136 Encounter for screening for cardiovascular disorders: Secondary | ICD-10-CM | POA: Diagnosis not present

## 2016-12-17 DIAGNOSIS — N898 Other specified noninflammatory disorders of vagina: Secondary | ICD-10-CM

## 2016-12-17 DIAGNOSIS — Z130 Encounter for screening for diseases of the blood and blood-forming organs and certain disorders involving the immune mechanism: Secondary | ICD-10-CM | POA: Diagnosis not present

## 2016-12-17 DIAGNOSIS — Z0000 Encounter for general adult medical examination without abnormal findings: Secondary | ICD-10-CM | POA: Diagnosis not present

## 2016-12-17 DIAGNOSIS — Z1389 Encounter for screening for other disorder: Secondary | ICD-10-CM

## 2016-12-17 LAB — POCT URINALYSIS DIP (MANUAL ENTRY)
Bilirubin, UA: NEGATIVE
GLUCOSE UA: NEGATIVE mg/dL
Ketones, POC UA: NEGATIVE mg/dL
Leukocytes, UA: NEGATIVE
NITRITE UA: NEGATIVE
PH UA: 5.5 (ref 5.0–8.0)
PROTEIN UA: NEGATIVE mg/dL
RBC UA: NEGATIVE
Spec Grav, UA: >=1.03 — AB (ref 1.010–1.025)
UROBILINOGEN UA: 0.2 U/dL

## 2016-12-17 LAB — POC MICROSCOPIC URINALYSIS (UMFC): MUCUS RE: ABSENT

## 2016-12-17 LAB — POCT WET + KOH PREP
Trich by wet prep: ABSENT
Yeast by KOH: ABSENT
Yeast by wet prep: ABSENT

## 2016-12-17 MED ORDER — VALACYCLOVIR HCL 1 G PO TABS: 2000.0000 mg | ORAL_TABLET | Freq: Two times a day (BID) | ORAL | 2 refills | Status: DC

## 2016-12-17 MED ORDER — SERTRALINE HCL 50 MG PO TABS: 75.0000 mg | ORAL_TABLET | Freq: Every day | ORAL | 3 refills | Status: DC

## 2016-12-17 MED ORDER — ALPRAZOLAM 0.5 MG PO TABS: ORAL_TABLET | ORAL | 3 refills | Status: DC

## 2016-12-17 NOTE — Patient Instructions (Addendum)
IF you received an x-ray today, you will receive an invoice from [facility]. Please contact [facility] at [phone] with questions or concerns regarding your invoice.   IF you received labwork today, you will receive an invoice from [facility]. Please contact [facility] at [phone] with questions or concerns regarding your invoice.   Our billing staff will not be able to assist you with questions regarding bills from these companies.  You will be contacted with the lab results as soon as they are available. The fastest way to get your results is to activate your My Chart account. Instructions are located on the last page of this paperwork. If you have not heard from [place] regarding the results in 2 weeks, please contact this office.    Joint Pain Joint pain, which is also called arthralgia, can be caused by many things. Joint pain often goes away when you follow your health care provider's instructions for relieving pain at home. However, joint pain can also be caused by conditions that require further treatment. Common causes of joint pain include:  Bruising in the area of the joint.  Overuse of the joint.  Wear and tear on the joints that occur with aging (osteoarthritis).  Various other forms of arthritis.  A buildup of a crystal form of uric acid in the joint (gout).  Infections of the joint (septic arthritis) or of the bone (osteomyelitis).  Your health care provider may recommend medicine to help with the pain. If your joint pain continues, additional tests may be needed to diagnose your condition. Follow these instructions at home: Watch your condition for any changes. Follow these instructions as directed to lessen the pain that you are feeling.  Take medicines only as directed by your health care provider.  Rest the affected area for as long as your health care provider says that you should. If directed to do so, raise the painful joint above the  level of your heart while you are sitting or lying down.  Do not do things that cause or worsen pain.  If directed, apply ice to the painful area: ? Put ice in a plastic bag. ? Place a towel between your skin and the bag. ? Leave the ice on for 20 minutes, 2-3 times per day.  Wear an elastic bandage, splint, or sling as directed by your health care provider. Loosen the elastic bandage or splint if your fingers or toes become numb and tingle, or if they turn cold and blue.  Begin exercising or stretching the affected area as directed by your health care provider. Ask your health care provider what types of exercise are safe for you.  Keep all follow-up visits as directed by your health care provider. This is important.  Contact a health care provider if:  Your pain increases, and medicine does not help.  Your joint pain does not improve within 3 days.  You have increased bruising or swelling.  You have a fever.  You lose 10 lb (4.5 kg) or more without trying. Get help right away if:  You are not able to move the joint.  Your fingers or toes become numb or they turn cold and blue. This information is not intended to replace advice given to you by your health care provider. Make sure you discuss any questions you have with your health care provider. Document Released: 05/10/2005 Document Revised: 10/10/2015 Document Reviewed: 02/19/2014 Elsevier Interactive Patient Education  2018 [place].  Calcium Intake Recommendations Calcium  is a mineral that affects many functions in the body, including:  Blood clotting.  Blood vessel function.  Nerve impulse conduction.  Hormone secretion.  Muscle contraction.  Bone and teeth functions.  Most of your body's calcium supply is stored in your bones and teeth. When your calcium stores are low, you may be at risk for low bone mass, bone loss, and bone fractures. Consuming enough calcium helps to grow healthy bones and teeth and  to prevent breakdown over time. It is very important that you get enough calcium if you are:  A child undergoing rapid growth.  An adolescent girl.  A pre- or post-menopausal woman.  A woman whose menstrual cycle has stopped due to anorexia nervosa or regular intense exercise.  An individual with lactose intolerance or a milk allergy.  A vegetarian.  What is my plan? Try to consume the recommended amount of calcium daily based on your age. Depending on your overall health, your health care provider may recommend increased calcium intake.General daily calcium intake recommendations by age are:  Birth to 6 months: 200 mg.  Infants 7 to 12 months: 260 mg.  Children 1 to 3 years: 700 mg.  Children 4 to 8 years: 1,000 mg.  Children 9 to 13 years: 1,300 mg.  Teens 14 to 18 years: 1,300 mg.  Adults 19 to 50 years: 1,000 mg.  Adult women 51 to 70 years: 1,200 mg.  Adult men 51 to 70 years: 1,000 mg.  Adults 71 years and older: 1,200 mg.  Pregnant and breastfeeding teens: 1,300 mg.  Pregnant and breastfeeding adults: 1,000 mg.  What do I need to know about calcium intake?  In order for the body to absorb calcium, it needs vitamin D. You can get vitamin D through: ? Direct exposure of the skin to sunlight. ? Foods, such as egg yolks, liver, saltwater fish, and fortified milk. ? Supplements.  Consuming too much calcium may cause: ? Constipation. ? Decreased absorption of iron and zinc. ? Kidney stones.  Calcium supplements may interact with certain medicines. Check with your health care provider before starting any calcium supplements.  Try to get most of your calcium from food. What foods can I eat? Grains  Fortified oatmeal. Fortified ready-to-eat cereals. Fortified frozen waffles. Vegetables Turnip greens. Broccoli. Fruits Fortified orange juice. Meats and Other Protein Sources Canned sardines with bones. Canned salmon with bones. Soy beans. Tofu. Baked  beans. Almonds. [place] nuts. Sunflower seeds. Dairy Milk. Yogurt. Cheese. Cottage cheese. Beverages Fortified soy milk. Fortified rice milk. Sweets/Desserts Pudding. Ice Cream. Milkshakes. Blackstrap molasses. The items listed above may not be a complete list of recommended foods or beverages. Contact your dietitian for more options. What foods can affect my calcium intake? It may be more difficult for your body to use calcium or calcium may leave your body more quickly if you consume large amounts of:  Sodium.  Protein.  Caffeine.  Alcohol.  This information is not intended to replace advice given to you by your health care provider. Make sure you discuss any questions you have with your health care provider. Document Released: 12/23/2003 Document Revised: 11/28/2015 Document Reviewed: 10/16/2013 Elsevier Interactive Patient Education  2018 [employer].

## 2016-12-17 NOTE — Progress Notes (Signed)
Subjective:  By signing my name below, I, Debbie Shaw, attest that this documentation has been prepared under the direction and in the presence of Debbie Cheadle, MD. Electronically Signed: Moises Shaw, Cyril. 12/17/2016 , 2:41 PM .  Patient was seen in Room 9 .   Patient ID: Debbie Shaw, female    DOB: 1970/09/20, 46 y.o.   MRN: [id number] Chief Complaint  Patient presents with  . Annual Exam    CPE   HPI Debbie Shaw is a 46 y.o. female who presents to Primary Care at [facility] for annual physical. 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   Hand joints hurting She complains of hands fingers feeling achy after shaking hands with people. She noticed pain in both hands, but right worse than left. She notes some puffiness. She denies any change in her diet.   Urinary Frequency & Urgency She also endorsed urinary frequency and urgency.   Insomnia & anxiety Patient is taking alprazolam 2-3 times a week, alternating with melatonin and diphenhydramine. Discussed trial of trazodone in the past in case xanax use increases.   She also has history of panic attacks and anxiety. She's taken zoloft intermittently and seen therapist for managing her anxiety.   She reports not having a panic attack since her last one here in March. She's taken trazodone once but felt terrible with it. She is still using Z-quil to help with sleep. She hasn't been taking xanax for sleep, only for when she feels "super anxious".   She's doing well on zoloft 75mg  and feeling better. However, she still feels "up and down" sometimes, as she's still having drama with her teenagers at home, but notes "things are better in the big picture."   Cancer screening She had abnormal pap smear in the past requiring colposcopy and biopsy, but sometime since 2005, she has had multiple normal paps  since then.  Maternal grandfather had history of colon cancer and died in his 31s. Patient's mother has had normal colonoscopies.  She had mammogram done a week ago.   She also has history of herpes labialis, and taken valtrex. She also has history of chronic upper costochondritis, followed by Dr. Robinette Haines.   Vision: She doesn't wear corrective lenses, but has started wearing reader's glasses.  Dental: She will have teeth cleaned next week.   Past Medical History:  Diagnosis Date  . Abnormal Pap smear of cervix    Since 2005 with biopsy under colposcopy  . Allergy   . Anxiety   . Asthma    as child  . Depression   . Panic attacks    Prior to Admission medications   Medication Sig Start Date End Date Taking? Authorizing Provider  ALPRAZolam Duanne Moron) 0.5 MG tablet For panic symptoms only. 07/21/16  Yes Tereasa Coop, PA-C  ibuprofen (ADVIL,MOTRIN) 200 MG tablet Take 200 mg by mouth as needed.   Yes [provider]  sertraline (ZOLOFT) 25 MG tablet Take 1 tablet (25 mg total) by mouth daily. 07/21/16  Yes Tereasa Coop, PA-C  sertraline (ZOLOFT) 50 MG tablet Take 1.5 tablets (75 mg total) by mouth daily. 09/25/16 09/25/17 Yes Tereasa Coop, PA-C  traZODone (DESYREL) 50 MG tablet Take 0.5-1 tablets (25-50 mg total) by mouth at bedtime as needed for sleep. 07/21/16  Yes Tereasa Coop, PA-C  valACYclovir Estell Harpin)  1000 MG tablet Take 2 tablets (2,000 mg total) by mouth 2 (two) times daily. For 1 day 04/09/15  Yes Ivar Drape D, [place]   Allergies  Allergen Reactions  . Morphine And Related Nausea And Vomiting  . 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 Sulfa Antibiotics Nausea And Vomiting   Past Surgical History:  Procedure Laterality Date  . CHOLECYSTECTOMY    . KNEE ARTHROSCOPY     bilateral, multiple times  . KNEE ARTHROSCOPY W/ ACL RECONSTRUCTION     left  . TONSILLECTOMY     Family History    Problem Relation Age of Onset  . Colon cancer Maternal Grandfather   . Heart disease Maternal Grandmother   . Thyroid disease Mother   . Hyperlipidemia Mother   . Irritable bowel syndrome Maternal Uncle        x 3  . Irritable bowel syndrome Cousin    Social History   Social History  . Marital status: Married    Spouse name: N/A  . Number of children: 3  . Years of education: N/A   Occupational History  . artist [employer]   Social History Main Topics  . Smoking status: Never Smoker  . Smokeless tobacco: Never Used  . Alcohol use Yes     Comment: social 1-4  . Drug use: No  . Sexual activity: Yes    Birth control/ protection: Surgical   Other Topics Concern  . None   Social History Narrative   Lives with her husband and their daughter.  They share custody of his two daughters with their mother. Education: [employer].    Depression screen [facility] 2/9 12/17/2016 08/07/2016 07/21/2016 12/08/2015 04/09/2015  Decreased Interest 0 0 0 0 0  Down, Depressed, Hopeless 0 0 1 0 0  PHQ - 2 Score 0 0 1 0 0  Altered sleeping - - 1 - -  Tired, decreased energy - - 0 - -  Change in appetite - - 0 - -  Feeling bad or failure about yourself  - - 0 - -  Trouble concentrating - - 0 - -  Moving slowly or fidgety/restless - - 0 - -  Suicidal thoughts - - 0 - -  PHQ-9 Score - - 2 - -    Review of Systems  Constitutional: Negative for chills, fatigue, fever and unexpected weight change.  Respiratory: Negative for cough.   Gastrointestinal: Negative for constipation, diarrhea, nausea and vomiting.  Genitourinary: Positive for frequency and urgency.  Musculoskeletal: Positive for arthralgias.  Skin: Negative for rash and wound.  Neurological: Negative for dizziness, weakness and headaches.       Objective:   Physical Exam  Constitutional: She is oriented to person, place, and time. She appears well-developed and well-nourished. No distress.  HENT:  Head: Normocephalic and atraumatic.   Eyes: Pupils are equal, round, and reactive to light. EOM are normal.  Neck: Neck supple.  Cardiovascular: Normal rate.   Pulmonary/Chest: Effort normal. No respiratory distress.  Breast exam: normal  Abdominal: Soft. She exhibits no distension. There is tenderness (diffuse, mild).  Genitourinary:  Genitourinary Comments: Moderate amount of thick yellow mucosal discharge, cervix with bright red bleeding upon manipulation but patient reports finishing menses yesterday. She had a 58mm erythematous non friable nodule at 12 o'clock on cervix; uterus and adnexa exam were benign   Musculoskeletal: Normal range of motion.  Neurological: She is alert and  oriented to person, place, and time.  Skin: Skin is warm and dry.  Psychiatric: She has a normal mood and affect. Her behavior is normal.  Nursing note and vitals reviewed.   BP 114/69 (BP Location: Right Arm, Patient Position: Sitting, Cuff Size: Normal)   Pulse 72   Temp 98.1 F (36.7 C) (Oral)   Resp 18   Ht 5' 7.87" (1.724 m)   Wt 178 lb 3.2 oz (80.8 kg)   LMP 12/12/2016 (Exact Date)   SpO2 96%   BMI 27.20 kg/m    Visual Acuity Screening   Right eye Left eye Both eyes  Without correction: 20/20 20/40 20/20   With correction:      Results for orders placed or performed in visit on 12/17/16  POCT urinalysis dipstick  Result Value Ref Range   Color, UA yellow yellow   Clarity, UA clear clear   Glucose, UA negative negative mg/dL   Bilirubin, UA negative negative   Ketones, POC UA negative negative mg/dL   Spec Grav, UA >=1.030 (A) 1.010 - 1.025   Shaw, UA negative negative   pH, UA 5.5 5.0 - 8.0   Protein Ur, POC negative negative mg/dL   Urobilinogen, UA 0.2 0.2 or 1.0 E.U./dL   Nitrite, UA Negative Negative   Leukocytes, UA Negative Negative  POCT Microscopic Urinalysis ([facility])  Result Value Ref Range   WBC,UR,HPF,POC None None WBC/hpf   RBC,UR,HPF,POC None None RBC/hpf   Bacteria Many (A) None, Too numerous to count    Mucus Absent Absent   Epithelial Cells, UR Per Microscopy Few (A) None, Too numerous to count cells/hpf  POCT Wet + KOH Prep  Result Value Ref Range   Yeast by KOH Absent Absent   Yeast by wet prep Absent Absent   WBC by wet prep None (A) Few   Clue Cells Wet Prep HPF POC None None   Trich by wet prep Absent Absent   Bacteria Wet Prep HPF POC Few Few   Epithelial Cells By [employer] Pref ([facility]) Few None, Few, Too numerous to count   RBC,UR,HPF,POC None None RBC/hpf        Assessment & Plan:   1. Annual physical exam   2. Routine screening for STI (sexually transmitted infection)   3. Encounter for screening mammogram for breast cancer   4. Screening for cardiovascular, respiratory, and genitourinary diseases   5. Screening for cervical cancer - ok to return to normal screening if negative  6. Screening for deficiency anemia   7. Screening for thyroid disorder   8. Arthralgia of both hands - check inflammatory labs  9. GAD (generalized anxiety disorder) - rare prn xanax  10. H/O cold sores    Mood d/o stable - cont zoloft  11. Urinary frequency - clx P  12. Vaginal discharge - not really symptomatic, likely just after menses, no need to treat    Orders Placed This Encounter  Procedures  . Urine Culture  . Tdap vaccine greater than or equal to 7yo IM  . Lipid panel    Order Specific Question:   Has the patient fasted?    Answer:   Yes  . Comprehensive metabolic panel    Order Specific Question:   Has the patient fasted?    Answer:   Yes  . CBC  . TSH  . Sedimentation Rate  . Uric A+ANA+RA Qn+CRP+ASO  . Care order/instruction:    Scheduling Instructions:     Complete orders, AVS and go.  Marland Kitchen  POCT urinalysis dipstick  . POCT Microscopic Urinalysis ([facility])  . POCT Wet + KOH Prep    Meds ordered this encounter  Medications  . sertraline (ZOLOFT) 50 MG tablet    Sig: Take 1.5 tablets (75 mg total) by mouth daily.    Dispense:  135 tablet    Refill:  3  . ALPRAZolam (XANAX)  0.5 MG tablet    Sig: For panic symptoms only.    Dispense:  20 tablet    Refill:  3  . valACYclovir (VALTREX) 1000 MG tablet    Sig: Take 2 tablets (2,000 mg total) by mouth 2 (two) times daily. For 1 day    Dispense:  20 tablet    Refill:  2    I personally performed the services described in this documentation, which was scribed in my presence. The recorded information has been reviewed and considered, and addended by me as needed.   Debbie Shaw, M.D.  Primary Care at [facility] [address] [place], [place] [address] [phone] phone [phone] fax  12/17/16 6:53 PM

## 2016-12-18 LAB — CBC
HEMATOCRIT: 40.5 % (ref 34.0–46.6)
Hemoglobin: 14 g/dL (ref 11.1–15.9)
MCH: 31.3 pg (ref 26.6–33.0)
MCHC: 34.6 g/dL (ref 31.5–35.7)
MCV: 91 fL (ref 79–97)
PLATELETS: 249 10*3/uL (ref 150–379)
RBC: 4.47 x10E6/uL (ref 3.77–5.28)
RDW: 12.8 % (ref 12.3–15.4)
WBC: 7.2 10*3/uL (ref 3.4–10.8)

## 2016-12-18 LAB — COMPREHENSIVE METABOLIC PANEL
A/G RATIO: 2.2 (ref 1.2–2.2)
ALK PHOS: 45 IU/L (ref 39–117)
ALT: 23 IU/L (ref 0–32)
AST: 23 IU/L (ref 0–40)
Albumin: 4.3 g/dL (ref 3.5–5.5)
BUN/Creatinine Ratio: 14 (ref 9–23)
BUN: 14 mg/dL (ref 6–24)
Bilirubin Total: 0.4 mg/dL (ref 0.0–1.2)
CALCIUM: 9.2 mg/dL (ref 8.7–10.2)
CHLORIDE: 101 mmol/L (ref 96–106)
CO2: 25 mmol/L (ref 20–29)
CREATININE: 1.02 mg/dL — AB (ref 0.57–1.00)
GFR calc Af Amer: 77 mL/min/{1.73_m2} (ref >59)
GFR calc non Af Amer: 67 mL/min/{1.73_m2} (ref >59)
Globulin, Total: 2 g/dL (ref 1.5–4.5)
Glucose: 83 mg/dL (ref 65–99)
Potassium: 4.4 mmol/L (ref 3.5–5.2)
SODIUM: 141 mmol/L (ref 134–144)
Total Protein: 6.3 g/dL (ref 6.0–8.5)

## 2016-12-18 LAB — TSH: TSH: 2.46 u[IU]/mL (ref 0.450–4.500)

## 2016-12-18 LAB — URINE CULTURE

## 2016-12-18 LAB — LIPID PANEL
CHOL/HDL RATIO: 3.9 ratio (ref 0.0–4.4)
Cholesterol, Total: 216 mg/dL — ABNORMAL HIGH (ref 100–199)
HDL: 56 mg/dL (ref >39)
LDL Calculated: 145 mg/dL — ABNORMAL HIGH (ref 0–99)
Triglycerides: 76 mg/dL (ref 0–149)
VLDL Cholesterol Cal: 15 mg/dL (ref 5–40)

## 2016-12-18 LAB — URIC A+ANA+RA QN+CRP+ASO
ANA: NEGATIVE
ASO: 188 [IU]/mL (ref 0.0–200.0)
CRP: 0.4 mg/L (ref 0.0–4.9)
Rhuematoid fact SerPl-aCnc: <10 IU/mL (ref 0.0–13.9)
URIC ACID: 3.2 mg/dL (ref 2.5–7.1)

## 2016-12-18 LAB — SEDIMENTATION RATE: Sed Rate: 2 mm/hr (ref 0–32)

## 2016-12-21 LAB — PAP IG, CT-NG NAA, HPV HIGH-RISK
Chlamydia, Nuc. Acid Amp: NEGATIVE
Gonococcus by Nucleic Acid Amp: NEGATIVE
HPV, high-risk: NEGATIVE
PAP Smear Comment: 0

## 2016-12-23 ENCOUNTER — Encounter: Payer: Self-pay | Admitting: Family Medicine

## 2017-01-31 ENCOUNTER — Ambulatory Visit (AMBULATORY_SURGERY_CENTER): Payer: BC Managed Care – PPO | Admitting: *Deleted

## 2017-01-31 VITALS — Ht 68.5 in | Wt 181.0 lb

## 2017-01-31 DIAGNOSIS — Z1212 Encounter for screening for malignant neoplasm of rectum: Secondary | ICD-10-CM

## 2017-01-31 DIAGNOSIS — K645 Perianal venous thrombosis: Secondary | ICD-10-CM

## 2017-01-31 DIAGNOSIS — Z1211 Encounter for screening for malignant neoplasm of colon: Secondary | ICD-10-CM

## 2017-01-31 MED ORDER — NA SULFATE-K SULFATE-MG SULF 17.5-3.13-1.6 GM/177ML PO SOLN: ORAL | 0 refills | Status: DC

## 2017-01-31 NOTE — Progress Notes (Signed)
Patient denies any allergies to eggs or soy. Patient denies any problems with anesthesia/sedation. Patient denies any oxygen use at home and does not take any diet/weight loss medications. EMMI education assisgned to patient on colonoscopy, this was explained and instructions given to patient. 

## 2017-01-31 NOTE — Progress Notes (Signed)
Patient states after gallbladder sx she woke up from anesthesia crying and very upset. Denies this with other surgeries.

## 2017-02-01 ENCOUNTER — Encounter: Payer: Self-pay | Admitting: Gastroenterology

## 2017-02-07 ENCOUNTER — Encounter: Payer: Self-pay | Admitting: Gastroenterology

## 2017-02-07 ENCOUNTER — Ambulatory Visit (AMBULATORY_SURGERY_CENTER): Payer: BC Managed Care – PPO | Admitting: Gastroenterology

## 2017-02-07 VITALS — BP 108/65 | HR 52 | Temp 98.2°F | Resp 11 | Ht 68.5 in | Wt 181.0 lb

## 2017-02-07 DIAGNOSIS — D123 Benign neoplasm of transverse colon: Secondary | ICD-10-CM

## 2017-02-07 DIAGNOSIS — D125 Benign neoplasm of sigmoid colon: Secondary | ICD-10-CM

## 2017-02-07 DIAGNOSIS — K645 Perianal venous thrombosis: Secondary | ICD-10-CM | POA: Diagnosis not present

## 2017-02-07 DIAGNOSIS — K6289 Other specified diseases of anus and rectum: Secondary | ICD-10-CM | POA: Diagnosis present

## 2017-02-07 MED ORDER — SODIUM CHLORIDE 0.9 % IV SOLN: 500.0000 mL | INTRAVENOUS | Status: DC

## 2017-02-07 NOTE — Patient Instructions (Signed)
**   Handouts given on polyps and hemorrhoids today! **    YOU HAD AN ENDOSCOPIC PROCEDURE TODAY AT THE [facility]:   Refer to the procedure report that was given to you for any specific questions about what was found during the examination.  If the procedure report does not answer your questions, please call your gastroenterologist to clarify.  If you requested that your care partner not be given the details of your procedure findings, then the procedure report has been included in a sealed envelope for you to review at your convenience later.  YOU SHOULD EXPECT: Some feelings of bloating in the abdomen. Passage of more gas than usual.  Walking can help get rid of the air that was put into your GI tract during the procedure and reduce the bloating. If you had a lower endoscopy (such as a colonoscopy or flexible sigmoidoscopy) you may notice spotting of blood in your stool or on the toilet paper. If you underwent a bowel prep for your procedure, you may not have a normal bowel movement for a few days.  Please Note:  You might notice some irritation and congestion in your nose or some drainage.  This is from the oxygen used during your procedure.  There is no need for concern and it should clear up in a day or so.  SYMPTOMS TO REPORT IMMEDIATELY:   Following lower endoscopy (colonoscopy or flexible sigmoidoscopy):  Excessive amounts of blood in the stool  Significant tenderness or worsening of abdominal pains  Swelling of the abdomen that is new, acute  Fever of 100F or higher   For urgent or emergent issues, a gastroenterologist can be reached at any hour by calling [phone].   DIET:  We do recommend a small meal at first, but then you may proceed to your regular diet.  Drink plenty of fluids but you should avoid alcoholic beverages for 24 hours.  ACTIVITY:  You should plan to take it easy for the rest of today and you should NOT DRIVE or use heavy machinery until tomorrow  (because of the sedation medicines used during the test).    FOLLOW UP: Our staff will call the number listed on your records the next business day following your procedure to check on you and address any questions or concerns that you may have regarding the information given to you following your procedure. If we do not reach you, we will leave a message.  However, if you are feeling well and you are not experiencing any problems, there is no need to return our call.  We will assume that you have returned to your regular daily activities without incident.  If any biopsies were taken you will be contacted by phone or by letter within the next 1-3 weeks.  Please call us at [phone] if you have not heard about the biopsies in 3 weeks.    SIGNATURES/CONFIDENTIALITY: You and/or your care partner have signed paperwork which will be entered into your electronic medical record.  These signatures attest to the fact that that the information above on your After Visit Summary has been reviewed and is understood.  Full responsibility of the confidentiality of this discharge information lies with you and/or your care-partner.

## 2017-02-07 NOTE — Op Note (Signed)
[place] Patient Name: Debbie Shaw Procedure Date: 02/07/2017 8:01 AM MRN: [id number] Endoscopist: Ladene Artist , MD Age: 46 Referring MD:  Date of Birth: January 22, 1971 Gender: Female Account #: [id number] Procedure:                Colonoscopy Indications:              Rectal pain Medicines:                Monitored Anesthesia Care Procedure:                Pre-Anesthesia Assessment:                           - Prior to the procedure, a History and Physical                            was performed, and patient medications and                            allergies were reviewed. The patient's tolerance of                            previous anesthesia was also reviewed. The risks                            and benefits of the procedure and the sedation                            options and risks were discussed with the patient.                            All questions were answered, and informed consent                            was obtained. Prior Anticoagulants: The patient has                            taken no previous anticoagulant or antiplatelet                            agents. ASA Grade Assessment: II - A patient with                            mild systemic disease. After reviewing the risks                            and benefits, the patient was deemed in                            satisfactory condition to undergo the procedure.                           After obtaining informed consent, the colonoscope  was passed under direct vision. Throughout the                            procedure, the patient's blood pressure, pulse, and                            oxygen saturations were monitored continuously. The                            Model PCF-[id number]) scope was introduced                            through the anus and advanced to the the cecum,                            identified by appendiceal orifice and ileocecal                             valve. The ileocecal valve, appendiceal orifice,                            and rectum were photographed. The quality of the                            bowel preparation was good. The colonoscopy was                            performed without difficulty. The patient tolerated                            the procedure well. Scope In: 8:09:05 AM Scope Out: 8:26:09 AM Scope Withdrawal Time: 0 hours 13 minutes 57 seconds  Total Procedure Duration: 0 hours 17 minutes 4 seconds  Findings:                 The digital rectal exam findings include                            non-thrombosed external hemorrhoids.                           Two sessile polyps were found in the sigmoid colon                            and transverse colon. The polyps were 6 to 7 mm in                            size. These polyps were removed with a cold snare.                            Resection and retrieval were complete.                           Internal hemorrhoids were found during  retroflexion. The hemorrhoids were small and Grade                            I (internal hemorrhoids that do not prolapse).                           The exam was otherwise without abnormality on                            direct and retroflexion views. Complications:            No immediate complications. Estimated blood loss:                            None. Estimated Blood Loss:     Estimated blood loss: none. Impression:               - Non-thrombosed external hemorrhoids found on                            digital rectal exam.                           - Two 6 to 7 mm polyps in the sigmoid colon and in                            the transverse colon, removed with a cold snare.                            Resected and retrieved.                           - Internal hemorrhoids.                           - The examination was otherwise normal on direct                             and retroflexion views. Recommendation:           - Repeat colonoscopy in 5 years for surveillance if                            polyp(s) are precancerous, otherwise 10 years for                            screening.                           - Patient has a contact number available for                            emergencies. The signs and symptoms of potential                            delayed complications were discussed with the  patient. Return to normal activities tomorrow.                            Written discharge instructions were provided to the                            patient.                           - Resume previous diet.                           - Continue present medications.                           - Await pathology results. Ladene Artist, MD 02/07/2017 8:30:52 AM This report has been signed electronically.

## 2017-02-07 NOTE — Progress Notes (Signed)
Report to PACU, RN, vss, BBS= Clear.  

## 2017-02-07 NOTE — Progress Notes (Signed)
Called to room to assist during endoscopic procedure.  Patient ID and intended procedure confirmed with present staff. Received instructions for my participation in the procedure from the performing physician.  

## 2017-02-07 NOTE — Progress Notes (Signed)
No changes in medical or surgical hx since PV  Pt took sip of water with medicine at 600- CRNA made aware  Pt tearful and anxious about "being put to sleep."

## 2017-02-08 ENCOUNTER — Telehealth: Payer: Self-pay

## 2017-02-08 NOTE — Telephone Encounter (Signed)
  Follow up Call-  Call Josephanthony Tindel number 02/07/2017  Post procedure Call Wirt Hemmerich phone  # [phone]  Permission to leave phone message Yes  Some recent data might be hidden     Patient questions:  Do you have a fever, pain , or abdominal swelling? No. Pain Score  0 *  Have you tolerated food without any problems? Yes.    Have you been able to return to your normal activities? Yes.    Do you have any questions about your discharge instructions: Diet   No. Medications  No. Follow up visit  No.  Do you have questions or concerns about your Care? No.  Actions: * If pain score is 4 or above: No action needed, pain <4.

## 2017-02-17 ENCOUNTER — Encounter: Payer: Self-pay | Admitting: Gastroenterology

## 2017-02-21 ENCOUNTER — Ambulatory Visit (INDEPENDENT_AMBULATORY_CARE_PROVIDER_SITE_OTHER): Payer: BC Managed Care – PPO | Admitting: Family Medicine

## 2017-02-21 ENCOUNTER — Encounter: Payer: Self-pay | Admitting: Family Medicine

## 2017-02-21 VITALS — BP 92/62 | HR 72 | Temp 98.7°F | Resp 18 | Ht 68.0 in | Wt 178.2 lb

## 2017-02-21 DIAGNOSIS — R0789 Other chest pain: Secondary | ICD-10-CM | POA: Diagnosis not present

## 2017-02-21 DIAGNOSIS — J209 Acute bronchitis, unspecified: Secondary | ICD-10-CM

## 2017-02-21 MED ORDER — PROMETHAZINE-DM 6.25-15 MG/5ML PO SYRP: 5.0000 mL | ORAL_SOLUTION | Freq: Four times a day (QID) | ORAL | 0 refills | Status: DC | PRN

## 2017-02-21 MED ORDER — BENZONATATE 200 MG PO CAPS: 200.0000 mg | ORAL_CAPSULE | Freq: Three times a day (TID) | ORAL | 0 refills | Status: DC | PRN

## 2017-02-21 MED ORDER — AZITHROMYCIN 250 MG PO TABS
ORAL_TABLET | ORAL | 0 refills | Status: DC
Start: 2017-02-21 — End: 2017-09-12

## 2017-02-21 NOTE — Patient Instructions (Addendum)
   IF you received an x-ray today, you will receive an invoice from [facility]. Please contact [facility] at [phone] with questions or concerns regarding your invoice.   IF you received labwork today, you will receive an invoice from [facility]. Please contact [facility] at [phone] with questions or concerns regarding your invoice.   Our billing staff will not be able to assist you with questions regarding bills from these companies.  You will be contacted with the lab results as soon as they are available. The fastest way to get your results is to activate your My Chart account. Instructions are located on the last page of this paperwork. If you have not heard from us regarding the results in 2 weeks, please contact this office.      Acute Bronchitis, Adult Acute bronchitis is sudden (acute) swelling of the air tubes (bronchi) in the lungs. Acute bronchitis causes these tubes to fill with mucus, which can make it hard to breathe. It can also cause coughing or wheezing. In adults, acute bronchitis usually goes away within 2 weeks. A cough caused by bronchitis may last up to 3 weeks. Smoking, allergies, and asthma can make the condition worse. Repeated episodes of bronchitis may cause further lung problems, such as chronic obstructive pulmonary disease (COPD). What are the causes? This condition can be caused by germs and by substances that irritate the lungs, including:  Cold and flu viruses. This condition is most often caused by the same virus that causes a cold.  Bacteria.  Exposure to tobacco smoke, dust, fumes, and air pollution.  What increases the risk? This condition is more likely to develop in people who:  Have close contact with someone with acute bronchitis.  Are exposed to lung irritants, such as tobacco smoke, dust, fumes, and vapors.  Have a weak immune system.  Have a respiratory condition such as asthma.  What are the signs or  symptoms? Symptoms of this condition include:  A cough.  Coughing up clear, yellow, or green mucus.  Wheezing.  Chest congestion.  Shortness of breath.  A fever.  Body aches.  Chills.  A sore throat.  How is this diagnosed? This condition is usually diagnosed with a physical exam. During the exam, your health care provider may order tests, such as chest X-rays, to rule out other conditions. He or she may also:  Test a sample of your mucus for bacterial infection.  Check the level of oxygen in your blood. This is done to check for pneumonia.  Do a chest X-ray or lung function testing to rule out pneumonia and other conditions.  Perform blood tests.  Your health care provider will also ask about your symptoms and medical history. How is this treated? Most cases of acute bronchitis clear up over time without treatment. Your health care provider may recommend:  Drinking more fluids. Drinking more makes your mucus thinner, which may make it easier to breathe.  Taking a medicine for a fever or cough.  Taking an antibiotic medicine.  Using an inhaler to help improve shortness of breath and to control a cough.  Using a cool mist vaporizer or humidifier to make it easier to breathe.  Follow these instructions at home: Medicines  Take over-the-counter and prescription medicines only as told by your health care provider.  If you were prescribed an antibiotic, take it as told by your health care provider. Do not stop taking the antibiotic even if you start to feel better. General instructions    Get plenty of rest.  Drink enough fluids to keep your urine clear or pale yellow.  Avoid smoking and secondhand smoke. Exposure to cigarette smoke or irritating chemicals will make bronchitis worse. If you smoke and you need help quitting, ask your health care provider. Quitting smoking will help your lungs heal faster.  Use an inhaler, cool mist vaporizer, or humidifier as told  by your health care provider.  Keep all follow-up visits as told by your health care provider. This is important. How is this prevented? To lower your risk of getting this condition again:  Wash your hands often with soap and water. If soap and water are not available, use hand sanitizer.  Avoid contact with people who have cold symptoms.  Try not to touch your hands to your mouth, nose, or eyes.  Make sure to get the flu shot every year.  Contact a health care provider if:  Your symptoms do not improve in 2 weeks of treatment. Get help right away if:  You cough up blood.  You have chest pain.  You have severe shortness of breath.  You become dehydrated.  You faint or keep feeling like you are going to faint.  You keep vomiting.  You have a severe headache.  Your fever or chills gets worse. This information is not intended to replace advice given to you by your health care provider. Make sure you discuss any questions you have with your health care provider. Document Released: 06/17/2004 Document Revised: 12/03/2015 Document Reviewed: 10/29/2015 Elsevier Interactive Patient Education  2017 Elsevier Inc.  

## 2017-02-21 NOTE — Progress Notes (Signed)
   Subjective:    Patient ID: Debbie Shaw, female    DOB: [birth year], 46 y.o.   MRN: [id number]  HPI   Had 2 polyps adematous - so repeat in 5 yrs Review of Systems     Objective:   Physical Exam    BP 92/62   Pulse 72   Temp 98.7 F (37.1 C) (Oral)   Resp 18   Ht 5\' 8"  (1.727 m)   Wt 178 lb 3.2 oz (80.8 kg)   LMP 02/07/2017   SpO2 99%   BMI 27.10 kg/m      Assessment & Plan:   1. Acute bronchitis, unspecified organism   2. Acute chest wall pain    Meds ordered this encounter  Medications  . azithromycin (ZITHROMAX) 250 MG tablet    Sig: Take 2 tabs PO x 1 dose, then 1 tab PO QD x 4 days    Dispense:  6 tablet    Refill:  0  . benzonatate (TESSALON) 200 MG capsule    Sig: Take 1 capsule (200 mg total) by mouth 3 (three) times daily as needed for cough.    Dispense:  30 capsule    Refill:  0  . promethazine-dextromethorphan (PROMETHAZINE-DM) 6.25-15 MG/5ML syrup    Sig: Take 5 mLs by mouth 4 (four) times daily as needed for cough.    Dispense:  180 mL    Refill:  0    Delman Cheadle, M.D.  Primary Care at [facility] [address] [place], [place] [address] [phone] phone [phone] fax  02/21/17 12:39 PM

## 2017-04-11 ENCOUNTER — Other Ambulatory Visit: Payer: Self-pay | Admitting: Family Medicine

## 2017-04-11 ENCOUNTER — Telehealth: Payer: Self-pay | Admitting: Family Medicine

## 2017-04-11 DIAGNOSIS — F411 Generalized anxiety disorder: Secondary | ICD-10-CM

## 2017-04-11 NOTE — Telephone Encounter (Signed)
Reviewed [facility] - pt did not fill her last rx I wrote in 11/2016- last xanax refill was #20 tab in 06/2016.  Refill printed to be called or faxed in to pharm. Left in Tasha's box.

## 2017-04-11 NOTE — Telephone Encounter (Signed)
Copied from [place] [phone]. Topic: Quick Communication - See Telephone Encounter >> Apr 11, 2017 11:31 AM Patrice Paradise wrote: Patient lost her paper copy of her Rx for Alprazolam (Xanax) .05 mg. Patient is going out of town today and would like for Dr. Brigitte Pulse to send a copy of the Rx to her pharmacy.   [facility] [address] [place], [place] - [address] [phone]  CRM for notification. See Telephone encounter for: 04/11/17.

## 2017-04-11 NOTE — Telephone Encounter (Signed)
Request for controlled substance 

## 2017-04-12 NOTE — Telephone Encounter (Signed)
Spoke with Aniceto Boss and Rx was faxed last night.

## 2017-04-12 NOTE — Telephone Encounter (Signed)
Please advise 

## 2017-04-12 NOTE — Telephone Encounter (Signed)
I printed off a copy last night with the note requested that it be faxed or called into her pharmacy. I put it in Tasha's box. (and this would have been visible by checking the medication tab as well so really didn't need to be sent to me to be handled - just FYI for emergencies such as this.).  Since pt is now likely out of town since message was not handled same day, please call her and see if she would like one rx w/o refills be sent into a different pharmacy near her.

## 2017-09-12 ENCOUNTER — Other Ambulatory Visit: Payer: Self-pay

## 2017-09-12 ENCOUNTER — Encounter: Payer: Self-pay | Admitting: Physician Assistant

## 2017-09-12 ENCOUNTER — Ambulatory Visit: Payer: BC Managed Care – PPO | Admitting: Physician Assistant

## 2017-09-12 VITALS — BP 124/80 | HR 72 | Temp 97.6°F | Resp 16 | Ht 68.5 in | Wt 188.0 lb

## 2017-09-12 DIAGNOSIS — J029 Acute pharyngitis, unspecified: Secondary | ICD-10-CM | POA: Diagnosis not present

## 2017-09-12 DIAGNOSIS — R5383 Other fatigue: Secondary | ICD-10-CM

## 2017-09-12 LAB — POCT CBC
Granulocyte percent: 69 % (ref 37–80)
HCT, POC: 39.5 % (ref 37.7–47.9)
Hemoglobin: 13.5 g/dL (ref 12.2–16.2)
Lymph, poc: 1.3 (ref 0.6–3.4)
MCH, POC: 30.8 pg (ref 27–31.2)
MCHC: 34.1 g/dL (ref 31.8–35.4)
MCV: 90.1 fL (ref 80–97)
MID (cbc): 0.5 (ref 0–0.9)
MPV: 7 fL (ref 0–99.8)
POC Granulocyte: 4.1 (ref 2–6.9)
POC LYMPH PERCENT: 22.5 %L (ref 10–50)
POC MID %: 8.5 %M (ref 0–12)
Platelet Count, POC: 266 10*3/uL (ref 142–424)
RBC: 4.39 M/uL (ref 4.04–5.48)
RDW, POC: 12.3 %
WBC: 5.9 10*3/uL (ref 4.6–10.2)

## 2017-09-12 LAB — POCT RAPID STREP A (OFFICE): Rapid Strep A Screen: NEGATIVE

## 2017-09-12 NOTE — Patient Instructions (Addendum)
Your rapid strep is negative.  For allergies, try switching up your medications -  Xyzal or Claritin-D (you have to buy Claritin-D from your pharmacist) also use flonase.   We will contact you with the results of today's blood work.   Stay well hydrated. Get lost of rest. Wash your hands often.   -Foods that can help speed recovery: honey, garlic, chicken soup, elderberries, green tea.  -Supplements that can help speed recovery: vitamin C, zinc, elderberry extract, quercetin, ginseng, selenium -Supplement with prebiotics and probiotics:   Advil or ibuprofen for pain. Do not take Aspirin.  Drink enough water and fluids to keep your urine clear or pale yellow.  For sore throat: ? Gargle with 8 oz of salt water ( tsp of salt per 1 qt of water) as often as every 1-2 hours to soothe your throat.  Gargle liquid benadryl.  Cepacol throat lozenges (if you are not at risk for choking).  For sore throat try using a honey-based tea. Use 3 teaspoons of honey with juice squeezed from half lemon. Place shaved pieces of ginger into 1/2-1 cup of water and warm over stove top. Then mix the ingredients and repeat every 4 hours as needed.   Thank you for coming in today. I hope you feel we met your needs.  Feel free to call PCP if you have any questions or further requests.  Please consider signing up for MyChart if you do not already have it, as this is a great way to communicate with me.  Best,  Whitney McVey, PA-C  IF you received an x-ray today, you will receive an invoice from [facility]. Please contact [facility] at [phone] with questions or concerns regarding your invoice.   IF you received labwork today, you will receive an invoice from [facility]. Please contact [facility] at [phone] with questions or concerns regarding your invoice.   Our billing staff will not be able to assist you with questions regarding bills from these companies.  You will be contacted with  the lab results as soon as they are available. The fastest way to get your results is to activate your My Chart account. Instructions are located on the last page of this paperwork. If you have not heard from [place] regarding the results in 2 weeks, please contact this office.

## 2017-09-12 NOTE — Progress Notes (Signed)
Debbie Shaw  MRN: [id number] DOB: [birth year]  PCP: Shawnee Knapp, MD  Subjective:  Pt is a 47 year old female PMH anxiety, HSV who presents to clinic for sore throat x 4 weeks. Pain is worse R>L.   Today she c/o being "super tired". Honey drops and tea are helping.  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  4 weeks ago she got sick, the left side of her face swelled up. She was sneezing, runny nose - this has improved.   She had mono in college.  H/o seasonal allergies - she is taking allergy medication daily.   Review of Systems  Constitutional: Positive for fatigue. Negative for chills, diaphoresis and fever.  HENT: Positive for postnasal drip and sore throat. Negative for congestion, drooling, trouble swallowing and voice change.   Gastrointestinal: Negative for abdominal pain, nausea and vomiting.    Patient Active Problem List   Diagnosis Date Noted  . 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 Anxiety 01/30/2013  . FIBROCYSTIC BREAST DISEASE 02/10/2009  . FATIGUE 02/10/2009    Current Outpatient Medications on File Prior to Visit  Medication Sig Dispense Refill  . ALPRAZolam (XANAX) 0.5 MG tablet Take 1 tablet (0.5 mg total) 2 (two) times daily as needed by mouth for anxiety. 30 tablet 2  . cetirizine (ZYRTEC) 10 MG tablet Take 10 mg by mouth daily.    . DiphenhydrAMINE HCl, Sleep, (ZZZQUIL PO) Take 1 tablet by mouth at bedtime.    Marland Kitchen ibuprofen (ADVIL,MOTRIN) 200 MG tablet Take 200 mg by mouth as needed.    . Multiple Vitamin (MULTIVITAMIN) tablet Take 1 tablet by mouth daily.    . sertraline (ZOLOFT) 50 MG tablet Take 1.5 tablets (75 mg total) by mouth daily. 135 tablet 3  . valACYclovir (VALTREX) 1000 MG tablet Take 2 tablets (2,000 mg total) by mouth 2 (two) times daily. For 1 day 20 tablet 2   No current facility-administered medications on file prior to visit.     Allergies  Allergen Reactions  . Buprenorphine Hcl Nausea And Vomiting  . Morphine And  Related Nausea And Vomiting  . 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 Sulfa Antibiotics Nausea And Vomiting    vomiting  . Sulfacetamide Sodium Nausea And Vomiting     Objective:  BP 124/80   Pulse 72   Temp 97.6 F (36.4 C) (Oral)   Resp 16   Ht 5' 8.5" (1.74 m)   Wt 188 lb (85.3 kg)   LMP 08/29/2017   SpO2 99%   BMI 28.17 kg/m   Physical Exam  Constitutional: She is oriented to person, place, and time. No distress.  HENT:  Right Ear: Tympanic membrane normal.  Left Ear: Tympanic membrane normal.  Nose: Mucosal edema present. No rhinorrhea. Right sinus exhibits no maxillary sinus tenderness and no frontal sinus tenderness. Left sinus exhibits no maxillary sinus tenderness and no frontal sinus tenderness.  Mouth/Throat: Oropharynx is clear and moist and mucous membranes are normal.  Cardiovascular: Normal rate, regular rhythm and normal heart sounds.  Pulmonary/Chest: Effort normal and breath sounds normal. No respiratory distress. She has no wheezes. She has no rales.  Neurological: She is alert and oriented to person, place, and time.  Skin: Skin is warm and dry.  Psychiatric: Judgment normal.  Vitals reviewed.  Results for orders placed or  performed in visit on 09/12/17  POCT rapid strep A  Result Value Ref Range   Rapid Strep A Screen Negative Negative  POCT CBC  Result Value Ref Range   WBC 5.9 4.6 - 10.2 K/uL   Lymph, poc 1.3 0.6 - 3.4   POC LYMPH PERCENT 22.5 10 - 50 %L   MID (cbc) 0.5 0 - 0.9   POC MID % 8.5 0 - 12 %M   POC Granulocyte 4.1 2 - 6.9   Granulocyte percent 69.0 37 - 80 %G   RBC 4.39 4.04 - 5.48 M/uL   Hemoglobin 13.5 12.2 - 16.2 g/dL   HCT, POC 39.5 37.7 - 47.9 %   MCV 90.1 80 - 97 fL   MCH, POC 30.8 27 - 31.2 pg   MCHC 34.1 31.8 - 35.4 g/dL   RDW, POC 12.3 %   Platelet Count, POC 266 142 - 424 K/uL   MPV 7.0 0 -  99.8 fL     Assessment and Plan :  1. Sore throat 2. Fatigue, unspecified type - POCT rapid strep A - Culture, Group A Strep - Epstein-Barr virus VCA antibody panel - POCT CBC - CMP14+EGFR - Pt presents for sore throat and fatigue x 4 weeks. WBC count is wnl. Negative rapid strep. Culture pending. Suspect EBV vs seasonal allergies. Plan to switch up her OCT allergy medications. Labs are pending. RTC if symptoms worsen.    Mercer Pod, PA-C  Primary Care at [place] 09/12/2017 9:56 AM

## 2017-09-13 LAB — CMP14+EGFR
ALT: 20 IU/L (ref 0–32)
AST: 22 IU/L (ref 0–40)
Albumin/Globulin Ratio: 2.1 (ref 1.2–2.2)
Albumin: 4.1 g/dL (ref 3.5–5.5)
Alkaline Phosphatase: 41 IU/L (ref 39–117)
BUN/Creatinine Ratio: 14 (ref 9–23)
BUN: 13 mg/dL (ref 6–24)
Bilirubin Total: 0.4 mg/dL (ref 0.0–1.2)
CO2: 23 mmol/L (ref 20–29)
Calcium: 9 mg/dL (ref 8.7–10.2)
Chloride: 105 mmol/L (ref 96–106)
Creatinine, Ser: 0.92 mg/dL (ref 0.57–1.00)
GFR calc Af Amer: 87 mL/min/{1.73_m2} (ref >59)
GFR calc non Af Amer: 75 mL/min/{1.73_m2} (ref >59)
Globulin, Total: 2 g/dL (ref 1.5–4.5)
Glucose: 73 mg/dL (ref 65–99)
Potassium: 4.2 mmol/L (ref 3.5–5.2)
Sodium: 141 mmol/L (ref 134–144)
Total Protein: 6.1 g/dL (ref 6.0–8.5)

## 2017-09-13 LAB — EPSTEIN-BARR VIRUS VCA ANTIBODY PANEL
EBV Early Antigen Ab, IgG: 14.6 U/mL — ABNORMAL HIGH (ref 0.0–8.9)
EBV NA IgG: 215 U/mL — ABNORMAL HIGH (ref 0.0–17.9)
EBV VCA IgG: >600 U/mL — ABNORMAL HIGH (ref 0.0–17.9)
EBV VCA IgM: <36 U/mL (ref 0.0–35.9)

## 2017-09-14 ENCOUNTER — Encounter: Payer: Self-pay | Admitting: Physician Assistant

## 2017-09-15 LAB — CULTURE, GROUP A STREP

## 2017-09-21 ENCOUNTER — Other Ambulatory Visit: Payer: Self-pay | Admitting: Physician Assistant

## 2017-09-21 DIAGNOSIS — J020 Streptococcal pharyngitis: Secondary | ICD-10-CM

## 2017-09-21 MED ORDER — AMOXICILLIN 500 MG PO CAPS: 500.0000 mg | ORAL_CAPSULE | Freq: Two times a day (BID) | ORAL | 0 refills | Status: AC

## 2017-09-21 NOTE — Progress Notes (Signed)
Pt is still symptomatic. Plan to treat with amoxicillin.

## 2017-09-28 ENCOUNTER — Ambulatory Visit: Payer: BC Managed Care – PPO | Admitting: Physician Assistant

## 2017-10-04 ENCOUNTER — Ambulatory Visit: Payer: BC Managed Care – PPO | Admitting: Physician Assistant

## 2017-10-04 ENCOUNTER — Encounter: Payer: Self-pay | Admitting: Physician Assistant

## 2017-10-04 VITALS — BP 125/80 | HR 63 | Temp 98.1°F | Resp 17 | Ht 69.0 in | Wt 189.0 lb

## 2017-10-04 DIAGNOSIS — R110 Nausea: Secondary | ICD-10-CM | POA: Diagnosis not present

## 2017-10-04 DIAGNOSIS — R51 Headache: Secondary | ICD-10-CM

## 2017-10-04 DIAGNOSIS — R519 Headache, unspecified: Secondary | ICD-10-CM

## 2017-10-04 MED ORDER — ONDANSETRON 8 MG PO TBDP: 8.0000 mg | ORAL_TABLET | Freq: Three times a day (TID) | ORAL | 0 refills | Status: DC | PRN

## 2017-10-04 MED ORDER — AZITHROMYCIN 250 MG PO TABS: ORAL_TABLET | ORAL | 0 refills | Status: DC

## 2017-10-04 NOTE — Progress Notes (Signed)
Debbie Shaw  MRN: [id number] DOB: [birth year]  PCP: Shawnee Knapp, MD  Subjective:  Pt is a 47 year old female who presents to clinic for sore throat f/u.   Rx for amoxicillin 500 mg bid x 10 days. She took this as directed.  She feels about 25% better.  Last week she was getting pains up the right side of her face. She was having a lot of HA's.  She has switched her allergy medication. She is not using flonase.   Review of Systems  Constitutional: Negative for chills, diaphoresis, fatigue and fever.  HENT: Positive for sore throat. Negative for congestion, postnasal drip, rhinorrhea, sinus pressure and sinus pain.   Eyes: Negative for photophobia and visual disturbance.  Respiratory: Negative for cough, shortness of breath and wheezing.   Allergic/Immunologic: Positive for environmental allergies.  Neurological: Positive for headaches. Negative for dizziness and numbness.  Psychiatric/Behavioral: Negative for sleep disturbance.    Patient Active Problem List   Diagnosis Date Noted  . 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 Anxiety 01/30/2013  . FIBROCYSTIC BREAST DISEASE 02/10/2009  . FATIGUE 02/10/2009    Current Outpatient Medications on File Prior to Visit  Medication Sig Dispense Refill  . ALPRAZolam (XANAX) 0.5 MG tablet Take 1 tablet (0.5 mg total) 2 (two) times daily as needed by mouth for anxiety. 30 tablet 2  . cetirizine (ZYRTEC) 10 MG tablet Take 10 mg by mouth daily.    . DiphenhydrAMINE HCl, Sleep, (ZZZQUIL PO) Take 1 tablet by mouth at bedtime.    Marland Kitchen ibuprofen (ADVIL,MOTRIN) 200 MG tablet Take 200 mg by mouth as needed.    . Multiple Vitamin (MULTIVITAMIN) tablet Take 1 tablet by mouth daily.    . sertraline (ZOLOFT) 50 MG tablet Take 1.5 tablets (75 mg total) by mouth daily. 135 tablet 3  . valACYclovir (VALTREX) 1000 MG tablet Take 2 tablets (2,000 mg total) by mouth 2 (two) times daily. For 1 day 20 tablet 2   No current facility-administered  medications on file prior to visit.     Allergies  Allergen Reactions  . Buprenorphine Hcl Nausea And Vomiting  . Morphine And Related Nausea And Vomiting  . 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 Sulfa Antibiotics Nausea And Vomiting    vomiting  . Sulfacetamide Sodium Nausea And Vomiting     Objective:  BP 125/80   Pulse 63   Temp 98.1 F (36.7 C) (Oral)   Resp 17   Ht 5\' 9"  (1.753 m)   Wt 189 lb (85.7 kg)   LMP 09/29/2017 (Approximate)   SpO2 98%   BMI 27.91 kg/m   Physical Exam  Constitutional: She is oriented to person, place, and time. No distress.  HENT:  Right Ear: Tympanic membrane normal.  Left Ear: Tympanic membrane normal.  Nose: Mucosal edema present. No rhinorrhea. Right sinus exhibits no maxillary sinus tenderness and no frontal sinus tenderness. Left sinus exhibits no maxillary sinus tenderness and no frontal sinus tenderness.  Mouth/Throat: Oropharynx is clear and moist and mucous membranes are normal.  Cardiovascular: Normal rate, regular rhythm and normal heart sounds.  Pulmonary/Chest: Effort normal and breath sounds normal. No respiratory distress. She has no wheezes. She has no rales.  Neurological: She is alert and oriented to person, place, and time.  Skin: Skin is warm and dry.  Psychiatric: Judgment  normal.  Vitals reviewed.   Assessment and Plan :  1. Nonintractable headache, unspecified chronicity pattern, unspecified headache type 2. Nausea without vomiting - suspect nausea and HA 2/2 uncontrolled pnd and seasonal allergies. Recently treated for strep, no relief. Advised otc antihistamines and flonase nasal spray. RTC in 3 months for annual and recheck symptoms.   Mercer Pod, PA-C  [facility] [address] 10/04/2017 5:32 PM

## 2017-10-04 NOTE — Patient Instructions (Addendum)
Continue taking your allergy pill daily.  Flonase: 2 sprays each nostril twice daily.  Let me know if you are not improving - we can try a quick round of prednisone.   Consider Acupuncture if you are not improving [facility] with Sharyn Lull or [place]).   Thank you for coming in today. I hope you feel we met your needs.  Feel free to call PCP if you have any questions or further requests.  Please consider signing up for MyChart if you do not already have it, as this is a great way to communicate with me.  Best,  Whitney McVey, PA-C  IF you received an x-ray today, you will receive an invoice from [facility]. Please contact [facility] at [phone] with questions or concerns regarding your invoice.   IF you received labwork today, you will receive an invoice from [facility]. Please contact [facility] at [phone] with questions or concerns regarding your invoice.   Our billing staff will not be able to assist you with questions regarding bills from these companies.  You will be contacted with the lab results as soon as they are available. The fastest way to get your results is to activate your My Chart account. Instructions are located on the last page of this paperwork. If you have not heard from [place] regarding the results in 2 weeks, please contact this office.

## 2017-11-09 ENCOUNTER — Other Ambulatory Visit: Payer: Self-pay | Admitting: Family Medicine

## 2017-11-09 DIAGNOSIS — F411 Generalized anxiety disorder: Secondary | ICD-10-CM

## 2017-11-10 NOTE — Telephone Encounter (Signed)
Rx request: Alprazolam 0.5 mg         Last filled: 04/11/17 2 RF  LOV: 10/04/17  PCP: [place]: verified

## 2017-11-18 NOTE — Telephone Encounter (Signed)
Has appt 12/22/17 so needs to keep that appt for any additional refills.

## 2017-12-22 ENCOUNTER — Encounter: Payer: Self-pay | Admitting: Family Medicine

## 2017-12-22 ENCOUNTER — Other Ambulatory Visit: Payer: Self-pay

## 2017-12-22 ENCOUNTER — Ambulatory Visit (INDEPENDENT_AMBULATORY_CARE_PROVIDER_SITE_OTHER): Payer: BC Managed Care – PPO | Admitting: Family Medicine

## 2017-12-22 VITALS — BP 100/78 | HR 81 | Temp 99.1°F | Resp 16 | Ht 68.75 in | Wt 182.8 lb

## 2017-12-22 DIAGNOSIS — Z136 Encounter for screening for cardiovascular disorders: Secondary | ICD-10-CM

## 2017-12-22 DIAGNOSIS — R51 Headache: Secondary | ICD-10-CM | POA: Diagnosis not present

## 2017-12-22 DIAGNOSIS — Z1329 Encounter for screening for other suspected endocrine disorder: Secondary | ICD-10-CM | POA: Diagnosis not present

## 2017-12-22 DIAGNOSIS — Z8619 Personal history of other infectious and parasitic diseases: Secondary | ICD-10-CM

## 2017-12-22 DIAGNOSIS — E7800 Pure hypercholesterolemia, unspecified: Secondary | ICD-10-CM | POA: Diagnosis not present

## 2017-12-22 DIAGNOSIS — Z1389 Encounter for screening for other disorder: Secondary | ICD-10-CM | POA: Diagnosis not present

## 2017-12-22 DIAGNOSIS — J3089 Other allergic rhinitis: Secondary | ICD-10-CM | POA: Diagnosis not present

## 2017-12-22 DIAGNOSIS — Z130 Encounter for screening for diseases of the blood and blood-forming organs and certain disorders involving the immune mechanism: Secondary | ICD-10-CM | POA: Diagnosis not present

## 2017-12-22 DIAGNOSIS — G44209 Tension-type headache, unspecified, not intractable: Secondary | ICD-10-CM | POA: Diagnosis not present

## 2017-12-22 DIAGNOSIS — F419 Anxiety disorder, unspecified: Secondary | ICD-10-CM

## 2017-12-22 DIAGNOSIS — Z1383 Encounter for screening for respiratory disorder NEC: Secondary | ICD-10-CM

## 2017-12-22 DIAGNOSIS — Z0000 Encounter for general adult medical examination without abnormal findings: Secondary | ICD-10-CM | POA: Diagnosis not present

## 2017-12-22 DIAGNOSIS — R519 Headache, unspecified: Secondary | ICD-10-CM

## 2017-12-22 LAB — POCT URINALYSIS DIP (MANUAL ENTRY)
BILIRUBIN UA: NEGATIVE mg/dL
Bilirubin, UA: NEGATIVE
Glucose, UA: NEGATIVE mg/dL
Leukocytes, UA: NEGATIVE
NITRITE UA: NEGATIVE
PH UA: 6 (ref 5.0–8.0)
Protein Ur, POC: NEGATIVE mg/dL
RBC UA: NEGATIVE
Spec Grav, UA: 1.02 (ref 1.010–1.025)
Urobilinogen, UA: 0.2 E.U./dL

## 2017-12-22 MED ORDER — SERTRALINE HCL 50 MG PO TABS: 75.0000 mg | ORAL_TABLET | Freq: Every day | ORAL | 3 refills | Status: DC

## 2017-12-22 MED ORDER — VALACYCLOVIR HCL 1 G PO TABS: 2000.0000 mg | ORAL_TABLET | Freq: Two times a day (BID) | ORAL | 2 refills | Status: DC

## 2017-12-22 MED ORDER — LEVOCETIRIZINE DIHYDROCHLORIDE 5 MG PO TABS: 5.0000 mg | ORAL_TABLET | Freq: Every evening | ORAL | 11 refills | Status: DC

## 2017-12-22 MED ORDER — MONTELUKAST SODIUM 10 MG PO TABS: 10.0000 mg | ORAL_TABLET | Freq: Every day | ORAL | 3 refills | Status: DC

## 2017-12-22 NOTE — Progress Notes (Signed)
Subjective:    Patient ID: Debbie Shaw, female    DOB: [birth year], 47 y.o.   MRN: [id number] Chief Complaint  Patient presents with  . Annual Exam  . Medication Refill    VALTREX    HPI Primary Preventative Screenings: Cervical Cancer: had abnormal pap smear in the past requiring colposcopy and biopsy, but since ~2005, she has had multiple normal paps since then. Last was 12/17/16 normal w/ neg HR HPV so repeat in 5 yrs Family Planning: surgical STI screening:declines need Breast Cancer: normal mammogram 12/13/16 Colorectal Cancer: Done 02/07/17 - repeat in 5 years. Maternal grandfather had history of colon cancer and died in his 25s. Patient's mother has had normal colonoscopies.  Tobacco use/EtOH/substances: Bone Density: Cardiac: EKG 09/21/2011 Weight/Blood sugar/Diet/Exercise: BMI Readings from Last 3 Encounters:  12/22/17 27.19 kg/m  10/04/17 27.91 kg/m  09/12/17 28.17 kg/m   No results found for: HGBA1C OTC/Vit/Supp/Herbal: Dentist/Optho: She doesn't wear corrective lenses, but has started wearing reader's glasses. Sees dental regularly Immunizations:  Immunization History  Administered Date(s) Administered  . Influenza Whole 03/14/2009  . Influenza, Seasonal, Injecte, Preservative Fre 01/30/2013  . Influenza,inj,Quad PF,6+ Mos 01/30/2015  . Influenza-Unspecified 02/12/2016, 02/01/2017  . Tdap 12/17/2016     Chronic Medical Conditions: Arthralgias: Insomnia: alt melatonin and diphenhydramine/zquil. Tried trazodone once and felt terrible. Anxiety: h/o w/ panic attackstrx'd w / alprazolam 2-3x/wk - mainly when she wakes overnight with panic and can't calm down ; zoloft 50 intermittently  - last yr was on 75mg  which was hleping - and has seen therapist for managing for her anxiety.  Sertraline at an ok dose at 50 but knows it is her family who is causing her stress.  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  Sometimse light will both her and has been more sound sensitive. At times to relief them she has had to go to bed.  HA is bitemporal and randiating up bilateral to the top of her head like a dull pressure. No aura.  She has not had her eyes checked in a long time. Does think HA is usu more in the afternoons - doesn't usu wake up with one. Does get a llot of seasonal allergies and congestion.  THere were a few times that the ibuprofen didn't help but usu that or tylenol does.  Sometimes it is a little harder for her eyes to focus. Drinks plenty of water.  Just coffee and EtoH  Allergies: claritin, diphenhydramine, and flonase. No asthma flairs  Past Medical History:  Diagnosis Date  . Abnormal Pap smear of cervix    Since 2005 with biopsy under colposcopy  . Allergy   . Anxiety   . Asthma    as child  . Depression   . Panic attacks    Past Surgical History:  Procedure Laterality Date  . CHOLECYSTECTOMY  age 52   crying, upset when waking up from anesthesia  . KNEE ARTHROSCOPY     bilateral, multiple times  . KNEE ARTHROSCOPY W/ ACL RECONSTRUCTION     left  . TONSILLECTOMY     Current Outpatient Medications on File Prior to Visit  Medication Sig Dispense Refill  . ALPRAZolam (XANAX) 0.5 MG tablet Take 1 tablet (0.5 mg total) by mouth 2 (two) times daily as needed for anxiety. **NEED OFFICE VISIT FOR REFILLS** 30 tablet 0  .  DiphenhydrAMINE HCl, Sleep, (ZZZQUIL PO) Take 1 tablet by mouth at bedtime.    Marland Kitchen ibuprofen (ADVIL,MOTRIN) 200 MG tablet Take 200 mg by mouth as needed.    . Multiple Vitamin (MULTIVITAMIN) tablet Take 1 tablet by mouth daily.    . valACYclovir (VALTREX) 1000 MG tablet Take 2 tablets (2,000 mg total) by mouth 2 (two) times daily. For 1 day 20 tablet 2  . cetirizine (ZYRTEC) 10 MG tablet Take 10 mg by mouth daily.    . ondansetron (ZOFRAN-ODT) 8 MG disintegrating tablet Take 1 tablet  (8 mg total) by mouth every 8 (eight) hours as needed for nausea. (Patient not taking: Reported on 12/22/2017) 12 tablet 0  . sertraline (ZOLOFT) 50 MG tablet Take 1.5 tablets (75 mg total) by mouth daily. 135 tablet 3   No current facility-administered medications on file prior to visit.    Allergies  Allergen Reactions  . Buprenorphine Hcl Nausea And Vomiting  . Morphine And Related Nausea And Vomiting  . 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 Sulfa Antibiotics Nausea And Vomiting    vomiting  . Sulfacetamide Sodium Nausea And Vomiting   Family History  Problem Relation Age of Onset  . Colon cancer Maternal Grandfather        unknown age  . Heart disease Maternal Grandmother   . Thyroid disease Mother   . Hyperlipidemia Mother   . Irritable bowel syndrome Maternal Uncle        x 3  . Irritable bowel syndrome Cousin   . Esophageal cancer Neg Hx   . Rectal cancer Neg Hx   . Stomach cancer Neg Hx    Social History   Socioeconomic History  . Marital status: Married    Spouse name: Not on file  . Number of children: 3  . Years of education: Not on file  . Highest education level: Not on file  Occupational History  . Occupation: Teacher, early years/pre: [employer]  . Financial resource strain: Not on file  . Food insecurity:    Worry: Not on file    Inability: Not on file  . Transportation needs:    Medical: Not on file    Non-medical: Not on file  Tobacco Use  . Smoking status: Never Smoker  . Smokeless tobacco: Never Used  Substance and Sexual Activity  . Alcohol use: Yes    Comment: 4 days per week per pt  . Drug use: No  . Sexual activity: Yes    Birth control/protection: Surgical  Lifestyle  . Physical activity:    Days per week: Not on file    Minutes per session: Not on file  . Stress: Not on file  Relationships  .  Social connections:    Talks on phone: Not on file    Gets together: Not on file    Attends religious service: Not on file    Active member of club or organization: Not on file    Attends meetings of clubs or organizations: Not on file    Relationship status: Not on file  Other Topics Concern  . Not on file  Social History Narrative   Lives with her husband and their daughter.  They share custody of his two daughters with their mother. Education: [employer].    Depression screen [facility] 2/9 10/04/2017 09/12/2017  02/21/2017 12/17/2016 08/07/2016  Decreased Interest 0 0 0 0 0  Down, Depressed, Hopeless 0 0 0 0 0  PHQ - 2 Score 0 0 0 0 0  Altered sleeping - - - - -  Tired, decreased energy - - - - -  Change in appetite - - - - -  Feeling bad or failure about yourself  - - - - -  Trouble concentrating - - - - -  Moving slowly or fidgety/restless - - - - -  Suicidal thoughts - - - - -  PHQ-9 Score - - - - -      Review of Systems  Constitutional: Positive for diaphoresis and unexpected weight change.  Respiratory: Positive for chest tightness.   Genitourinary: Positive for enuresis (?) and urgency (more).  Musculoskeletal: Positive for arthralgias.  Skin: Positive for wound (cysts?).  Allergic/Immunologic: Positive for environmental allergies.  Neurological: Positive for numbness (arm) and headaches (many more).  Psychiatric/Behavioral: Positive for agitation (grumpy) and sleep disturbance. The patient is nervous/anxious.   All other systems reviewed and are negative.  Negative unless otherwise noted here or in HPI    Objective:   Physical Exam  Constitutional: She is oriented to person, place, and time. She appears well-developed and well-nourished. No distress.  HENT:  Head: Normocephalic and atraumatic.  Right Ear: Tympanic membrane, external ear and ear canal normal.  Left Ear: Tympanic membrane, external ear and ear canal normal.  Nose: Nose normal. No mucosal edema or rhinorrhea.   Mouth/Throat: Uvula is midline, oropharynx is clear and moist and mucous membranes are normal. No posterior oropharyngeal erythema.  Eyes: Pupils are equal, round, and reactive to light. Conjunctivae and EOM are normal. Right eye exhibits no discharge. Left eye exhibits no discharge. No scleral icterus.  Neck: Normal range of motion. Neck supple. No thyromegaly present.  Cardiovascular: Normal rate, regular rhythm, normal heart sounds and intact distal pulses.  Pulmonary/Chest: Effort normal and breath sounds normal. No respiratory distress.  Abdominal: Soft. Bowel sounds are normal. There is no tenderness.  Musculoskeletal: She exhibits no edema.  Lymphadenopathy:    She has no cervical adenopathy.  Neurological: She is alert and oriented to person, place, and time. She has normal reflexes.  Skin: Skin is warm and dry. She is not diaphoretic. No erythema.  Psychiatric: She has a normal mood and affect. Her behavior is normal.      BP 100/78 (BP Location: Right Arm)   Pulse 81   Temp 99.1 F (37.3 C) (Oral)   Resp 16   Ht 5' 8.75" (1.746 m)   Wt 182 lb 12.8 oz (82.9 kg)   LMP 12/12/2017   SpO2 97%   BMI 27.19 kg/m    Visual Acuity Screening   Right eye Left eye Both eyes  Without correction: 20/20 20/30 20/20   With correction:       Assessment & Plan:   1. Annual physical exam   2. Screening for cardiovascular, respiratory, and genitourinary diseases   3. Screening for deficiency anemia   4. Acute non intractable tension-type headache -get vision check at opto.  Could be sinus cong/seasonal allergies so becomes more aggressive w/ trx.  Watch out of med overuse causing analgesia rebout HAs. If HA doesn't resolve after vision correction and sinus/seasonal allergy trx in sev wks, RTC for further eval.  5. New onset headache   6. Pure hypercholesterolemia   7. Screening for thyroid disorder   8. Anxiety - refilled sertraline 50 and alprazolam  9. H/O cold sores - refilled  valtrex  10.    Environmental and seasonal allergies - d/c loratidine and start xyzal and singulair, cont using nasal steroid spray and start nasal saline freq 11.    Sinus HA -   Orders Placed This Encounter  Procedures  . Comprehensive metabolic panel    Order Specific Question:   Has the patient fasted?    Answer:   Yes  . CBC with Differential/Platelet  . TSH  . Lipid panel    Order Specific Question:   Has the patient fasted?    Answer:   Yes  . POCT urinalysis dipstick  consider EKG next yr and h/o prolonged QT on EKG done 2013  Meds ordered this encounter  Medications  . montelukast (SINGULAIR) 10 MG tablet    Sig: Take 1 tablet (10 mg total) by mouth at bedtime.    Dispense:  30 tablet    Refill:  3  . levocetirizine (XYZAL) 5 MG tablet    Sig: Take 1 tablet (5 mg total) by mouth every evening.    Dispense:  30 tablet    Refill:  11  . sertraline (ZOLOFT) 50 MG tablet    Sig: Take 1.5 tablets (75 mg total) by mouth daily.    Dispense:  135 tablet    Refill:  3  . valACYclovir (VALTREX) 1000 MG tablet    Sig: Take 2 tablets (2,000 mg total) by mouth 2 (two) times daily. For 1 day    Dispense:  20 tablet    Refill:  2    Pt does not need currently. Want to keep current rx active on file.    I personally performed the services described in this documentation, which was scribed in my presence. The recorded information has been reviewed and considered, and addended by me as needed.   Delman Cheadle, M.D.  [facility] [address] [place], [place] [address] [phone] phone [phone] fax  12/22/17 8:22 AM

## 2017-12-22 NOTE — Patient Instructions (Addendum)
Stop the claritin, try xyzal and singluair instead. Keep on using a nasal steroid spray like nasacort, nasonex, rhinocort, or flonase. Use a nasal saline spray as frequently as you can.   Make an appointment with [facility] to get your vision check.   If your headaches still persist after these, RTC. Do not take otc pain meds for HA > 3d/ wk and alternatie between ibuprofen/aleve type products w/ acetaminophen or excedrin migraine. Don't use the same thing more than half of the days of the week so you don't get an medication rebound headache pattern.  IF you received an x-ray today, you will receive an invoice from [facility]. Please contact [facility] at [phone] with questions or concerns regarding your invoice.   IF you received labwork today, you will receive an invoice from [place]. Please contact [facility] at [phone] with questions or concerns regarding your invoice.   Our billing staff will not be able to assist you with questions regarding bills from these companies.  You will be contacted with the lab results as soon as they are available. The fastest way to get your results is to activate your My Chart account. Instructions are located on the last page of this paperwork. If you have not heard from [place] regarding the results in 2 weeks, please contact this office.    Analgesic Rebound Headache An analgesic rebound headache, sometimes called a medication overuse headache, is a headache that comes after pain medicine (analgesic) taken to treat the original (primary) headache has worn off. Any type of primary headache can return as a rebound headache if a person regularly takes analgesics more than three times a week to treat it. The types of primary headaches that are commonly associated with rebound headaches include:  Migraines.  Headaches that arise from tense muscles in the head and neck area (tension headaches).  Headaches that develop and happen again  (recur) on one side of the head and around the eye (cluster headaches).  If rebound headaches continue, they become chronic daily headaches. What are the causes? This condition may be caused by frequent use of:  Over-the-counter medicines such as aspirin, ibuprofen, and acetaminophen.  Sinus relief medicines and other medicines that contain caffeine.  Narcotic pain medicines such as codeine and oxycodone.  What are the signs or symptoms? The symptoms of a rebound headache are the same as the symptoms of the original headache. Some of the symptoms of specific types of headaches include: Migraine headache  Pulsing or throbbing pain on one or both sides of the head.  Severe pain that interferes with daily activities.  Pain that is worsened by physical activity.  Nausea, vomiting, or both.  Pain with exposure to bright light, loud noises, or strong smells.  General sensitivity to bright light, loud noises, or strong smells.  Visual changes.  Numbness of one or both arms. Tension headache  Pressure around the head.  Dull, aching head pain.  Pain felt over the front and sides of the head.  Tenderness in the muscles of the head, neck, and shoulders. Cluster headache  Severe pain that begins in or around one eye or temple.  Redness and tearing in the eye on the same side as the pain.  Droopy or swollen eyelid.  One-sided head pain.  Nausea.  Runny nose.  Sweaty, pale facial skin.  Restlessness. How is this diagnosed? This condition is diagnosed by:  Reviewing your medical history. This includes the nature of your primary headaches.  Reviewing  the types of pain medicines that you have been using to treat your headaches and how often you take them.  How is this treated? This condition may be treated or managed by:  Discontinuing frequent use of the analgesic medicine. Doing this may worsen your headaches at first, but the pain should eventually become more  manageable, less frequent, and less severe.  Seeing a headache specialist. He or she may be able to help you manage your headaches and help make sure there is not another cause of the headaches.  Using methods of stress relief, such as acupuncture, counseling, biofeedback, and massage. Talk with your health care provider about which methods might be good for you.  Follow these instructions at home:  Take over-the-counter and prescription medicines only as told by your health care provider.  Stop the repeated use of pain medicine as told by your health care provider. Stopping can be difficult. Carefully follow instructions from your health care provider.  Avoid triggers that are known to cause your primary headaches.  Keep all follow-up visits as told by your health care provider. This is important. Contact a health care provider if:  You continue to experience headaches after following treatments that your health care provider recommended. Get help right away if:  You develop new headache pain.  You develop headache pain that is different than what you have experienced in the past.  You develop numbness or tingling in your arms or legs.  You develop changes in your speech or vision. This information is not intended to replace advice given to you by your health care provider. Make sure you discuss any questions you have with your health care provider. Document Released: 07/31/2003 Document Revised: 11/28/2015 Document Reviewed: 10/13/2015 Elsevier Interactive Patient Education  2018 [place] [employer]  Get These Tests 1. Blood Pressure- Have your blood pressure checked once a year by your health care provider.  Normal blood pressure is 120/80. 2. Weight- Have your body mass index (BMI) calculated to screen for obesity.  BMI is measure of body fat based on height and weight.  You can also calculate your own BMI at GravelBags.it. 3. Cholesterol- Have your  cholesterol checked every 5 years starting at age 29 then yearly starting at age 13. 59. Chlamydia, HIV, and other sexually transmitted diseases- Get screened every year until age 53, then within three months of each new sexual provider. 5. Pap Test - Every 1-5 years; discuss with your health care provider. 6. Mammogram- Every 1-2 years starting at age 32--50  Take these medicines  Calcium with Vitamin D-Your body needs 1200 mg of Calcium each day and [phone] IU of Vitamin D daily.  Your body can only absorb 500 mg of Calcium at a time so Calcium must be taken in 2 or 3 divided doses throughout the day.  Multivitamin with folic acid- Once daily if it is possible for you to become pregnant.  Get these Immunizations  Gardasil-Series of three doses; prevents HPV related illness such as genital warts and cervical cancer.  Menactra-Single dose; prevents meningitis.  Tetanus shot- Every 10 years.  Flu shot-Every year.  Take these steps 1. Do not smoke-Your healthcare provider can help you quit.  For tips on how to quit go to www.smokefree.gov or call 1-800 QUITNOW. 2. Be physically active- Exercise 5 days a week for at least 30 minutes.  If you are not already physically active, start slow and gradually work up to 30 minutes of moderate  physical activity.  Examples of moderate activity include walking briskly, dancing, swimming, bicycling, etc. 3. Breast Cancer- A self breast exam every month is important for early detection of breast cancer.  For more information and instruction on self breast exams, ask your healthcare provider or https://www.patel.info/. 4. Eat a healthy diet- Eat a variety of healthy foods such as fruits, vegetables, whole grains, low fat milk, low fat cheeses, yogurt, lean meats, poultry and fish, beans, nuts, tofu, etc.  For more information go to www. Thenutritionsource.org 5. Drink alcohol in moderation- Limit alcohol intake to one drink or less per  day. Never drink and drive. 6. Depression- Your emotional health is as important as your physical health.  If you're feeling down or losing interest in things you normally enjoy please talk to your healthcare provider about being screened for depression. 7. Dental visit- Brush and floss your teeth twice daily; visit your dentist twice a year. 8. Eye doctor- Get an eye exam at least every 2 years. 9. Helmet use- Always wear a helmet when riding a bicycle, motorcycle, rollerblading or skateboarding. 48. Safe sex- If you may be exposed to sexually transmitted infections, use a condom. 11. Seat belts- Seat belts can save your live; always wear one. 12. Smoke/Carbon Monoxide detectors- These detectors need to be installed on the appropriate level of your home. Replace batteries at least once a year. 13. Skin cancer- When out in the sun please cover up and use sunscreen 15 SPF or higher. 14. Violence- If anyone is threatening or hurting you, please tell your healthcare provider.         Sinus Headache A sinus headache occurs when the paranasal sinuses become clogged or swollen. Paranasal sinuses are air pockets within the bones of the face. Sinus headaches can range from mild to severe. What are the causes? A sinus headache can result from various conditions that affect the sinuses, such as:  Colds.  Sinus infections.  Allergies.  What are the signs or symptoms? The main symptom of this condition is a headache that may feel like pain or pressure in the face, forehead, ears, or upper teeth. People who have a sinus headache often have other symptoms, such as:  Congested or runny nose.  Fever.  Inability to smell.  Weather changes can make symptoms worse. How is this diagnosed? This condition may be diagnosed based on:  A physical exam and medical history.  Imaging tests, such as a CT scan and MRI, to check for problems with the sinuses.  A specialist may look into the sinuses  with a tool that has a camera (endoscopy).  How is this treated? Treatment for this condition depends on the cause.  Sinus pain that is caused by a sinus infection may be treated with antibiotic medicine.  Sinus pain that is caused by allergies may be helped by allergy medicines (antihistamines) and medicated nasal sprays.  Sinus pain that is caused by congestion may be helped by flushing the nose and sinuses with saline solution.  Follow these instructions at home:  Take medicines only as directed by your health care provider.  If you were prescribed an antibiotic medicine, finish all of it even if you start to feel better.  If you have congestion, use a nasal spray to help reduce pressure.  If directed, apply a warm, moist washcloth to your face to help relieve pain. Contact a health care provider if:  You have headaches more than one time each week.  You have  sensitivity to light or sound.  You have a fever.  You feel sick to your stomach (nauseous) or you throw up (vomit).  Your headaches do not get better with treatment. Many people think that they have a sinus headache when they actually have migraines or tension headaches. Get help right away if:  You have vision problems.  You have sudden, severe pain in your face or head.  You have a seizure.  You are confused.  You have a stiff neck. This information is not intended to replace advice given to you by your health care provider. Make sure you discuss any questions you have with your health care provider. Document Released: 06/17/2004 Document Revised: 01/04/2016 Document Reviewed: 05/06/2014 Elsevier Interactive Patient Education  2018 [place].  Tension Headache A tension headache is a feeling of pain, pressure, or aching that is often felt over the front and sides of the head. The pain can be dull, or it can feel tight (constricting). Tension headaches are not normally associated with nausea or vomiting,  and they do not get worse with physical activity. Tension headaches can last from 30 minutes to several days. This is the most common type of headache. CAUSES The exact cause of this condition is not known. Tension headaches often begin after stress, anxiety, or depression. Other triggers may include:  Alcohol.  Too much caffeine, or caffeine withdrawal.  Respiratory infections, such as colds, flu, or sinus infections.  Dental problems or teeth clenching.  Fatigue.  Holding your head and neck in the same position for a long period of time, such as while using a computer.  Smoking. SYMPTOMS Symptoms of this condition include:  A feeling of pressure around the head.  Dull, aching head pain.  Pain felt over the front and sides of the head.  Tenderness in the muscles of the head, neck, and shoulders. DIAGNOSIS This condition may be diagnosed based on your symptoms and a physical exam. Tests may be done, such as a CT scan or an MRI of your head. These tests may be done if your symptoms are severe or unusual. TREATMENT This condition may be treated with lifestyle changes and medicines to help relieve symptoms. HOME CARE INSTRUCTIONS Managing Pain  Take over-the-counter and prescription medicines only as told by your health care provider.  Lie down in a dark, quiet room when you have a headache.  If directed, apply ice to the head and neck area: ? Put ice in a plastic bag. ? Place a towel between your skin and the bag. ? Leave the ice on for 20 minutes, 2-3 times per day.  Use a heating pad or a hot shower to apply heat to the head and neck area as told by your health care provider. Eating and Drinking  Eat meals on a regular schedule.  Limit alcohol use.  Decrease your caffeine intake, or stop using caffeine. General Instructions  Keep all follow-up visits as told by your health care provider. This is important.  Keep a headache journal to help find out what may  trigger your headaches. For example, write down: ? What you eat and drink. ? How much sleep you get. ? Any change to your diet or medicines.  Try massage or other relaxation techniques.  Limit stress.  Sit up straight, and avoid tensing your muscles.  Do not use tobacco products, including cigarettes, chewing tobacco, or e-cigarettes. If you need help quitting, ask your health care provider.  Exercise regularly as told by  your health care provider.  Get 7-9 hours of sleep, or the amount recommended by your health care provider. SEEK MEDICAL CARE IF:  Your symptoms are not helped by medicine.  You have a headache that is different from what you normally experience.  You have nausea or you vomit.  You have a fever. SEEK IMMEDIATE MEDICAL CARE IF:  Your headache becomes severe.  You have repeated vomiting.  You have a stiff neck.  You have a loss of vision.  You have problems with speech.  You have pain in your eye or ear.  You have muscular weakness or loss of muscle control.  You lose your balance or you have trouble walking.  You feel faint or you pass out.  You have confusion. This information is not intended to replace advice given to you by your health care provider. Make sure you discuss any questions you have with your health care provider. Document Released: 05/10/2005 Document Revised: 01/29/2015 Document Reviewed: 09/02/2014 Elsevier Interactive Patient Education  2017 [employer].

## 2017-12-23 LAB — CBC WITH DIFFERENTIAL/PLATELET
Basophils Absolute: 0.1 10*3/uL (ref 0.0–0.2)
Basos: 1 %
EOS (ABSOLUTE): 0.8 10*3/uL — AB (ref 0.0–0.4)
Eos: 11 %
Hematocrit: 41 % (ref 34.0–46.6)
Hemoglobin: 13.7 g/dL (ref 11.1–15.9)
IMMATURE GRANULOCYTES: 0 %
Immature Grans (Abs): 0 10*3/uL (ref 0.0–0.1)
Lymphocytes Absolute: 1.4 10*3/uL (ref 0.7–3.1)
Lymphs: 20 %
MCH: 30.7 pg (ref 26.6–33.0)
MCHC: 33.4 g/dL (ref 31.5–35.7)
MCV: 92 fL (ref 79–97)
Monocytes Absolute: 0.5 10*3/uL (ref 0.1–0.9)
Monocytes: 8 %
NEUTROS PCT: 60 %
Neutrophils Absolute: 4.2 10*3/uL (ref 1.4–7.0)
PLATELETS: 257 10*3/uL (ref 150–450)
RBC: 4.46 x10E6/uL (ref 3.77–5.28)
RDW: 12.3 % (ref 12.3–15.4)
WBC: 7 10*3/uL (ref 3.4–10.8)

## 2017-12-23 LAB — COMPREHENSIVE METABOLIC PANEL
A/G RATIO: 1.9 (ref 1.2–2.2)
ALK PHOS: 41 IU/L (ref 39–117)
ALT: 12 IU/L (ref 0–32)
AST: 18 IU/L (ref 0–40)
Albumin: 4.2 g/dL (ref 3.5–5.5)
BUN/Creatinine Ratio: 12 (ref 9–23)
BUN: 13 mg/dL (ref 6–24)
Bilirubin Total: 0.6 mg/dL (ref 0.0–1.2)
CHLORIDE: 102 mmol/L (ref 96–106)
CO2: 23 mmol/L (ref 20–29)
Calcium: 9.2 mg/dL (ref 8.7–10.2)
Creatinine, Ser: 1.07 mg/dL — ABNORMAL HIGH (ref 0.57–1.00)
GFR calc Af Amer: 72 mL/min/{1.73_m2} (ref >59)
GFR calc non Af Amer: 63 mL/min/{1.73_m2} (ref >59)
GLOBULIN, TOTAL: 2.2 g/dL (ref 1.5–4.5)
Glucose: 95 mg/dL (ref 65–99)
POTASSIUM: 4.1 mmol/L (ref 3.5–5.2)
SODIUM: 138 mmol/L (ref 134–144)
Total Protein: 6.4 g/dL (ref 6.0–8.5)

## 2017-12-23 LAB — LIPID PANEL
Chol/HDL Ratio: 4.8 ratio — ABNORMAL HIGH (ref 0.0–4.4)
Cholesterol, Total: 245 mg/dL — ABNORMAL HIGH (ref 100–199)
HDL: 51 mg/dL (ref >39)
LDL CALC: 174 mg/dL — AB (ref 0–99)
Triglycerides: 99 mg/dL (ref 0–149)
VLDL CHOLESTEROL CAL: 20 mg/dL (ref 5–40)

## 2017-12-23 LAB — TSH: TSH: 1.44 u[IU]/mL (ref 0.450–4.500)

## 2018-02-27 ENCOUNTER — Encounter: Payer: Self-pay | Admitting: Family Medicine

## 2018-02-27 DIAGNOSIS — R519 Headache, unspecified: Secondary | ICD-10-CM | POA: Insufficient documentation

## 2018-02-27 DIAGNOSIS — E7800 Pure hypercholesterolemia, unspecified: Secondary | ICD-10-CM

## 2018-02-27 DIAGNOSIS — R51 Headache: Secondary | ICD-10-CM

## 2018-02-27 DIAGNOSIS — Z8619 Personal history of other infectious and parasitic diseases: Secondary | ICD-10-CM | POA: Insufficient documentation

## 2018-02-27 DIAGNOSIS — J3089 Other allergic rhinitis: Secondary | ICD-10-CM | POA: Insufficient documentation

## 2018-02-27 HISTORY — DX: Pure hypercholesterolemia, unspecified: E78.00

## 2018-03-07 ENCOUNTER — Encounter: Payer: Self-pay | Admitting: Physician Assistant

## 2018-03-07 ENCOUNTER — Ambulatory Visit: Payer: BC Managed Care – PPO | Admitting: Physician Assistant

## 2018-03-07 ENCOUNTER — Ambulatory Visit (INDEPENDENT_AMBULATORY_CARE_PROVIDER_SITE_OTHER): Payer: BC Managed Care – PPO

## 2018-03-07 ENCOUNTER — Other Ambulatory Visit: Payer: Self-pay

## 2018-03-07 VITALS — BP 113/78 | HR 79 | Temp 98.0°F | Resp 16 | Ht 68.0 in | Wt 185.0 lb

## 2018-03-07 DIAGNOSIS — M5441 Lumbago with sciatica, right side: Secondary | ICD-10-CM

## 2018-03-07 DIAGNOSIS — K5900 Constipation, unspecified: Secondary | ICD-10-CM | POA: Diagnosis not present

## 2018-03-07 LAB — POC MICROSCOPIC URINALYSIS (UMFC): Mucus: ABSENT

## 2018-03-07 LAB — POCT URINALYSIS DIP (MANUAL ENTRY)
Bilirubin, UA: NEGATIVE
Blood, UA: NEGATIVE
Glucose, UA: NEGATIVE mg/dL
Ketones, POC UA: NEGATIVE mg/dL
Leukocytes, UA: NEGATIVE
Nitrite, UA: NEGATIVE
Protein Ur, POC: NEGATIVE mg/dL
Spec Grav, UA: 1.015 (ref 1.010–1.025)
Urobilinogen, UA: 0.2 E.U./dL
pH, UA: 7 (ref 5.0–8.0)

## 2018-03-07 MED ORDER — POLYETHYLENE GLYCOL 3350 17 GM/SCOOP PO POWD: 17.0000 g | Freq: Two times a day (BID) | ORAL | 1 refills | Status: AC | PRN

## 2018-03-07 NOTE — Progress Notes (Signed)
Debbie Shaw  MRN: [id number] DOB: 1970/07/21  PCP: Shawnee Knapp, MD  Subjective:  Pt is a 47 year old female who presents to clinic for pain of her right side x 2 days.  Pain woke her from sleep last night and she endorses "dull ache" this morning.  Does not radiate. Pain was 7/10 last night.  Last night pain woke her up from sleep, lasted about one hour.  No pain currently.   "I've been a little constipated" last week. miralax helped a little bit.  LMP 03/02/2018. Periods are regular.  Denies fever, chills, urinary symptoms, vaginal symptoms, vomiting, cough, shob   Gall bladder removed about 20 years ago.   Review of Systems  Constitutional: Negative for chills and fever.  Gastrointestinal: Positive for constipation. Negative for abdominal pain, diarrhea, nausea and vomiting.  Musculoskeletal: Positive for back pain.    Patient Active Problem List   Diagnosis Date Noted  . Environmental and seasonal allergies 02/27/2018  . H/O cold sores 02/27/2018  . Pure hypercholesterolemia 02/27/2018  . New onset headache 02/27/2018  . 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 Anxiety 01/30/2013  . FIBROCYSTIC BREAST DISEASE 02/10/2009  . FATIGUE 02/10/2009    Current Outpatient Medications on File Prior to Visit  Medication Sig Dispense Refill  . ALPRAZolam (XANAX) 0.5 MG tablet Take 1 tablet (0.5 mg total) by mouth 2 (two) times daily as needed for anxiety. **NEED OFFICE VISIT FOR REFILLS** 30 tablet 0  . Calcium-Magnesium-Vitamin D (CALCIUM MAGNESIUM PO) Take by mouth daily.    . Cholecalciferol (VITAMIN D PO) Take by mouth daily.    . DiphenhydrAMINE HCl, Sleep, (ZZZQUIL PO) Take 1 tablet by mouth at bedtime.    Marland Kitchen ibuprofen (ADVIL,MOTRIN) 200 MG tablet Take 200 mg by mouth as needed.    Marland Kitchen levocetirizine (XYZAL) 5 MG tablet Take 1 tablet (5 mg total) by mouth every evening. 30 tablet 11  . montelukast (SINGULAIR) 10 MG tablet Take 1 tablet (10 mg total) by mouth at  bedtime. 30 tablet 3  . Multiple Vitamin (MULTIVITAMIN) tablet Take 1 tablet by mouth daily.    . sertraline (ZOLOFT) 50 MG tablet Take 1.5 tablets (75 mg total) by mouth daily. 135 tablet 3  . valACYclovir (VALTREX) 1000 MG tablet Take 2 tablets (2,000 mg total) by mouth 2 (two) times daily. For 1 day 20 tablet 2  . Probiotic Product (PROBIOTIC DAILY PO) Take by mouth.     No current facility-administered medications on file prior to visit.     Allergies  Allergen Reactions  . Buprenorphine Hcl Nausea And Vomiting  . Morphine And Related Nausea And Vomiting  . 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 Sulfa Antibiotics Nausea And Vomiting    vomiting  . Sulfacetamide Sodium Nausea And Vomiting     Objective:  BP 113/78 (BP Location: Right Arm, Patient Position: Sitting, Cuff Size: Normal)   Pulse 79   Temp 98 F (36.7 C) (Oral)   Resp 16   Ht 5\' 8"  (1.727 m)   Wt 185 lb (83.9 kg)   LMP 03/02/2018   SpO2 97%   BMI 28.13 kg/m   Physical Exam  Constitutional: She appears well-developed and well-nourished.  Cardiovascular: Normal rate and regular rhythm.  Abdominal: Soft. Normal appearance. There is no hepatosplenomegaly. There is no tenderness. There is no rigidity,  no guarding, no CVA tenderness, no tenderness at McBurney's point and negative Murphy's sign. No hernia.  Skin: Skin is warm and dry.  Psychiatric: She has a normal mood and affect. Her behavior is normal. Thought content normal.  Vitals reviewed.  Dg Abd 2 Views  Result Date: 03/07/2018 CLINICAL DATA:  Right upper quadrant abdominal pain.  Constipation. EXAM: ABDOMEN - 2 VIEW COMPARISON:  None. FINDINGS: The bowel gas pattern is normal. There is no evidence of free air. Status post cholecystectomy. Phleboliths are noted in the pelvis. Moderate stool burden is noted. IMPRESSION: Moderate  stool burden.  No evidence of bowel obstruction or ileus. Electronically Signed   By: Marijo Conception, M.D.   On: 03/07/2018 15:44   Results for orders placed or performed in visit on 03/07/18  POCT urinalysis dipstick  Result Value Ref Range   Color, UA yellow yellow   Clarity, UA clear clear   Glucose, UA negative negative mg/dL   Bilirubin, UA negative negative   Ketones, POC UA negative negative mg/dL   Spec Grav, UA 1.015 1.010 - 1.025   Blood, UA negative negative   pH, UA 7.0 5.0 - 8.0   Protein Ur, POC negative negative mg/dL   Urobilinogen, UA 0.2 0.2 or 1.0 E.U./dL   Nitrite, UA Negative Negative   Leukocytes, UA Negative Negative  POCT Microscopic Urinalysis ([facility])  Result Value Ref Range   WBC,UR,HPF,POC None None WBC/hpf   RBC,UR,HPF,POC None None RBC/hpf   Bacteria None None, Too numerous to count   Mucus Absent Absent   Epithelial Cells, UR Per Microscopy None None, Too numerous to count cells/hpf    Assessment and Plan :  1. Constipation, unspecified constipation type - pt presents c/o right side pain x 2 days. She does not have a gall bladder removal. Vitals are stable. NAD. No red flags on PE. UA is negative. X-ray shows moderate stool burder consistent with her pain pattern. Plan to treat for constipation with miralax. If no improvement in 1-2 weeks RTC and consider further imaging.  - polyethylene glycol powder (GLYCOLAX/MIRALAX) powder; Take 17 g by mouth 2 (two) times daily as needed.  Dispense: 578 g; Refill: 1  2. Right-sided low back pain with sciatica, sciatica laterality unspecified, unspecified chronicity - POCT urinalysis dipstick - POCT Microscopic Urinalysis ([facility]) - DG Abd 2 Views; Future    Mercer Pod, PA-C  [facility] [place] 03/07/2018 3:07 PM  Please note: Portions of this report may have been transcribed using dragon voice recognition software. Every effort was made to ensure accuracy; however,  inadvertent computerized transcription errors may be present.

## 2018-03-07 NOTE — Patient Instructions (Addendum)
Your abdominal x-ray shows a moderate amount of stool, especially in the right upper quadrant. Let's treat you today for constipation for the next few weeks.   For constipation  Look up "6 Reasons to Care about [place]" at [place] (WindowBlog.ch)    If your stools are hard or are formed balls or you have to strain a stool softener will help - use colace 2-3 capsule a day  Otherwise, For gentle treatment of constipation use Miralax 1-2 capfuls a day until your stools are soft and regular and then decrease the usage - you can use this daily  1) Water: Make sure you are drinking enough water daily - about 1-3 liters. 2) Fiber: Make sure you are getting enough fiber in your diet - this will make you regular - you can eat high fiber foods or use metamucil as a supplement - it is really important to drink enough water when using fiber supplements. Foods that have a lot of fiber include vegetables, fruits, beans, nuts, oatmeal, and some breads and cereals. You can tell how much fiber is in a food by reading the nutrition label. Doctors recommend eating 25 to 36 grams of fiber each day. 3) Fitness: Increasing your physical activity will help increase the natural movement of your bowels. Try to get 20-30 minutes of exercise daily.  4) Use a 9" stool in front of your toilet to rest your feet on while you have a bowel movement. This will loosen your rectal muscles to help your stool come out easier and prevent straining.   ?Refrain from straining or lingering (eg, reading) on the toilet.  ?If possible, patients should avoid medications that can cause constipation or diarrhea. ?Limit intake of fatty foods and alcohol, which can exacerbate constipation.

## 2018-04-08 ENCOUNTER — Other Ambulatory Visit: Payer: Self-pay | Admitting: Family Medicine

## 2018-04-08 DIAGNOSIS — F411 Generalized anxiety disorder: Secondary | ICD-10-CM

## 2018-04-10 NOTE — Telephone Encounter (Signed)
Requested medication (s) are due for refill today: Yes  Requested medication (s) are on the active medication list: Yes  Last refill:  11/18/17  Future visit scheduled: No  Notes to clinic:  Unable to refill per protocol, narcotic.     Requested Prescriptions  Pending Prescriptions Disp Refills   ALPRAZolam (XANAX) 0.5 MG tablet [Pharmacy Med Name: ALPRAZOLAM 0.5 MG TABLET] 30 tablet 0    Sig: TAKE 1 TABLET BY MOUTH 2 TIMES DAILY AS NEEDED FOR ANXIETY. **NEED OFFICE VISIT FOR REFILLS**     Not Delegated - Psychiatry:  Anxiolytics/Hypnotics Failed - 04/08/2018  3:28 PM      Failed - This refill cannot be delegated      Failed - Urine Drug Screen completed in last 360 days.      Passed - Valid encounter within last 6 months    Recent Outpatient Visits          1 month ago Constipation, unspecified constipation type   [facility] at [facility], Gelene Mink, PA-C   3 months ago Annual physical exam   [facility], Laurey Arrow, MD   6 months ago Nonintractable headache, unspecified chronicity pattern, unspecified headache type   [facility] at [facility], Gelene Mink, PA-C   7 months ago Sore throat   [facility] at [facility], Gelene Mink, PA-C   1 year ago Acute bronchitis, unspecified organism   [facility], Laurey Arrow, MD

## 2018-04-11 ENCOUNTER — Other Ambulatory Visit: Payer: Self-pay | Admitting: Family Medicine

## 2018-04-26 ENCOUNTER — Telehealth: Payer: Self-pay | Admitting: Physician Assistant

## 2018-04-26 NOTE — Telephone Encounter (Signed)
Please advise 

## 2018-04-26 NOTE — Telephone Encounter (Signed)
Please review and adv    Copied from [place] [id number]. Topic: Referral - Request for Referral >> Apr 26, 2018  9:46 AM Parke Poisson wrote: Has patient seen PCP for this complaint? yes *If NO, is insurance requiring patient see PCP for this issue before PCP can refer them? Referral for which specialty: Mental Health Preferred provider/office: No preferance Reason for referral: Anxiety

## 2018-04-26 NOTE — Telephone Encounter (Signed)
Psych does not need referrals. She can call any of the following places:  [facility] ([place], [place], [place]) - [phone] [place] [address], [place]) - [place] - [phone] [place], [phone]

## 2018-05-01 ENCOUNTER — Encounter: Payer: Self-pay | Admitting: *Deleted

## 2018-05-01 NOTE — Telephone Encounter (Signed)
Sent mychart message

## 2018-05-18 ENCOUNTER — Other Ambulatory Visit: Payer: Self-pay

## 2018-05-18 ENCOUNTER — Ambulatory Visit: Payer: BC Managed Care – PPO | Admitting: Family Medicine

## 2018-05-18 ENCOUNTER — Encounter: Payer: Self-pay | Admitting: Family Medicine

## 2018-05-18 VITALS — BP 113/73 | HR 74 | Resp 16 | Ht 68.0 in | Wt 189.0 lb

## 2018-05-18 DIAGNOSIS — M940 Chondrocostal junction syndrome [Tietze]: Secondary | ICD-10-CM

## 2018-05-18 DIAGNOSIS — R0609 Other forms of dyspnea: Secondary | ICD-10-CM | POA: Diagnosis not present

## 2018-05-18 DIAGNOSIS — R071 Chest pain on breathing: Secondary | ICD-10-CM

## 2018-05-18 DIAGNOSIS — R61 Generalized hyperhidrosis: Secondary | ICD-10-CM

## 2018-05-18 DIAGNOSIS — E7800 Pure hypercholesterolemia, unspecified: Secondary | ICD-10-CM

## 2018-05-18 DIAGNOSIS — F411 Generalized anxiety disorder: Secondary | ICD-10-CM

## 2018-05-18 MED ORDER — SERTRALINE HCL 100 MG PO TABS: 100.0000 mg | ORAL_TABLET | Freq: Every day | ORAL | 3 refills | Status: DC

## 2018-05-18 MED ORDER — CYCLOBENZAPRINE HCL 10 MG PO TABS: 10.0000 mg | ORAL_TABLET | Freq: Every day | ORAL | 0 refills | Status: DC

## 2018-05-18 MED ORDER — ALPRAZOLAM 0.5 MG PO TABS: 0.5000 mg | ORAL_TABLET | Freq: Two times a day (BID) | ORAL | 2 refills | Status: DC | PRN

## 2018-05-18 NOTE — Patient Instructions (Addendum)
Double up on your sertraline until your current bottle of 50mg  runs out, then start the new prescription for 100mg . Try changing this to take at night before bed (unless you think it makes your sleeping worse.) Try taking the muscle relaxant cyclobenzaprine night before bed - lets see if it does help relax some of the muscular tightness and pressure your anxiety is causing as well as the jaw clenching. It should also help you sleep so try to take a break from the zquil for a while.  If your pain continues, we should try a mild anti-anxiety medicine - especially at night before bed or it might not be a bad idea to try an anti-inflammatory that is not hard on your stomach - like meloxicam - along with an acid reflux medicine - like prilosec, prevacid, protonix - meloxicam with dinner and then the reflux medicine 2 hrs after dinner right before bed.   We will see how you do on the above chagnes of cyclobenzaprine and sertraline first and the results of these tests and then decide whether we need to try something else.  Your EKG is perfect so INCREDIBLY unlikely to be your heart - ESPECIALLY consider you were having pain moments before. However, if you would still like more reassurance, I am more than happy to refer you for a stress test.  F/u in 3-4 weeks to ensure meds working and sxs resolving and discuss next steps.     If you have lab work done today you will be contacted with your lab results within the next 2 weeks.  If you have not heard from [place] then please contact us. The fastest way to get your results is to register for My Chart.   IF you received an x-ray today, you will receive an invoice from [facility]. Please contact [facility] at [phone] with questions or concerns regarding your invoice.   IF you received labwork today, you will receive an invoice from [facility]. Please contact [facility] at [phone] with questions or concerns regarding your invoice.   Our  billing staff will not be able to assist you with questions regarding bills from these companies.  You will be contacted with the lab results as soon as they are available. The fastest way to get your results is to activate your My Chart account. Instructions are located on the last page of this paperwork. If you have not heard from [place] regarding the results in 2 weeks, please contact this office.      Nonspecific Chest Pain, Adult Chest pain can be caused by many different conditions. It can be caused by a condition that is life-threatening and requires treatment right away. It can also be caused by something that is not life-threatening. If you have chest pain, it can be hard to know the difference, so it is important to get help right away to make sure that you do not have a serious condition. Some life-threatening causes of chest pain include:  Heart attack.  A tear in the body's main blood vessel (aortic dissection).  Inflammation around your heart (pericarditis).  A problem in the lungs, such as a blood clot (pulmonary embolism) or a collapsed lung (pneumothorax). Some non life-threatening causes of chest pain include:  Heartburn.  Anxiety or stress.  Damage to the bones, muscles, and cartilage that make up your chest wall.  Pneumonia or bronchitis.  Shingles infection (varicella-zoster virus). Chest pain can feel like:  Pain or discomfort on the surface of your  chest or deep in your chest.  Crushing, pressure, aching, or squeezing pain.  Burning or tingling.  Dull or sharp pain that is worse when you move, cough, or take a deep breath.  Pain or discomfort that is also felt in your back, neck, jaw, shoulder, or arm, or pain that spreads to any of these areas. Your chest pain may come and go. It may also be constant. Your health care provider will do lab tests and other studies to find the cause of your pain. Treatment will depend on the cause of your chest pain. Follow  these instructions at home: Medicines  Take over-the-counter and prescription medicines only as told by your health care provider.  If you were prescribed an antibiotic, take it as told by your health care provider. Do not stop taking the antibiotic even if you start to feel better. Lifestyle   Rest as directed by your health care provider.  Do not use any products that contain nicotine or tobacco, such as cigarettes and e-cigarettes. If you need help quitting, ask your health care provider.  Do not drink alcohol.  Make healthy lifestyle choices as recommended. These may include: ? Getting regular exercise. Ask your health care provider to suggest some activities that are safe for you. ? Eating a heart-healthy diet. This includes plenty of fresh fruits and vegetables, whole grains, low-fat (lean) protein, and low-fat dairy products. A dietitian can help you find healthy eating options. ? Maintaining a healthy weight. ? Managing any other health conditions you have, such as high blood pressure (hypertension) or diabetes. ? Reducing stress, such as with yoga or relaxation techniques. General instructions  Pay attention to any changes in your symptoms. Tell your health care provider about them or any new symptoms.  Avoid any activities that cause chest pain.  Keep all follow-up visits as told by your health care provider. This is important. This includes visits for any further testing if your chest pain does not go away. Contact a health care provider if:  Your chest pain does not go away.  You feel depressed.  You have a fever. Get help right away if:  Your chest pain gets worse.  You have a cough that gets worse, or you cough up blood.  You have severe pain in your abdomen.  You faint.  You have sudden, unexplained chest discomfort.  You have sudden, unexplained discomfort in your arms, back, neck, or jaw.  You have shortness of breath at any time.  You suddenly  start to sweat, or your skin gets clammy.  You feel nausea or you vomit.  You suddenly feel lightheaded or dizzy.  You have severe weakness, or unexplained weakness or fatigue.  Your heart begins to beat quickly, or it feels like it is skipping beats. These symptoms may represent a serious problem that is an emergency. Do not wait to see if the symptoms will go away. Get medical help right away. Call your local emergency services (911 in the [place] Do not drive yourself to the hospital. Summary  Chest pain can be caused by a condition that is serious and requires urgent treatment. It may also be caused by something that is not life-threatening.  If you have chest pain, it is very important to see your health care provider. Your health care provider may do lab tests and other studies to find the cause of your pain.  Follow your health care provider's instructions on taking medicines, making lifestyle changes, and getting emergency  treatment if symptoms become worse.  Keep all follow-up visits as told by your health care provider. This includes visits for any further testing if your chest pain does not go away. This information is not intended to replace advice given to you by your health care provider. Make sure you discuss any questions you have with your health care provider. Document Released: 02/17/2005 Document Revised: 11/10/2017 Document Reviewed: 11/10/2017 [employer]vier Interactive Patient Education  2019 [employer].

## 2018-05-18 NOTE — Progress Notes (Signed)
Subjective:    Patient: Debbie Shaw  DOB: May 20, 1971; 47 y.o.   MRN: [id number]  Chief Complaint  Patient presents with  . Anxiety    follow-up     HPI Has been struggling at work - 14 periods in 6 classes in 7 spots - so lots of mixed classes. Been in tears at work when talking at work - new Civil engineer, contracting so thinking about how to get her through this year and come back nest year. Waking up SHoB, CP, sweating - premenopausal, but worried about her heart so wants to get all checked out so she knows it is not her heart.  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 (tried sleep medicine trazodone 50mg  2 yrs ago which made her feel loopy). Sertraline 1.5 tabs qd Alprazolam use has needed when she wakes up in the middle of the night. Will not use it for several days and then having to use it several times in 1 day. Very rare heart burn or indigestion - well controlled. Occ CP in day but more at night - either in the middle she feels some pressure in the front in the upper middle and then radiates or alterntively or in additoin around left scapula.  Is pleuritic. CP occurring for the past 4-6 weeks. Is unchanged - some mild DOE while doing multiple flights of stairs at work. Is having every Wed off in January. Trying to spend those days getting massage, going  Is startin now and radiating to under arm.  Coughing more but thinks it is allergies. Occ painful when coughing.  Sometimes productive.  And she will have a little regurg.  Not using inhaler at all  Medical History Past Medical History:  Diagnosis Date  . Abnormal Pap smear of cervix    Since 2005 with biopsy under colposcopy  . Allergy   . Anxiety   . Asthma    as  child  . Depression   . GERD (gastroesophageal reflux disease)   . Panic attacks   . Pure hypercholesterolemia 02/27/2018   Past Surgical History:  Procedure Laterality Date  . CHOLECYSTECTOMY  age 29   crying, upset when waking up from anesthesia  . FRACTURE SURGERY    . KNEE ARTHROSCOPY     bilateral, multiple times  . KNEE ARTHROSCOPY W/ ACL RECONSTRUCTION Left    in later 1990s  . TONSILLECTOMY     Current Outpatient Medications on File Prior to Visit  Medication Sig Dispense Refill  . ALPRAZolam (XANAX) 0.5 MG tablet Take 1 tablet (0.5 mg total) by mouth 2 (two) times daily as needed for anxiety. 30 tablet 1  . Calcium-Magnesium-Vitamin D (CALCIUM MAGNESIUM PO) Take by mouth daily.    . Cholecalciferol (VITAMIN D PO) Take by mouth daily.    . DiphenhydrAMINE HCl, Sleep, (ZZZQUIL PO) Take 1 tablet by mouth at bedtime.    Marland Kitchen ibuprofen (ADVIL,MOTRIN) 200 MG tablet Take 200 mg by mouth as needed.    Marland Kitchen levocetirizine (XYZAL) 5 MG tablet Take 1 tablet (5 mg total) by mouth every evening. 30 tablet 11  . montelukast (SINGULAIR) 10 MG tablet TAKE 1 TABLET BY MOUTH EVERYDAY AT BEDTIME 90 tablet 1  .  Multiple Vitamin (MULTIVITAMIN) tablet Take 1 tablet by mouth daily.    . polyethylene glycol powder (GLYCOLAX/MIRALAX) powder Take 17 g by mouth 2 (two) times daily as needed. 578 g 1  . Probiotic Product (PROBIOTIC DAILY PO) Take by mouth.    . sertraline (ZOLOFT) 50 MG tablet Take 1.5 tablets (75 mg total) by mouth daily. 135 tablet 3  . valACYclovir (VALTREX) 1000 MG tablet Take 2 tablets (2,000 mg total) by mouth 2 (two) times daily. For 1 day 20 tablet 2   No current facility-administered medications on file prior to visit.    Allergies  Allergen Reactions  . Buprenorphine Hcl Nausea And Vomiting  . Morphine And Related Nausea And Vomiting  . 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 Sulfa Antibiotics Nausea And Vomiting    vomiting  . Sulfacetamide Sodium Nausea And Vomiting   Family History  Problem Relation Age of Onset  . Colon cancer Maternal Grandfather        unknown age  . Cancer Maternal Grandfather        colon  . Heart disease Maternal Grandmother   . Cancer Maternal Grandmother   . Thyroid disease Mother   . Hyperlipidemia Mother   . Irritable bowel syndrome Maternal Uncle        x 3  . Irritable bowel syndrome Cousin   . Esophageal cancer Neg Hx   . Rectal cancer Neg Hx   . Stomach cancer Neg Hx    Social History   Socioeconomic History  . Marital status: Married    Spouse name: Not on file  . Number of children: 3  . Years of education: Not on file  . Highest education level: Bachelor's degree (e.g., BA, AB, BS)  Occupational History  . Occupation: Teacher, early years/pre: [employer]  . Occupation: Clinical research associate: [employer]  . Financial resource strain: Not on file  . Food insecurity:    Worry: Not on file    Inability: Not on file  . Transportation needs:    Medical: Not on file    Non-medical: Not on file  Tobacco Use  . Smoking status: Never Smoker  . Smokeless tobacco: Never Used  Substance and Sexual Activity  . Alcohol use: Yes    Comment: 3-10 sometimes all, it depends  . Drug use: No  . Sexual activity: Yes    Birth control/protection: Surgical  Lifestyle  . Physical activity:    Days per week: Not on file    Minutes per session: Not on file  . Stress: Not on file  Relationships  . Social connections:    Talks on phone: Not on file    Gets together: Not on file    Attends religious service: Not on file    Active member of club or organization: Not on file    Attends meetings of clubs or organizations: Not on file    Relationship status: Not on file  Other Topics Concern  . Not on file  Social History Narrative   Lives with her husband and their  daughter.  They share custody of his two daughters with their mother. Education: [employer].    Depression screen [facility] 2/9 05/18/2018 03/07/2018 12/22/2017 10/04/2017 09/12/2017  Decreased Interest 0 0 0 0 0  Down, Depressed,  Hopeless 0 0 0 0 0  PHQ - 2 Score 0 0 0 0 0  Altered sleeping - - - - -  Tired, decreased energy - - - - -  Change in appetite - - - - -  Feeling bad or failure about yourself  - - - - -  Trouble concentrating - - - - -  Moving slowly or fidgety/restless - - - - -  Suicidal thoughts - - - - -  PHQ-9 Score - - - - -    ROS As noted in HPI  Objective:  BP 113/73   Pulse 74   Resp 16   Ht 5\' 8"  (1.727 m)   Wt 189 lb (85.7 kg)   SpO2 98%   BMI 28.74 kg/m  Physical Exam Constitutional:      General: She is not in acute distress.    Appearance: She is well-developed. She is not diaphoretic.  HENT:     Head: Normocephalic and atraumatic.     Right Ear: External ear normal.     Left Ear: External ear normal.  Eyes:     General: No scleral icterus.    Conjunctiva/sclera: Conjunctivae normal.  Neck:     Musculoskeletal: Normal range of motion and neck supple.     Thyroid: No thyromegaly.  Cardiovascular:     Rate and Rhythm: Normal rate and regular rhythm.     Pulses: Normal pulses.     Heart sounds: Normal heart sounds. No murmur. No friction rub. No gallop.   Pulmonary:     Effort: Pulmonary effort is normal. No respiratory distress.     Breath sounds: Normal breath sounds.  Chest:     Chest wall: Mass (~6-7th costosternal junction - chronic and unchanged, does not repllcate pain nor where the presenting pain is located. Does not have any chest wall ttp over mid sternum and around left scapula where nocturnal pleuritic pain is) and tenderness present.  Lymphadenopathy:     Cervical: No cervical adenopathy.  Skin:    General: Skin is warm and dry.     Findings: No erythema.  Neurological:     Mental Status: She is alert and oriented to person, place, and  time.  Psychiatric:        Behavior: Behavior normal.     [facility] TESTING Office Visit on 05/18/2018  Component Date Value Ref Range Status  . CRP 05/18/2018 <1  0 - 10 mg/L Final  . Sed Rate 05/18/2018 2  0 - 32 mm/hr Final  . WBC 05/18/2018 10.2  3.4 - 10.8 x10E3/uL Final  . RBC 05/18/2018 4.31  3.77 - 5.28 x10E6/uL Final  . Hemoglobin 05/18/2018 13.2  11.1 - 15.9 g/dL Final  . Hematocrit 05/18/2018 39.1  34.0 - 46.6 % Final  . MCV 05/18/2018 91  79 - 97 fL Final  . MCH 05/18/2018 30.6  26.6 - 33.0 pg Final  . MCHC 05/18/2018 33.8  31.5 - 35.7 g/dL Final  . RDW 05/18/2018 11.8* 12.3 - 15.4 % Final   Comment: **Effective May 29, 2018, the RDW pediatric reference**   interval will be removed and the adult reference interval   will be changing to:                             Female 11.7 - 15.4  Female 11.6 - 15.4   . Platelets 05/18/2018 268  150 - 450 x10E3/uL Final  . Neutrophils 05/18/2018 68  Not Estab. % Final  . Lymphs 05/18/2018 19  Not Estab. % Final  . Monocytes 05/18/2018 7  Not Estab. % Final  . Eos 05/18/2018 5  Not Estab. % Final  . Basos 05/18/2018 1  Not Estab. % Final  . Neutrophils Absolute 05/18/2018 7.0  1.4 - 7.0 x10E3/uL Final  . Lymphocytes Absolute 05/18/2018 2.0  0.7 - 3.1 x10E3/uL Final  . Monocytes Absolute 05/18/2018 0.7  0.1 - 0.9 x10E3/uL Final  . EOS (ABSOLUTE) 05/18/2018 0.5* 0.0 - 0.4 x10E3/uL Final  . Basophils Absolute 05/18/2018 0.1  0.0 - 0.2 x10E3/uL Final  . Immature Granulocytes 05/18/2018 0  Not Estab. % Final  . Immature Grans (Abs) 05/18/2018 0.0  0.0 - 0.1 x10E3/uL Final    EKG: NSR, no acute ischemic changes noted. Only change when compared to last prior EKG which was done 09/21/2011 is that pt NO LONGER HAS LONG QT TODAY - 2013 EKG QT/QTc was 448/473 msec and today is 436/443 msec.   I have personally reviewed the EKG tracing and agree with the computer interpretation with the exception  of left axis rotation. EKG appears normal to me. Sinus  Rhythm  -Left axis.   ABNORMAL  Assessment & Plan:  PREDICTED PEAK FLOW 482 L/MIN 1. Chest pain on breathing - pt reassured that there are no s/sxs that make this at all concerning for anginal etiology - pt here for reassurance on this today as that possibility was worsening her anxiety (the likely cause of the pain). She has noticed things starting to improve as she has taken steps to lessen her anxiety such as talking to her boss, taking a day a week off for self-care, etc.  Will increase zoloft.  Mentions that when she wakes with the chest pain she also is clenching her jaw and does have chronic costochondritis so recommend trying cyclobenzaprine nightly to help improve rest and sleep as well as helpful if her muscles relaxed it will cause less chest wall pain.  Hopefully this will allow her to take a break from the nightly ZzzQuil she is dependent on for sleep Rec CXR and peak flow but did not have staff to do today so pt will RTC for xray and nurse only visit for these tomorrow or Sat a.m. DDX also includes pleurisy, adult onset asthma/mild RAD w/ known seasonal allergies, costochondritis, GERD.  Very low suspicion for PE due to longstanding chronicity of nature, no risk factors, no DVT symptoms.  Nothing to suggest this is cardiac in nature.  However I am happy to refer patient for a stress echocardiogram if her symptoms continue and that would be reassuring to her. Recommend if symptoms do not improve with self-care, nightly Flexeril, and increased sertraline, next step would be to try patient on empiric PPI 30 minutes before bed as well as Cox 2 inhibitor with dinner for sev wks to target both potential pleurisy and costochondritis.  Alternatively, mild onset of uncontrolled adult asthma is certainly higher on the differential consider allergies/cough so if peak flow reduced, consider in office neb w/ repeat PFT to gauge response or could just  have pt start on symbicort bid - esp before bed    2. Night sweat -patient wondering if she is perimenopausal or if the sweats are part of her anxiety.  Would like gonadotropins checked to see if any indication of menopausal  status.  3. Dyspnea on exertion   4. GAD (generalized anxiety disorder) -suspect is because of symptoms, refilled alprazolam as patient usually uses very rarely but has had increased use recently though definitely does not take scheduled.  Increase sertraline from 75 to 100 mg/day-patient will reluctant as she would like to get off medications altogether but aware she needs to treat her anxiety.  Encourage therapy which she will consider.  If nocturnal symptoms continue, could try hydroxyzine, gabapentin, or buspirone.  5. Costochondritis - occ pain and ttp over left lower costosternal junction - around rib 6th or 7th the joint feels enlarged, more prominent, and is ttp - but this is chronic, not currently bothering her, and not replicative of the pleurtic radiating nocturnal pain.  Rec CXR to eval this as palpable enlargement and last chest imaging was CT 11/2013 (nml but occ calcified Rt lung granulomas).  6.      Pure hypercholesterolemia - Patient wants to know whether she should take an aspirin 81 mg, fish oil, and coenzyme Q 10 -her lipid panel at her last CPE on 12/22/17 was much worse than prior with her LDL increasing to 174 from 145 the year prior, and 115 the year prior to that.  I do think all of these things would be helpful to treat her hyperlipidemia in addition to low-fat diet and plenty of exercise but do not recommend starting at this point as may have the potential to worsen GERD symptoms which may be causing pain and are unlikely to improve/resolve this acute chest pain -so consider adding in fish oil and coenzyme Q 10 after this acute chest pain issue and anxiety has resolved.  Recheck in 3 to 4 weeks to assess symptom response to medication trials and next step if  this continues  **Rec peak flow at next OV - pt left today before this could be done.**  Patient will continue on current chronic medications other than changes noted above, so ok to refill when needed.   See after visit summary for patient specific instructions.  Orders Placed This Encounter  Procedures  . DG Chest 2 View    Standing Status:   Future    Standing Expiration Date:   05/18/2019    Order Specific Question:   Reason for Exam (SYMPTOM  OR DIAGNOSIS REQUIRED)    Answer:   pleuritic non-exertional episodic mid-sternal CP x 4-6 wks, radiating to left scapula    Order Specific Question:   Is the patient pregnant?    Answer:   No    Order Specific Question:   Preferred imaging location?    Answer:   [facility]  . C-reactive protein  . Sedimentation rate  . CBC with Differential/Platelet  . FSH/LH  . Care order/instruction:    Standing Status:   Future    Standing Expiration Date:   07/21/2018    Scheduling Instructions:     Peak Flow (IF NEB IS ORDERED PLEASE DO BEFORE AND AFTER NEB)  . EKG 12-Lead    Meds ordered this encounter  Medications  . sertraline (ZOLOFT) 100 MG tablet    Sig: Take 1 tablet (100 mg total) by mouth at bedtime.    Dispense:  90 tablet    Refill:  3  . ALPRAZolam (XANAX) 0.5 MG tablet    Sig: Take 1 tablet (0.5 mg total) by mouth 2 (two) times daily as needed for anxiety.    Dispense:  30 tablet    Refill:  2  Not to exceed 5 additional fills before 05/17/2018 DX Code Needed  .  . cyclobenzaprine (FLEXERIL) 10 MG tablet    Sig: Take 1 tablet (10 mg total) by mouth at bedtime. For costochondritis    Dispense:  30 tablet    Refill:  0    Patient verbalized to me that they understand the following: diagnosis, what is being done for them, what to expect and what should be done at home.  Their questions have been answered. They understand that I am unable to predict every possible medication interaction or adverse outcome and that if  any unexpected symptoms arise, they should contact us and their pharmacist, as well as never hesitate to seek urgent/emergent care at [facility] or ER if they think it might be warranted.    Over 40 min spent in face-to-face evaluation of and consultation with patient and coordination of care.  Over 50% of this time was spent counseling this patient regarding potential etiologies of CP - most likely - how we are going to try to tease out through testing and emperic therapies, likely role of anxiety, reassurance about unlikely to be cardiac nature, importance of further pulmonary eval w/ CXR/PFTs due to known h/o RAD and seasonal allergies, need of CXR to view chronic costosternal painful enlarged left 6-7 joint.  Delman Cheadle, MD, MPH [facility] [address] [place], [place]  [address] [phone] Office phone  [phone] Office fax  05/18/18 3:19 PM

## 2018-05-19 LAB — CBC WITH DIFFERENTIAL/PLATELET
BASOS ABS: 0.1 10*3/uL (ref 0.0–0.2)
Basos: 1 %
EOS (ABSOLUTE): 0.5 10*3/uL — AB (ref 0.0–0.4)
EOS: 5 %
Hematocrit: 39.1 % (ref 34.0–46.6)
Hemoglobin: 13.2 g/dL (ref 11.1–15.9)
IMMATURE GRANS (ABS): 0 10*3/uL (ref 0.0–0.1)
IMMATURE GRANULOCYTES: 0 %
LYMPHS ABS: 2 10*3/uL (ref 0.7–3.1)
Lymphs: 19 %
MCH: 30.6 pg (ref 26.6–33.0)
MCHC: 33.8 g/dL (ref 31.5–35.7)
MCV: 91 fL (ref 79–97)
Monocytes Absolute: 0.7 10*3/uL (ref 0.1–0.9)
Monocytes: 7 %
NEUTROS ABS: 7 10*3/uL (ref 1.4–7.0)
Neutrophils: 68 %
PLATELETS: 268 10*3/uL (ref 150–450)
RBC: 4.31 x10E6/uL (ref 3.77–5.28)
RDW: 11.8 % — ABNORMAL LOW (ref 12.3–15.4)
WBC: 10.2 10*3/uL (ref 3.4–10.8)

## 2018-05-19 LAB — C-REACTIVE PROTEIN: CRP: <1 mg/L (ref 0–10)

## 2018-05-19 LAB — SEDIMENTATION RATE: Sed Rate: 2 mm/hr (ref 0–32)

## 2018-05-20 ENCOUNTER — Encounter: Payer: Self-pay | Admitting: Family Medicine

## 2018-05-20 DIAGNOSIS — M940 Chondrocostal junction syndrome [Tietze]: Secondary | ICD-10-CM | POA: Insufficient documentation

## 2018-05-23 LAB — FSH/LH
FSH: 7.4 m[IU]/mL
LH: 6.2 m[IU]/mL

## 2018-06-01 ENCOUNTER — Telehealth: Payer: Self-pay | Admitting: Family Medicine

## 2018-06-01 NOTE — Telephone Encounter (Signed)
Copied from [place] [id number]. Topic: General - Other >> May 31, 2018  1:18 PM Leward Quan A wrote: Reason for CRM: Patient called to say that Dr Brigitte Pulse want her to come in and have a breathing test also a chest Xray. Stated that she went to get Xray elsewhere but due to the business of that place she was unable to have it done. She would like to come in to the [facility] office to have this done and request a call back to let her know if that's ok. Please advise Ph# [phone]

## 2018-06-09 NOTE — Telephone Encounter (Signed)
Left message on pt's voicemail per Franki Monte, she may come to the office to get x-ray done. Order was placed on 05/18/18. Pt advised to come in early next week before 06/18/2018. Per Aaron Edelman order is for good for 30 days.

## 2018-06-12 ENCOUNTER — Ambulatory Visit: Payer: BC Managed Care – PPO

## 2018-06-12 ENCOUNTER — Ambulatory Visit (INDEPENDENT_AMBULATORY_CARE_PROVIDER_SITE_OTHER): Payer: BC Managed Care – PPO | Admitting: Family Medicine

## 2018-06-12 ENCOUNTER — Ambulatory Visit (INDEPENDENT_AMBULATORY_CARE_PROVIDER_SITE_OTHER): Payer: BC Managed Care – PPO

## 2018-06-12 DIAGNOSIS — R071 Chest pain on breathing: Secondary | ICD-10-CM

## 2018-06-12 NOTE — Progress Notes (Signed)
Here for nurse visit only. Pt not seen.

## 2018-06-21 ENCOUNTER — Other Ambulatory Visit: Payer: Self-pay | Admitting: Family Medicine

## 2018-06-21 NOTE — Telephone Encounter (Signed)
Requested medication (s) are due for refill today -yes  Requested medication (s) are on the active medication list -yes  Future visit scheduled -no  Last refill: 05/18/18  Notes to clinic: patient is requesting non delegated medication- sent to PCP for review.  Requested Prescriptions  Pending Prescriptions Disp Refills   cyclobenzaprine (FLEXERIL) 10 MG tablet [Pharmacy Med Name: CYCLOBENZAPRINE 10 MG TABLET] 30 tablet 0    Sig: Take 1 tablet (10 mg total) by mouth at bedtime. For costochondritis     Not Delegated - Analgesics:  Muscle Relaxants Failed - 06/21/2018  9:35 AM      Failed - This refill cannot be delegated      Passed - Valid encounter within last 6 months    Recent Outpatient Visits          1 week ago Chest pain on breathing   [facility], Ranell Patrick, MD   1 month ago Chest pain on breathing   [facility], Laurey Arrow, MD   3 months ago Constipation, unspecified constipation type   [facility] at [facility], Gelene Mink, PA-C   6 months ago Annual physical exam   [facility], Laurey Arrow, MD   8 months ago Nonintractable headache, unspecified chronicity pattern, unspecified headache type   Primary Care at [facility], Gelene Mink, [place]              Requested Prescriptions  Pending Prescriptions Disp Refills   cyclobenzaprine (FLEXERIL) 10 MG tablet [Pharmacy Med Name: CYCLOBENZAPRINE 10 MG TABLET] 30 tablet 0    Sig: Take 1 tablet (10 mg total) by mouth at bedtime. For costochondritis     Not Delegated - Analgesics:  Muscle Relaxants Failed - 06/21/2018  9:35 AM      Failed - This refill cannot be delegated      Passed - Valid encounter within last 6 months    Recent Outpatient Visits          1 week ago Chest pain on breathing   [facility], Ranell Patrick, MD   1 month ago Chest pain on breathing   [facility], Laurey Arrow, MD   3 months ago Constipation, unspecified  constipation type   [facility] at [facility], Gelene Mink, PA-C   6 months ago Annual physical exam   [facility], Laurey Arrow, MD   8 months ago Nonintractable headache, unspecified chronicity pattern, unspecified headache type   Primary Care at [facility], [place], [place]

## 2018-07-14 ENCOUNTER — Other Ambulatory Visit: Payer: Self-pay

## 2018-07-14 ENCOUNTER — Encounter (HOSPITAL_BASED_OUTPATIENT_CLINIC_OR_DEPARTMENT_OTHER): Payer: Self-pay | Admitting: *Deleted

## 2018-07-14 ENCOUNTER — Emergency Department (HOSPITAL_BASED_OUTPATIENT_CLINIC_OR_DEPARTMENT_OTHER): Payer: BC Managed Care – PPO

## 2018-07-14 ENCOUNTER — Ambulatory Visit: Payer: Self-pay | Admitting: Family Medicine

## 2018-07-14 ENCOUNTER — Emergency Department (HOSPITAL_BASED_OUTPATIENT_CLINIC_OR_DEPARTMENT_OTHER)
Admission: EM | Admit: 2018-07-14 | Discharge: 2018-07-14 | Disposition: A | Discharge status: Home / Self Care | Payer: BC Managed Care – PPO | Attending: Emergency Medicine | Admitting: Emergency Medicine

## 2018-07-14 DIAGNOSIS — G5620 Lesion of ulnar nerve, unspecified upper limb: Secondary | ICD-10-CM | POA: Insufficient documentation

## 2018-07-14 DIAGNOSIS — R200 Anesthesia of skin: Secondary | ICD-10-CM | POA: Diagnosis present

## 2018-07-14 DIAGNOSIS — R519 Headache, unspecified: Secondary | ICD-10-CM

## 2018-07-14 DIAGNOSIS — Z79899 Other long term (current) drug therapy: Secondary | ICD-10-CM | POA: Insufficient documentation

## 2018-07-14 DIAGNOSIS — R51 Headache: Secondary | ICD-10-CM | POA: Diagnosis not present

## 2018-07-14 DIAGNOSIS — G5623 Lesion of ulnar nerve, bilateral upper limbs: Secondary | ICD-10-CM

## 2018-07-14 DIAGNOSIS — J45909 Unspecified asthma, uncomplicated: Secondary | ICD-10-CM | POA: Diagnosis not present

## 2018-07-14 NOTE — ED Notes (Addendum)
C/o rt arm tingling and pain onset 0300 this am  Grips equal bilateral  Has had same over the past year, has occurred in each arm

## 2018-07-14 NOTE — Telephone Encounter (Signed)
She called in c/o numbness in her right hand from her fingers going up to her elbow since she woke up this morning.   She can move it but it feels "weird".   See triage notes for complete details.  She is going to the ED at [facility] on [address] now.    Reason for Disposition . [1] Numbness (i.e., loss of sensation) of the face, arm / hand, or leg / foot on one side of the body AND [2] sudden onset AND [3] present now  Answer Assessment - Initial Assessment Questions 1. SYMPTOM: "What is the main symptom you are concerned about?" (e.g., weakness, numbness)     At least a year and one of my arms or hands are numb like pins and needles.    I've been seeing a Restaurant manager, fast food. This morning I woke up with elbow to your fingers feeling weird and like pins and needles.    This is on the right side. 2. ONSET: "When did this start?" (minutes, hours, days; while sleeping)     Yesterday my back was hurting from packing because we are remodeling the house.     When I woke up this morning.    Right now it still does not feel right.   I can move it but it feels weird. 3. LAST NORMAL: "When was the last time you were normal (no symptoms)?"     Last night at 11:00 PM.    Woke up during the night about 2-3:00am went to bathroom and was fine. 4. PATTERN "Does this come and go, or has it been constant since it started?"  "Is it present now?"     Constant now. 5. CARDIAC SYMPTOMS: "Have you had any of the following symptoms: chest pain, difficulty breathing, palpitations?"     Dr. Brigitte Pulse did a EKG because I was having chest pain and shortness of breath.    I have costochondritis.   She gave me muscle relaxers  which helped. 6. NEUROLOGIC SYMPTOMS: "Have you had any of the following symptoms: headache, dizziness, vision loss, double vision, changes in speech, unsteady on your feet?"     I do have a headache since waking up, no dizziness or vision changes.    I feel like the front of my head hurts like a sinus  infection.     I have bad allergies.   I feel a little congestion in the back of my throat.   My whole family was sick with a respiratory virus 2 weeks ago. 7. OTHER SYMPTOMS: "Do you have any other symptoms?"     It's feeling weird again. 8. PREGNANCY: "Is there any chance you are pregnant?" "When was your last menstrual period?"     Having period now.  Protocols used: NEUROLOGIC DEFICIT-A-AH

## 2018-07-14 NOTE — ED Provider Notes (Signed)
[facility] DEPARTMENT Provider Note   CSN: [id number] Arrival date & time: 07/14/18  1050    History   Chief Complaint Chief Complaint  Patient presents with  . Numbness    HPI Debbie Shaw is a 48 y.o. female.     48 year old female presents with complaint of tingling sensation in her right forearm from medial elbow to right third through fifth fingers.  Patient reports similar symptoms affecting both arms off and on for the past year.  Patient woke up with this sensation this morning as well as a headache primarily on the right side of her head as well as forehead associated with nausea, not similar to previous headaches.  Patient called her PCP who recommended she come to the emergency room for evaluation.  Patient felt anxious and took her Xanax, currently feels a little sleepy otherwise denies changes in vision, speech, gait.  LMP now, took Midol for her headache other complaints or concerns.     Past Medical History:  Diagnosis Date  . Abnormal Pap smear of cervix    Since 2005 with biopsy under colposcopy  . Allergy   . Anxiety   . Asthma    as child  . Depression   . GERD (gastroesophageal reflux disease)   . Panic attacks   . Pure hypercholesterolemia 02/27/2018    Patient Active Problem List   Diagnosis Date Noted  . Costochondritis 05/20/2018  . Environmental and seasonal allergies 02/27/2018  . H/O cold sores 02/27/2018  . Pure hypercholesterolemia 02/27/2018  . New onset headache 02/27/2018  . 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 Anxiety 01/30/2013  . FIBROCYSTIC BREAST DISEASE 02/10/2009  . FATIGUE 02/10/2009    Past Surgical History:  Procedure Laterality Date  . CHOLECYSTECTOMY  age 37   crying, upset when waking up from anesthesia  . FRACTURE SURGERY    . KNEE ARTHROSCOPY     bilateral, multiple times  . KNEE ARTHROSCOPY W/ ACL RECONSTRUCTION Left    in later 1990s  . TONSILLECTOMY       OB History     No obstetric history on file.      Home Medications    Prior to Admission medications   Medication Sig Start Date End Date Taking? Authorizing Provider  ALPRAZolam Duanne Moron) 0.5 MG tablet Take 1 tablet (0.5 mg total) by mouth 2 (two) times daily as needed for anxiety. 05/18/18   Shawnee Knapp, MD  Ascorbic Acid (VITAMIN C PO) Take by mouth.    [provider]  Calcium-Magnesium-Vitamin D (CALCIUM MAGNESIUM PO) Take by mouth daily.    [provider]  Cholecalciferol (VITAMIN D PO) Take by mouth daily.    [provider]  cyclobenzaprine (FLEXERIL) 10 MG tablet TAKE 1 TABLET (10 MG TOTAL) BY MOUTH AT BEDTIME. FOR COSTOCHONDRITIS 06/22/18   Shawnee Knapp, MD  DiphenhydrAMINE HCl, Sleep, (ZZZQUIL PO) Take 1 tablet by mouth at bedtime.    [provider]  ibuprofen (ADVIL,MOTRIN) 200 MG tablet Take 200 mg by mouth as needed.    [provider]  levocetirizine (XYZAL) 5 MG tablet Take 1 tablet (5 mg total) by mouth every evening. 12/22/17   Shawnee Knapp, MD  montelukast (SINGULAIR) 10 MG tablet TAKE 1 TABLET BY MOUTH EVERYDAY AT BEDTIME 04/11/18   Shawnee Knapp, MD  Multiple Vitamin (MULTIVITAMIN) tablet Take 1 tablet by mouth daily.    [provider]  polyethylene glycol powder (GLYCOLAX/MIRALAX) powder Take 17  g by mouth 2 (two) times daily as needed. 03/07/18   McVey, Gelene Mink, PA-C  Probiotic Product (PROBIOTIC DAILY PO) Take by mouth.    [provider]  sertraline (ZOLOFT) 100 MG tablet Take 1 tablet (100 mg total) by mouth at bedtime. 05/18/18 05/18/19  Shawnee Knapp, MD  valACYclovir (VALTREX) 1000 MG tablet Take 2 tablets (2,000 mg total) by mouth 2 (two) times daily. For 1 day 12/22/17   Shawnee Knapp, MD    Family History Family History  Problem Relation Age of Onset  . Colon cancer Maternal Grandfather        unknown age  . Cancer Maternal Grandfather        colon  . Heart disease Maternal Grandmother   . Cancer Maternal  Grandmother   . Thyroid disease Mother   . Hyperlipidemia Mother   . Irritable bowel syndrome Maternal Uncle        x 3  . Irritable bowel syndrome Cousin   . Esophageal cancer Neg Hx   . Rectal cancer Neg Hx   . Stomach cancer Neg Hx     Social History Social History   Tobacco Use  . Smoking status: Never Smoker  . Smokeless tobacco: Never Used  Substance Use Topics  . Alcohol use: Yes    Comment: 3-10 sometimes all, it depends  . Drug use: No     Allergies   Buprenorphine hcl; Morphine and related; Other; Sulfa antibiotics; and Sulfacetamide sodium   Review of Systems Review of Systems  Constitutional: Negative for chills, diaphoresis and fever.  Eyes: Negative for visual disturbance.  Gastrointestinal: Negative for nausea and vomiting.  Musculoskeletal: Negative for gait problem and neck pain.  Skin: Negative for rash and wound.  Allergic/Immunologic: Negative for immunocompromised state.  Neurological: Positive for numbness and headaches. Negative for dizziness, facial asymmetry, speech difficulty, weakness and light-headedness.  Hematological: Does not bruise/bleed easily.  Psychiatric/Behavioral: Negative for confusion.  All other systems reviewed and are negative.    Physical Exam Updated Vital Signs BP 117/79   Pulse 84   Temp 97.7 F (36.5 C) (Oral)   Resp 20   Ht 5' 8.5" (1.74 m)   Wt 85.7 kg   LMP 07/11/2018   SpO2 100%   BMI 28.32 kg/m   Physical Exam Vitals signs and nursing note reviewed.  Constitutional:      General: She is not in acute distress.    Appearance: Normal appearance. She is well-developed. She is not diaphoretic.  HENT:     Head: Normocephalic and atraumatic.     Nose: Nose normal.     Mouth/Throat:     Mouth: Mucous membranes are moist.  Eyes:     General: No visual field deficit.    Extraocular Movements: Extraocular movements intact.     Pupils: Pupils are equal, round, and reactive to light.  Neck:      Musculoskeletal: Neck supple. No muscular tenderness.  Cardiovascular:     Rate and Rhythm: Normal rate and regular rhythm.     Pulses: Normal pulses.     Heart sounds: Normal heart sounds.  Pulmonary:     Effort: Pulmonary effort is normal.     Breath sounds: Normal breath sounds.  Musculoskeletal:        General: No swelling or tenderness.  Skin:    General: Skin is warm and dry.     Findings: No erythema or rash.  Neurological:     Mental Status: She  is alert and oriented to person, place, and time.     GCS: GCS eye subscore is 4. GCS verbal subscore is 5. GCS motor subscore is 6.     Cranial Nerves: Cranial nerves are intact. No cranial nerve deficit or facial asymmetry.     Sensory: Sensation is intact. No sensory deficit.     Motor: Motor function is intact. No weakness or pronator drift.     Coordination: Coordination normal. Heel to Shin Test normal. Rapid alternating movements normal.     Gait: Gait normal.     Deep Tendon Reflexes: Reflexes normal.  Psychiatric:        Behavior: Behavior normal.      ED Treatments / Results  Labs (all labs ordered are listed, but only abnormal results are displayed) Labs Reviewed - No data to display  EKG None  Radiology Ct Head Wo Contrast  Result Date: 07/14/2018 CLINICAL DATA:  Headache EXAM: CT HEAD WITHOUT CONTRAST TECHNIQUE: Contiguous axial images were obtained from the base of the skull through the vertex without intravenous contrast. COMPARISON:  None. FINDINGS: Brain: No evidence of acute infarction, hemorrhage, hydrocephalus, extra-axial collection or mass lesion/mass effect. Vascular: No hyperdense vessel or unexpected calcification. Skull: Normal. Negative for fracture or focal lesion. Sinuses/Orbits: No acute finding. Other: None. IMPRESSION: No acute intracranial pathology. No non-contrast CT findings to explain headache. Electronically Signed   By: Eddie Candle M.D.   On: 07/14/2018 11:54    Procedures Procedures  (including critical care time)  Medications Ordered in ED Medications - No data to display   Initial Impression / Assessment and Plan / ED Course  I have reviewed the triage vital signs and the nursing notes.  Pertinent labs & imaging results that were available during my care of the patient were reviewed by me and considered in my medical decision making (see chart for details).  Clinical Course as of Jul 14 1404  Fri Feb 21, 328  [id number] 48 year old female with complaint of acute on chronic tingling in her right arm from medial elbow to right third through fifth fingers also complaint of headache this morning which is not a normal headache for her.  Exam is unremarkable, grip strength symmetric, reflex symmetric.  Neuro exam normal.  A CT of the head unremarkable.  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  Recommend patient follow-up with her PCP, return to ER for new or worsening symptoms.  Patient agreeable with plan of care.   [LM]    Clinical Course User Index [LM] Tacy Learn, PA-C   Final Clinical Impressions(s) / ED Diagnoses   Final diagnoses:  None    ED Discharge Orders    None       Tacy Learn, PA-C 07/14/18 1406    Long, Wonda Olds, MD 07/15/18 1254

## 2018-07-14 NOTE — ED Triage Notes (Signed)
For a year when she wakes her hands and arms. Yesterday she was lifting and moving boxes and had back pain. She took Flexeril last night.

## 2018-07-14 NOTE — Discharge Instructions (Signed)
Take Motrin or Aleve daily as directed for 10 days with food.  Follow-up with your primary care provider.

## 2018-07-18 ENCOUNTER — Ambulatory Visit: Payer: Self-pay

## 2018-07-18 NOTE — Telephone Encounter (Signed)
Pt is requesting Tamiflu. Pt's daughter became febrile on Sunday and mother slept in the same bed. Today is day 4 after exposure. Pt has no high risk problems or a weakened immune system. If prescription is approved, please call to [facility] [employer] on [address]  Reason for Disposition . [1]Influenza EXPOSURE  (Close Contact) within last 7 days AND [2] NO respiratory symptoms  Answer Assessment - Initial Assessment Questions 1. TYPE of EXPOSURE: "How were you exposed?" (e.g., close contact, not a close contact)     daughter 2. DATE of EXPOSURE: "When did the exposure occur?" (e.g., hour, days, weeks)     Sunday morning 3. PREGNANCY: "Is there any chance you are pregnant?" "When was your last menstrual period?"     No LMP: last Monday 4. HIGH RISK for COMPLICATIONS: "Do you have any heart or lung problems? Do you have a weakened immune system?" (e.g., CHF, COPD, asthma, HIV positive, chemotherapy, renal failure, diabetes mellitus, sickle cell anemia)     No-no 5. SYMPTOMS: "Do you have any symptoms?" (e.g., cough, fever, sore throat, difficulty breathing).     no  Protocols used: INFLUENZA EXPOSURE-A-AH

## 2018-07-21 NOTE — Telephone Encounter (Signed)
Okay to send Tamiflu due to being exposed?

## 2018-08-22 ENCOUNTER — Other Ambulatory Visit: Payer: Self-pay | Admitting: Family Medicine

## 2018-08-22 NOTE — Telephone Encounter (Signed)
Requested medication (s) are due for refill today: yes  Requested medication (s) are on the active medication list: yes  Last refill:  06/22/18  Future visit scheduled: no  Notes to clinic:  Medication not delegated to NT to refill   Requested Prescriptions  Pending Prescriptions Disp Refills   cyclobenzaprine (FLEXERIL) 10 MG tablet [Pharmacy Med Name: CYCLOBENZAPRINE 10 MG TABLET] 30 tablet 1    Sig: TAKE 1 TABLET BY MOUTH AT BEDTIME     Not Delegated - Analgesics:  Muscle Relaxants Failed - 08/22/2018 11:57 AM      Failed - This refill cannot be delegated      Passed - Valid encounter within last 6 months    Recent Outpatient Visits          2 months ago Chest pain on breathing   [facility], Ranell Patrick, MD   3 months ago Chest pain on breathing   [facility], Laurey Arrow, MD   5 months ago Constipation, unspecified constipation type   [facility], Gelene Mink, PA-C   8 months ago Annual physical exam   [facility], Laurey Arrow, MD   10 months ago Nonintractable headache, unspecified chronicity pattern, unspecified headache type   [facility] at [facility], [place], [place]

## 2018-09-28 ENCOUNTER — Encounter: Payer: Self-pay | Admitting: Physician Assistant

## 2018-09-29 NOTE — Telephone Encounter (Signed)
Please reach out to [name] to schedule an appointment for numbness in her hand/fingers.

## 2018-12-08 ENCOUNTER — Encounter: Payer: Self-pay | Admitting: Family Medicine

## 2018-12-13 ENCOUNTER — Encounter: Payer: BC Managed Care – PPO | Admitting: Registered Nurse

## 2018-12-18 ENCOUNTER — Encounter: Payer: Self-pay | Admitting: Registered Nurse

## 2018-12-18 ENCOUNTER — Other Ambulatory Visit: Payer: Self-pay

## 2018-12-18 ENCOUNTER — Ambulatory Visit (INDEPENDENT_AMBULATORY_CARE_PROVIDER_SITE_OTHER): Payer: BC Managed Care – PPO | Admitting: Registered Nurse

## 2018-12-18 VITALS — BP 111/71 | HR 87 | Temp 99.0°F | Resp 16 | Ht 69.29 in | Wt 194.8 lb

## 2018-12-18 DIAGNOSIS — E7800 Pure hypercholesterolemia, unspecified: Secondary | ICD-10-CM | POA: Diagnosis not present

## 2018-12-18 DIAGNOSIS — Z130 Encounter for screening for diseases of the blood and blood-forming organs and certain disorders involving the immune mechanism: Secondary | ICD-10-CM

## 2018-12-18 DIAGNOSIS — R102 Pelvic and perineal pain: Secondary | ICD-10-CM

## 2018-12-18 DIAGNOSIS — Z0001 Encounter for general adult medical examination with abnormal findings: Secondary | ICD-10-CM

## 2018-12-18 DIAGNOSIS — Z13228 Encounter for screening for other metabolic disorders: Secondary | ICD-10-CM

## 2018-12-18 DIAGNOSIS — F411 Generalized anxiety disorder: Secondary | ICD-10-CM

## 2018-12-18 DIAGNOSIS — Z1329 Encounter for screening for other suspected endocrine disorder: Secondary | ICD-10-CM | POA: Diagnosis not present

## 2018-12-18 LAB — POCT URINALYSIS DIP (MANUAL ENTRY)
Bilirubin, UA: NEGATIVE
Blood, UA: NEGATIVE
Glucose, UA: NEGATIVE mg/dL
Ketones, POC UA: NEGATIVE mg/dL
Leukocytes, UA: NEGATIVE
Nitrite, UA: NEGATIVE
Protein Ur, POC: NEGATIVE mg/dL
Spec Grav, UA: 1.02 (ref 1.010–1.025)
Urobilinogen, UA: 0.2 E.U./dL
pH, UA: 6 (ref 5.0–8.0)

## 2018-12-18 MED ORDER — BUSPIRONE HCL 10 MG PO TABS: 10.0000 mg | ORAL_TABLET | Freq: Three times a day (TID) | ORAL | 0 refills | Status: DC

## 2018-12-18 MED ORDER — ALPRAZOLAM 0.5 MG PO TABS: 0.5000 mg | ORAL_TABLET | Freq: Two times a day (BID) | ORAL | 0 refills | Status: DC | PRN

## 2018-12-18 NOTE — Patient Instructions (Signed)
° ° ° °  If you have lab work done today you will be contacted with your lab results within the next 2 weeks.  If you have not heard from us then please contact us. The fastest way to get your results is to register for My Chart. ° ° °IF you received an x-ray today, you will receive an invoice from [facility]. Please contact [facility] at [phone] with questions or concerns regarding your invoice.  ° °IF you received labwork today, you will receive an invoice from [facility]. Please contact [facility] at [phone] with questions or concerns regarding your invoice.  ° °Our billing staff will not be able to assist you with questions regarding bills from these companies. ° °You will be contacted with the lab results as soon as they are available. The fastest way to get your results is to activate your My Chart account. Instructions are located on the last page of this paperwork. If you have not heard from us regarding the results in 2 weeks, please contact this office. °  ° ° ° °

## 2018-12-18 NOTE — Progress Notes (Signed)
Established Patient Office Visit  Subjective:  Patient ID: Debbie Shaw, female    DOB: Sep 26, 1970  Age: 48 y.o. MRN: [id number]  CC:  Chief Complaint  Patient presents with  . Annual Exam    HPI Debbie Shaw presents for CPE and labs.  C/o abdominal pain in LLQ and epigastric areas. States it has been intermittent for 1-2 months. It is crampy. It is not worsened or improved by meals. Denies UAB, discharge, odor, itching, exposure to STIs, pregnancy (husband had vasectomy), board like abdomen, preceding injury, GERD. Has history of cholecystectomy.  Otherwise, wants to discuss anxiety - interested in switching from alprazolam to BuSpar.   Had pap wnl - not due today. Had colonoscopy after hx of internal hemorrhoids, due again in 1-2 years for benign polyps on previous study. Otherwise, up to date on preventative care.   Past Medical History:  Diagnosis Date  . Abnormal Pap smear of cervix    Since 2005 with biopsy under colposcopy  . Allergy   . Anxiety   . Asthma    as child  . Depression   . GERD (gastroesophageal reflux disease)   . Panic attacks   . Pure hypercholesterolemia 02/27/2018    Past Surgical History:  Procedure Laterality Date  . CHOLECYSTECTOMY  age 47   crying, upset when waking up from anesthesia  . FRACTURE SURGERY    . KNEE ARTHROSCOPY     bilateral, multiple times  . KNEE ARTHROSCOPY W/ ACL RECONSTRUCTION Left    in later 1990s  . TONSILLECTOMY      Family History  Problem Relation Age of Onset  . Colon cancer Maternal Grandfather        unknown age  . Cancer Maternal Grandfather        colon  . Heart disease Maternal Grandmother   . Cancer Maternal Grandmother   . Thyroid disease Mother   . Hyperlipidemia Mother   . Irritable bowel syndrome Maternal Uncle        x 3  . Irritable bowel syndrome Cousin   . Esophageal cancer Neg Hx   . Rectal cancer Neg Hx   . Stomach cancer Neg Hx     Social History    Socioeconomic History  . Marital status: Married    Spouse name: Not on file  . Number of children: 3  . Years of education: Not on file  . Highest education level: Bachelor's degree (e.g., BA, AB, BS)  Occupational History  . Occupation: Teacher, early years/pre: [employer]  . Occupation: Clinical research associate: [employer]  . Financial resource strain: Not hard at all  . Food insecurity    Worry: Never true    Inability: Never true  . Transportation needs    Medical: No    Non-medical: No  Tobacco Use  . Smoking status: Never Smoker  . Smokeless tobacco: Never Used  Substance and Sexual Activity  . Alcohol use: Yes    Comment: 3-10 sometimes all, it depends  . Drug use: No  . Sexual activity: Yes    Birth control/protection: Surgical  Lifestyle  . Physical activity    Days per week: 3 days    Minutes per session: 30 min  . Stress: Rather much  Relationships  . Social Herbalist on phone: Three times a week    Gets together: Twice a week    Attends religious service:  Patient refused    Active member of club or organization: Patient refused    Attends meetings of clubs or organizations: Patient refused    Relationship status: Married  . Intimate partner violence    Fear of current or ex partner: No    Emotionally abused: No    Physically abused: No    Forced sexual activity: No  Other Topics Concern  . Not on file  Social History Narrative   Lives with her husband and their daughter.  They share custody of his two daughters with their mother. Education: [employer].     Outpatient Medications Prior to Visit  Medication Sig Dispense Refill  . ALPRAZolam (XANAX) 0.5 MG tablet Take 1 tablet (0.5 mg total) by mouth 2 (two) times daily as needed for anxiety. 30 tablet 2  . Ascorbic Acid (VITAMIN C PO) Take by mouth.    . Calcium-Magnesium-Vitamin D (CALCIUM MAGNESIUM PO) Take by mouth daily.    . Cholecalciferol (VITAMIN D PO) Take by  mouth daily.    . cyclobenzaprine (FLEXERIL) 10 MG tablet TAKE 1 TABLET (10 MG TOTAL) BY MOUTH AT BEDTIME. FOR COSTOCHONDRITIS 30 tablet 0  . DiphenhydrAMINE HCl, Sleep, (ZZZQUIL PO) Take 1 tablet by mouth at bedtime.    Marland Kitchen ibuprofen (ADVIL,MOTRIN) 200 MG tablet Take 200 mg by mouth as needed.    Marland Kitchen levocetirizine (XYZAL) 5 MG tablet Take 1 tablet (5 mg total) by mouth every evening. 30 tablet 11  . montelukast (SINGULAIR) 10 MG tablet TAKE 1 TABLET BY MOUTH EVERYDAY AT BEDTIME 90 tablet 1  . Multiple Vitamin (MULTIVITAMIN) tablet Take 1 tablet by mouth daily.    . polyethylene glycol powder (GLYCOLAX/MIRALAX) powder Take 17 g by mouth 2 (two) times daily as needed. 578 g 1  . Probiotic Product (PROBIOTIC DAILY PO) Take by mouth.    . sertraline (ZOLOFT) 100 MG tablet Take 1 tablet (100 mg total) by mouth at bedtime. 90 tablet 3  . valACYclovir (VALTREX) 1000 MG tablet Take 2 tablets (2,000 mg total) by mouth 2 (two) times daily. For 1 day 20 tablet 2   No facility-administered medications prior to visit.     Allergies  Allergen Reactions  . Buprenorphine Hcl Nausea And Vomiting  . Morphine And Related Nausea And Vomiting  . 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 Sulfa Antibiotics Nausea And Vomiting    vomiting  . Sulfacetamide Sodium Nausea And Vomiting    ROS Review of Systems  Constitutional: Positive for activity change. Negative for appetite change, fatigue, fever and unexpected weight change.  HENT: Negative.   Eyes: Negative.   Respiratory: Negative.   Cardiovascular: Negative.   Gastrointestinal: Positive for abdominal pain, constipation and diarrhea. Negative for abdominal distention, anal bleeding, blood in stool, nausea, rectal pain and vomiting.  Endocrine: Negative.   Genitourinary: Negative.   Musculoskeletal: Negative.   Skin: Negative.    Allergic/Immunologic: Negative.   Neurological: Negative.  Negative for dizziness, light-headedness and headaches.  Hematological: Negative.   Psychiatric/Behavioral: Negative for suicidal ideas. The patient is nervous/anxious.   All other systems reviewed and are negative.     Objective:    Physical Exam  Constitutional: She is oriented to person, place, and time. She appears well-developed and well-nourished.  HENT:  Head: Normocephalic and atraumatic.  Right Ear: External ear normal.  Left Ear: External ear  normal.  Nose: Nose normal.  Mouth/Throat: Oropharynx is clear and moist. No oropharyngeal exudate.  Eyes: Pupils are equal, round, and reactive to light. Conjunctivae and EOM are normal. Right eye exhibits no discharge. Left eye exhibits no discharge. No scleral icterus.  Neck: Normal range of motion. Neck supple. No tracheal deviation present. No thyromegaly present.  Cardiovascular: Normal rate, regular rhythm, normal heart sounds and intact distal pulses. Exam reveals no gallop and no friction rub.  No murmur heard. Pulmonary/Chest: Effort normal and breath sounds normal. No respiratory distress. She has no wheezes. She has no rales. She exhibits no tenderness.  Abdominal: Soft. Normal appearance, normal aorta and bowel sounds are normal. She exhibits no shifting dullness, no distension, no pulsatile liver, no fluid wave, no abdominal bruit, no ascites, no pulsatile midline mass and no mass. There is no hepatosplenomegaly, splenomegaly or hepatomegaly. There is abdominal tenderness in the epigastric area, periumbilical area and left lower quadrant. There is guarding. There is no rigidity, no rebound, no CVA tenderness, no tenderness at McBurney's point and negative Murphy's sign. No hernia. Hernia confirmed negative in the ventral area, confirmed negative in the right inguinal area and confirmed negative in the left inguinal area.    No masses noted in abdomen. TTP, minimal  guarding. No signs of inguinal hernia. No palpable ovary or uterus.   Musculoskeletal: Normal range of motion.        General: No tenderness, deformity or edema.  Lymphadenopathy:    She has no cervical adenopathy.  Neurological: She is alert and oriented to person, place, and time. No cranial nerve deficit.  Skin: Skin is warm and dry. No rash noted. No erythema. No pallor.  Psychiatric: She has a normal mood and affect. Her behavior is normal. Judgment and thought content normal.  Nursing note and vitals reviewed.   BP 111/71   Pulse 87   Temp 99 F (37.2 C) (Oral)   Resp 16   Ht 5' 9.29" (1.76 m)   Wt 194 lb 12.8 oz (88.4 kg)   LMP 11/30/2018   SpO2 98%   BMI 28.53 kg/m  Wt Readings from Last 3 Encounters:  12/18/18 194 lb 12.8 oz (88.4 kg)  07/14/18 189 lb (85.7 kg)  05/18/18 189 lb (85.7 kg)     There are no preventive care reminders to display for this patient.  There are no preventive care reminders to display for this patient.  Lab Results  Component Value Date   TSH 1.440 12/22/2017   Lab Results  Component Value Date   WBC 10.2 05/18/2018   HGB 13.2 05/18/2018   HCT 39.1 05/18/2018   MCV 91 05/18/2018   PLT 268 05/18/2018   Lab Results  Component Value Date   NA 138 12/22/2017   K 4.1 12/22/2017   CO2 23 12/22/2017   GLUCOSE 95 12/22/2017   BUN 13 12/22/2017   CREATININE 1.07 (H) 12/22/2017   BILITOT 0.6 12/22/2017   ALKPHOS 41 12/22/2017   AST 18 12/22/2017   ALT 12 12/22/2017   PROT 6.4 12/22/2017   ALBUMIN 4.2 12/22/2017   CALCIUM 9.2 12/22/2017   GFR 82.25 11/29/2011   Lab Results  Component Value Date   CHOL 245 (H) 12/22/2017   Lab Results  Component Value Date   HDL 51 12/22/2017   Lab Results  Component Value Date   LDLCALC 174 (H) 12/22/2017   Lab Results  Component Value Date   TRIG 99 12/22/2017   Lab Results  Component Value Date   CHOLHDL 4.8 (H) 12/22/2017   No results found for: HGBA1C    Assessment & Plan:    Problem List Items Addressed This Visit      Other   Pure hypercholesterolemia   Relevant Orders   Lipid panel    Other Visit Diagnoses    Screening for endocrine, metabolic and immunity disorder    -  Primary   Relevant Orders   CBC with Differential/Platelet   Comprehensive metabolic panel   Hemoglobin A1c   TSH   Pelvic pain       Relevant Orders   POCT urinalysis dipstick (Completed)      No orders of the defined types were placed in this encounter.   Follow-up: No follow-ups on file.   PLAN  Exam with findings as listed above.   Tenderness likely r/t constipation, at this time, pt states no bowel movement x 3 days.  Suggested adding psyllium and plenty of water to MiraLax to help regular bowels. Will continue to monitor - next step would be abdominal and pelvic U/S  Labs drawn, will follow as warranted.  Discussed in depth reason to switch off of chronic use of benzodiazepines to BuSpar, pt is in agreement. Will provide fills of both - ultimate goal is control of patient's anxiety, not denying medications. Will follow up with patient in 6-8 weeks.  Patient encouraged to call clinic with any questions, comments, or concerns.    Maximiano Coss, NP

## 2018-12-19 ENCOUNTER — Other Ambulatory Visit: Payer: Self-pay | Admitting: Registered Nurse

## 2018-12-19 DIAGNOSIS — E7800 Pure hypercholesterolemia, unspecified: Secondary | ICD-10-CM

## 2018-12-19 LAB — CBC WITH DIFFERENTIAL/PLATELET
Basophils Absolute: 0.1 10*3/uL (ref 0.0–0.2)
Basos: 1 %
EOS (ABSOLUTE): 0.7 10*3/uL — ABNORMAL HIGH (ref 0.0–0.4)
Eos: 8 %
Hematocrit: 41.5 % (ref 34.0–46.6)
Hemoglobin: 13.9 g/dL (ref 11.1–15.9)
Immature Grans (Abs): 0 10*3/uL (ref 0.0–0.1)
Immature Granulocytes: 1 %
Lymphocytes Absolute: 1.8 10*3/uL (ref 0.7–3.1)
Lymphs: 21 %
MCH: 31.2 pg (ref 26.6–33.0)
MCHC: 33.5 g/dL (ref 31.5–35.7)
MCV: 93 fL (ref 79–97)
Monocytes Absolute: 0.6 10*3/uL (ref 0.1–0.9)
Monocytes: 7 %
Neutrophils Absolute: 5.4 10*3/uL (ref 1.4–7.0)
Neutrophils: 62 %
Platelets: 282 10*3/uL (ref 150–450)
RBC: 4.45 x10E6/uL (ref 3.77–5.28)
RDW: 12.5 % (ref 11.7–15.4)
WBC: 8.6 10*3/uL (ref 3.4–10.8)

## 2018-12-19 LAB — COMPREHENSIVE METABOLIC PANEL
ALT: 15 IU/L (ref 0–32)
AST: 17 IU/L (ref 0–40)
Albumin/Globulin Ratio: 2.3 — ABNORMAL HIGH (ref 1.2–2.2)
Albumin: 4.5 g/dL (ref 3.8–4.8)
Alkaline Phosphatase: 47 IU/L (ref 39–117)
BUN/Creatinine Ratio: 14 (ref 9–23)
BUN: 13 mg/dL (ref 6–24)
Bilirubin Total: 0.4 mg/dL (ref 0.0–1.2)
CO2: 23 mmol/L (ref 20–29)
Calcium: 9.1 mg/dL (ref 8.7–10.2)
Chloride: 105 mmol/L (ref 96–106)
Creatinine, Ser: 0.9 mg/dL (ref 0.57–1.00)
GFR calc Af Amer: 89 mL/min/{1.73_m2} (ref >59)
GFR calc non Af Amer: 77 mL/min/{1.73_m2} (ref >59)
Globulin, Total: 2 g/dL (ref 1.5–4.5)
Glucose: 88 mg/dL (ref 65–99)
Potassium: 4.1 mmol/L (ref 3.5–5.2)
Sodium: 141 mmol/L (ref 134–144)
Total Protein: 6.5 g/dL (ref 6.0–8.5)

## 2018-12-19 LAB — LIPID PANEL
Chol/HDL Ratio: 3.9 ratio (ref 0.0–4.4)
Cholesterol, Total: 233 mg/dL — ABNORMAL HIGH (ref 100–199)
HDL: 59 mg/dL (ref >39)
LDL Calculated: 153 mg/dL — ABNORMAL HIGH (ref 0–99)
Triglycerides: 103 mg/dL (ref 0–149)
VLDL Cholesterol Cal: 21 mg/dL (ref 5–40)

## 2018-12-19 LAB — HEMOGLOBIN A1C
Est. average glucose Bld gHb Est-mCnc: 97 mg/dL
Hgb A1c MFr Bld: 5 % (ref 4.8–5.6)

## 2018-12-19 LAB — TSH: TSH: 2.51 u[IU]/mL (ref 0.450–4.500)

## 2018-12-19 NOTE — Progress Notes (Signed)
Pt unwilling to start statin therapy at this time - would prefer to address HLD with lifestyle modifications. We agreed to six month follow up for lipid check and med check visit - Ideally she will present 2-3 days prior to appointment to have labs drawn.  Her recent lipid panel, drawn 12/18/18, shows decreasing LDL and total cholesterol. This is encouraging for her goal of lifestyle control. Will continue to monitor.  Order for lipid panel placed, appt ideally in late January.  Kathrin Ruddy, NP

## 2018-12-19 NOTE — Progress Notes (Signed)
Good morning [name], Your lab results have returned. Overall, they're great. A mild elevation in eosinophils (EOS) is likely related to some allergies, and the elevated Albumin/Globulin Ratio is very mild, and both your albumin and globulin are within normal limits, as such, this doesn't concern me.  Great news on your lipid panel - your total cholesterol and LDL (low density lipids, the "bad cholesterol") have both come down since last year. With this in mind, I'd like to stick with our plan of doing a 6 month follow up to check on this. Keep in mind the simple diet advice: eat real (not processed) foods, not too much, and mostly plants, and I can see this numbers continuing this trend. I'll have [place] give you a call to get this scheduled - Ideally you'd come by a day or two before your appointment to get the labs drawn, so when you come for the appointment, we will have the results in hand.  Remember - at the end of the day, a lot of your cholesterol is determined by genetics, so don't be too hard on yourself regarding any lab results!  If you have any questions, feel free to call or send me a message here.  Thank you,  Kathrin Ruddy, NP

## 2019-01-09 ENCOUNTER — Other Ambulatory Visit: Payer: Self-pay | Admitting: Registered Nurse

## 2019-01-09 DIAGNOSIS — F411 Generalized anxiety disorder: Secondary | ICD-10-CM

## 2019-01-09 NOTE — Telephone Encounter (Signed)
Would you like to change rx to 90 days supply

## 2019-01-09 NOTE — Telephone Encounter (Signed)
Yes please.  Thank you  Kathrin Ruddy, NP

## 2020-02-01 ENCOUNTER — Telehealth: Payer: Self-pay | Admitting: Registered Nurse

## 2020-02-01 DIAGNOSIS — Z0000 Encounter for general adult medical examination without abnormal findings: Secondary | ICD-10-CM

## 2020-02-01 DIAGNOSIS — Z13228 Encounter for screening for other metabolic disorders: Secondary | ICD-10-CM

## 2020-02-01 NOTE — Telephone Encounter (Signed)
LVMTCB to resch the appt that she made via [place]. It was for the wrong time slot and not with her provider. Please advise.

## 2020-02-01 NOTE — Telephone Encounter (Signed)
LVMTCB again at 2:20 to have pt sch her cpe and other concerns she was wanting to be seen about.

## 2020-02-01 NOTE — Telephone Encounter (Signed)
If we could also throw in a CBC  Thank you  Kathrin Ruddy, NP

## 2020-02-01 NOTE — Telephone Encounter (Signed)
Pt has a cpe scheduled for 04/01/20 with Orland Mustard and her labs on 03/27/20. Please put lab orders in.

## 2020-02-01 NOTE — Telephone Encounter (Signed)
CBC with Diff ordered as well.

## 2020-02-01 NOTE — Telephone Encounter (Signed)
Pt has a CPE coming up an a lab draw prior.  Are the labs that I have ordered good or would you like the to add more?

## 2020-02-11 ENCOUNTER — Ambulatory Visit: Payer: BC Managed Care – PPO | Admitting: Family Medicine

## 2020-03-27 ENCOUNTER — Ambulatory Visit (INDEPENDENT_AMBULATORY_CARE_PROVIDER_SITE_OTHER): Payer: No Typology Code available for payment source | Admitting: Family Medicine

## 2020-03-27 ENCOUNTER — Other Ambulatory Visit: Payer: Self-pay

## 2020-03-27 DIAGNOSIS — Z0000 Encounter for general adult medical examination without abnormal findings: Secondary | ICD-10-CM

## 2020-03-28 LAB — CMP14+EGFR
ALT: 10 IU/L (ref 0–32)
AST: 15 IU/L (ref 0–40)
Albumin/Globulin Ratio: 2 (ref 1.2–2.2)
Albumin: 4.4 g/dL (ref 3.8–4.8)
Alkaline Phosphatase: 50 IU/L (ref 44–121)
BUN/Creatinine Ratio: 15 (ref 9–23)
BUN: 15 mg/dL (ref 6–24)
Bilirubin Total: 0.4 mg/dL (ref 0.0–1.2)
CO2: 23 mmol/L (ref 20–29)
Calcium: 9.2 mg/dL (ref 8.7–10.2)
Chloride: 105 mmol/L (ref 96–106)
Creatinine, Ser: 0.98 mg/dL (ref 0.57–1.00)
GFR calc Af Amer: 79 mL/min/{1.73_m2} (ref >59)
GFR calc non Af Amer: 69 mL/min/{1.73_m2} (ref >59)
Globulin, Total: 2.2 g/dL (ref 1.5–4.5)
Glucose: 94 mg/dL (ref 65–99)
Potassium: 4.3 mmol/L (ref 3.5–5.2)
Sodium: 142 mmol/L (ref 134–144)
Total Protein: 6.6 g/dL (ref 6.0–8.5)

## 2020-03-28 LAB — LIPID PANEL
Chol/HDL Ratio: 4.3 ratio (ref 0.0–4.4)
Cholesterol, Total: 207 mg/dL — ABNORMAL HIGH (ref 100–199)
HDL: 48 mg/dL (ref >39)
LDL Chol Calc (NIH): 148 mg/dL — ABNORMAL HIGH (ref 0–99)
Triglycerides: 61 mg/dL (ref 0–149)
VLDL Cholesterol Cal: 11 mg/dL (ref 5–40)

## 2020-03-28 LAB — CBC WITH DIFFERENTIAL/PLATELET
Basophils Absolute: 0.1 10*3/uL (ref 0.0–0.2)
Basos: 1 %
EOS (ABSOLUTE): 0.6 10*3/uL — ABNORMAL HIGH (ref 0.0–0.4)
Eos: 9 %
Hematocrit: 41.3 % (ref 34.0–46.6)
Hemoglobin: 13.9 g/dL (ref 11.1–15.9)
Immature Grans (Abs): 0 10*3/uL (ref 0.0–0.1)
Immature Granulocytes: 0 %
Lymphocytes Absolute: 1.4 10*3/uL (ref 0.7–3.1)
Lymphs: 22 %
MCH: 31.4 pg (ref 26.6–33.0)
MCHC: 33.7 g/dL (ref 31.5–35.7)
MCV: 93 fL (ref 79–97)
Monocytes Absolute: 0.5 10*3/uL (ref 0.1–0.9)
Monocytes: 8 %
Neutrophils Absolute: 3.7 10*3/uL (ref 1.4–7.0)
Neutrophils: 60 %
Platelets: 258 10*3/uL (ref 150–450)
RBC: 4.43 x10E6/uL (ref 3.77–5.28)
RDW: 11.8 % (ref 11.7–15.4)
WBC: 6.2 10*3/uL (ref 3.4–10.8)

## 2020-03-28 LAB — HEMOGLOBIN A1C
Est. average glucose Bld gHb Est-mCnc: 103 mg/dL
Hgb A1c MFr Bld: 5.2 % (ref 4.8–5.6)

## 2020-03-28 LAB — TSH: TSH: 2.47 u[IU]/mL (ref 0.450–4.500)

## 2020-04-01 ENCOUNTER — Encounter: Payer: Self-pay | Admitting: Registered Nurse

## 2020-04-01 ENCOUNTER — Ambulatory Visit (INDEPENDENT_AMBULATORY_CARE_PROVIDER_SITE_OTHER): Payer: No Typology Code available for payment source | Admitting: Registered Nurse

## 2020-04-01 ENCOUNTER — Other Ambulatory Visit: Payer: Self-pay

## 2020-04-01 VITALS — BP 111/74 | HR 70 | Temp 98.6°F | Resp 18 | Ht 69.0 in | Wt 172.6 lb

## 2020-04-01 DIAGNOSIS — Z8619 Personal history of other infectious and parasitic diseases: Secondary | ICD-10-CM | POA: Diagnosis not present

## 2020-04-01 DIAGNOSIS — Z0000 Encounter for general adult medical examination without abnormal findings: Secondary | ICD-10-CM

## 2020-04-01 DIAGNOSIS — Z23 Encounter for immunization: Secondary | ICD-10-CM

## 2020-04-01 MED ORDER — VALACYCLOVIR HCL 1 G PO TABS: 2000.0000 mg | ORAL_TABLET | Freq: Two times a day (BID) | ORAL | 2 refills | Status: DC

## 2020-04-01 NOTE — Progress Notes (Signed)
Established Patient Office Visit  Subjective:  Patient ID: Debbie Shaw, female    DOB: July 29, 1970  Age: 49 y.o. MRN: [id number]  CC:  Chief Complaint  Patient presents with  . Annual Exam    Patient states shes here for CPE and medication refill    HPI [name] presents for CPE and labs review  Histories reviewed with patient. No updates at this time  Ongoing aches and pains in knees, hips, and lower back. No new pain. Did twist her knee a few days ago - but this is much improved and getting better daily.   Routine screenings reviewed: Colonoscopy in 2018: some polyps removed. fam hx of colorectal cancer. Repeat in 2023. Pap with HPV testing: NILM HPV negative in 2018. Repeat in 2023.  Reviewed labs with patient. Lipids mildly elevated, as they have been in the past. Mildly improved. Does have strong famhx of hyperlipidemia. Otherwise labs wnl.  Needs refill on valacyclovir. Has not had cold sore in some time. Wants to have this available should one start.  No other concerns today.   Past Medical History:  Diagnosis Date  . Abnormal Pap smear of cervix    Since 2005 with biopsy under colposcopy  . Allergy   . Anxiety   . Asthma    as child  . Depression   . GERD (gastroesophageal reflux disease)   . Panic attacks   . Pure hypercholesterolemia 02/27/2018    Past Surgical History:  Procedure Laterality Date  . CHOLECYSTECTOMY  age 22   crying, upset when waking up from anesthesia  . FRACTURE SURGERY    . KNEE ARTHROSCOPY     bilateral, multiple times  . KNEE ARTHROSCOPY W/ ACL RECONSTRUCTION Left    in later 1990s  . TONSILLECTOMY      Family History  Problem Relation Age of Onset  . Colon cancer Maternal Grandfather        unknown age  . Cancer Maternal Grandfather        colon  . Heart disease Maternal Grandmother   . Cancer Maternal Grandmother   . Thyroid disease Mother   . Hyperlipidemia Mother   . Irritable bowel syndrome  Maternal Uncle        x 3  . Irritable bowel syndrome Cousin   . Esophageal cancer Neg Hx   . Rectal cancer Neg Hx   . Stomach cancer Neg Hx     Social History   Socioeconomic History  . Marital status: Married    Spouse name: Not on file  . Number of children: 3  . Years of education: Not on file  . Highest education level: Bachelor's degree (e.g., BA, AB, BS)  Occupational History  . Occupation: Teacher, early years/pre: [employer]  . Occupation: Clinical research associate: [employer]  Tobacco Use  . Smoking status: Never Smoker  . Smokeless tobacco: Never Used  Vaping Use  . Vaping Use: Never used  Substance and Sexual Activity  . Alcohol use: Yes    Comment: 3-10 sometimes all, it depends  . Drug use: No  . Sexual activity: Yes    Birth control/protection: Surgical  Other Topics Concern  . Not on file  Social History Narrative   Lives with her husband and their daughter.  They share custody of his two daughters with their mother. Education: [employer].    Social Determinants of Health   Financial Resource Strain:   .  Difficulty of Paying Living Expenses: Not on file  Food Insecurity:   . Worried About Charity fundraiser in the Last Year: Not on file  . Ran Out of Food in the Last Year: Not on file  Transportation Needs:   . Lack of Transportation (Medical): Not on file  . Lack of Transportation (Non-Medical): Not on file  Physical Activity:   . Days of Exercise per Week: Not on file  . Minutes of Exercise per Session: Not on file  Stress:   . Feeling of Stress : Not on file  Social Connections:   . Frequency of Communication with Friends and Family: Not on file  . Frequency of Social Gatherings with Friends and Family: Not on file  . Attends Religious Services: Not on file  . Active Member of Clubs or Organizations: Not on file  . Attends Archivist Meetings: Not on file  . Marital Status: Not on file  Intimate Partner Violence:   . Fear of  Current or Ex-Partner: Not on file  . Emotionally Abused: Not on file  . Physically Abused: Not on file  . Sexually Abused: Not on file    Outpatient Medications Prior to Visit  Medication Sig Dispense Refill  . ALPRAZolam (XANAX) 0.5 MG tablet Take 1 tablet (0.5 mg total) by mouth 2 (two) times daily as needed for anxiety. 30 tablet 0  . Ascorbic Acid (VITAMIN C PO) Take by mouth.    . busPIRone (BUSPAR) 10 MG tablet TAKE 1 TABLET (10 MG TOTAL) BY MOUTH 3 (THREE) TIMES DAILY. AS NEEDED FOR ANXIETY 270 tablet 1  . Calcium-Magnesium-Vitamin D (CALCIUM MAGNESIUM PO) Take by mouth daily.    . Cholecalciferol (VITAMIN D PO) Take by mouth daily.    . DiphenhydrAMINE HCl, Sleep, (ZZZQUIL PO) Take 1 tablet by mouth at bedtime.    Marland Kitchen ibuprofen (ADVIL,MOTRIN) 200 MG tablet Take 200 mg by mouth as needed.    . montelukast (SINGULAIR) 10 MG tablet TAKE 1 TABLET BY MOUTH EVERYDAY AT BEDTIME 90 tablet 1  . Multiple Vitamin (MULTIVITAMIN) tablet Take 1 tablet by mouth daily.    . polyethylene glycol powder (GLYCOLAX/MIRALAX) powder Take 17 g by mouth 2 (two) times daily as needed. 578 g 1  . Probiotic Product (PROBIOTIC DAILY PO) Take by mouth.    . valACYclovir (VALTREX) 1000 MG tablet Take 2 tablets (2,000 mg total) by mouth 2 (two) times daily. For 1 day 20 tablet 2  . cyclobenzaprine (FLEXERIL) 10 MG tablet TAKE 1 TABLET (10 MG TOTAL) BY MOUTH AT BEDTIME. FOR COSTOCHONDRITIS (Patient not taking: Reported on 04/01/2020) 30 tablet 0  . levocetirizine (XYZAL) 5 MG tablet Take 1 tablet (5 mg total) by mouth every evening. (Patient not taking: Reported on 04/01/2020) 30 tablet 11  . sertraline (ZOLOFT) 100 MG tablet Take 1 tablet (100 mg total) by mouth at bedtime. 90 tablet 3   No facility-administered medications prior to visit.    Allergies  Allergen Reactions  . Buprenorphine Hcl Nausea And Vomiting  . Morphine And Related Nausea And Vomiting  . 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 Sulfa Antibiotics Nausea And Vomiting    vomiting  . Sulfacetamide Sodium Nausea And Vomiting    ROS Review of Systems  Constitutional: Negative.   HENT: Negative.   Eyes: Negative.   Respiratory: Negative.  Cardiovascular: Negative.   Gastrointestinal: Negative.   Genitourinary: Negative.   Musculoskeletal: Negative.   Skin: Negative.   Neurological: Negative.   Psychiatric/Behavioral: Negative.       Objective:    Physical Exam Vitals and nursing note reviewed.  Constitutional:      General: She is not in acute distress.    Appearance: Normal appearance. She is normal weight. She is not ill-appearing, toxic-appearing or diaphoretic.  HENT:     Head: Normocephalic and atraumatic.     Right Ear: Tympanic membrane, ear canal and external ear normal. There is no impacted cerumen.     Left Ear: Tympanic membrane, ear canal and external ear normal. There is no impacted cerumen.     Nose: Nose normal. No congestion or rhinorrhea.     Mouth/Throat:     Mouth: Mucous membranes are moist.     Pharynx: Oropharynx is clear. No oropharyngeal exudate or posterior oropharyngeal erythema.  Eyes:     General: No scleral icterus.       Right eye: No discharge.        Left eye: No discharge.     Extraocular Movements: Extraocular movements intact.     Conjunctiva/sclera: Conjunctivae normal.     Pupils: Pupils are equal, round, and reactive to light.  Cardiovascular:     Rate and Rhythm: Normal rate and regular rhythm.     Pulses: Normal pulses.     Heart sounds: Normal heart sounds. No murmur heard.  No friction rub. No gallop.   Pulmonary:     Effort: Pulmonary effort is normal. No respiratory distress.     Breath sounds: Normal breath sounds. No stridor. No wheezing, rhonchi or rales.  Chest:     Chest wall: No tenderness.  Abdominal:     General: Abdomen is  flat. Bowel sounds are normal. There is no distension.     Palpations: Abdomen is soft. There is no mass.     Tenderness: There is no abdominal tenderness. There is no right CVA tenderness, left CVA tenderness, guarding or rebound.     Hernia: No hernia is present.  Musculoskeletal:        General: No swelling, tenderness, deformity or signs of injury. Normal range of motion.     Right lower leg: No edema.     Left lower leg: No edema.  Skin:    General: Skin is warm and dry.     Capillary Refill: Capillary refill takes less than 2 seconds.     Coloration: Skin is not jaundiced or pale.     Findings: No bruising, erythema, lesion or rash.  Neurological:     General: No focal deficit present.     Mental Status: She is alert and oriented to person, place, and time. Mental status is at baseline.     Cranial Nerves: No cranial nerve deficit.     Sensory: No sensory deficit.     Motor: No weakness.     Coordination: Coordination normal.     Gait: Gait normal.     Deep Tendon Reflexes: Reflexes normal.  Psychiatric:        Mood and Affect: Mood normal.        Behavior: Behavior normal.        Thought Content: Thought content normal.        Judgment: Judgment normal.     BP 111/74   Pulse 70   Temp 98.6 F (37 C) (Temporal)   Resp 18  Ht 5\' 9"  (1.753 m)   Wt 172 lb 9.6 oz (78.3 kg)   LMP 03/21/2020   SpO2 100%   BMI 25.49 kg/m  Wt Readings from Last 3 Encounters:  04/01/20 172 lb 9.6 oz (78.3 kg)  12/18/18 194 lb 12.8 oz (88.4 kg)  07/14/18 189 lb (85.7 kg)     There are no preventive care reminders to display for this patient.  There are no preventive care reminders to display for this patient.  Lab Results  Component Value Date   TSH 2.470 03/27/2020   Lab Results  Component Value Date   WBC 6.2 03/27/2020   HGB 13.9 03/27/2020   HCT 41.3 03/27/2020   MCV 93 03/27/2020   PLT 258 03/27/2020   Lab Results  Component Value Date   NA 142 03/27/2020   K 4.3  03/27/2020   CO2 23 03/27/2020   GLUCOSE 94 03/27/2020   BUN 15 03/27/2020   CREATININE 0.98 03/27/2020   BILITOT 0.4 03/27/2020   ALKPHOS 50 03/27/2020   AST 15 03/27/2020   ALT 10 03/27/2020   PROT 6.6 03/27/2020   ALBUMIN 4.4 03/27/2020   CALCIUM 9.2 03/27/2020   GFR 82.25 11/29/2011   Lab Results  Component Value Date   CHOL 207 (H) 03/27/2020   Lab Results  Component Value Date   HDL 48 03/27/2020   Lab Results  Component Value Date   LDLCALC 148 (H) 03/27/2020   Lab Results  Component Value Date   TRIG 61 03/27/2020   Lab Results  Component Value Date   CHOLHDL 4.3 03/27/2020   Lab Results  Component Value Date   HGBA1C 5.2 03/27/2020      Assessment & Plan:   Problem List Items Addressed This Visit      Other   H/O cold sores   Relevant Medications   valACYclovir (VALTREX) 1000 MG tablet    Other Visit Diagnoses    Flu vaccine need    -  Primary   Relevant Orders   Flu Vaccine QUAD 6+ mos PF IM (Fluarix Quad PF) (Completed)      Meds ordered this encounter  Medications  . valACYclovir (VALTREX) 1000 MG tablet    Sig: Take 2 tablets (2,000 mg total) by mouth 2 (two) times daily. For 1 day    Dispense:  20 tablet    Refill:  2    Pt does not need currently. Want to keep current rx active on file.    Follow-up: No follow-ups on file.   PLAN  Exam unremarkable  Labs reviewed - no major concerns  Keep up healthy diet and regular activity  Return annually for CPE and labs  Patient encouraged to call clinic with any questions, comments, or concerns.  Maximiano Coss, NP

## 2020-04-01 NOTE — Patient Instructions (Addendum)
   If you have lab work done today you will be contacted with your lab results within the next 2 weeks.  If you have not heard from us then please contact us. The fastest way to get your results is to register for My Chart.   IF you received an x-ray today, you will receive an invoice from [facility]. Please contact [facility] at [phone] with questions or concerns regarding your invoice.   IF you received labwork today, you will receive an invoice from [facility]. Please contact [facility] at [phone] with questions or concerns regarding your invoice.   Our billing staff will not be able to assist you with questions regarding bills from these companies.  You will be contacted with the lab results as soon as they are available. The fastest way to get your results is to activate your My Chart account. Instructions are located on the last page of this paperwork. If you have not heard from us regarding the results in 2 weeks, please contact this office.       Health Maintenance, Female Adopting a healthy lifestyle and getting preventive care are important in promoting health and wellness. Ask your health care provider about:  The right schedule for you to have regular tests and exams.  Things you can do on your own to prevent diseases and keep yourself healthy. What should I know about diet, weight, and exercise? Eat a healthy diet   Eat a diet that includes plenty of vegetables, fruits, low-fat dairy products, and lean protein.  Do not eat a lot of foods that are high in solid fats, added sugars, or sodium. Maintain a healthy weight Body mass index (BMI) is used to identify weight problems. It estimates body fat based on height and weight. Your health care provider can help determine your BMI and help you achieve or maintain a healthy weight. Get regular exercise Get regular exercise. This is one of the most important things you can do for your health. Most  adults should:  Exercise for at least 150 minutes each week. The exercise should increase your heart rate and make you sweat (moderate-intensity exercise).  Do strengthening exercises at least twice a week. This is in addition to the moderate-intensity exercise.  Spend less time sitting. Even light physical activity can be beneficial. Watch cholesterol and blood lipids Have your blood tested for lipids and cholesterol at 48 years of age, then have this test every 5 years. Have your cholesterol levels checked more often if:  Your lipid or cholesterol levels are high.  You are older than 48 years of age.  You are at high risk for heart disease. What should I know about cancer screening? Depending on your health history and family history, you may need to have cancer screening at various ages. This may include screening for:  Breast cancer.  Cervical cancer.  Colorectal cancer.  Skin cancer.  Lung cancer. What should I know about heart disease, diabetes, and high blood pressure? Blood pressure and heart disease  High blood pressure causes heart disease and increases the risk of stroke. This is more likely to develop in people who have high blood pressure readings, are of African descent, or are overweight.  Have your blood pressure checked: ? Every 3-5 years if you are 18-39 years of age. ? Every year if you are 40 years old or older. Diabetes Have regular diabetes screenings. This checks your fasting blood sugar level. Have the screening done:  Once   every three years after age 40 if you are at a normal weight and have a low risk for diabetes.  More often and at a younger age if you are overweight or have a high risk for diabetes. What should I know about preventing infection? Hepatitis B If you have a higher risk for hepatitis B, you should be screened for this virus. Talk with your health care provider to find out if you are at risk for hepatitis B infection. Hepatitis  C Testing is recommended for:  Everyone born from 1945 through 1965.  Anyone with known risk factors for hepatitis C. Sexually transmitted infections (STIs)  Get screened for STIs, including gonorrhea and chlamydia, if: ? You are sexually active and are younger than 48 years of age. ? You are older than 48 years of age and your health care provider tells you that you are at risk for this type of infection. ? Your sexual activity has changed since you were last screened, and you are at increased risk for chlamydia or gonorrhea. Ask your health care provider if you are at risk.  Ask your health care provider about whether you are at high risk for HIV. Your health care provider may recommend a prescription medicine to help prevent HIV infection. If you choose to take medicine to prevent HIV, you should first get tested for HIV. You should then be tested every 3 months for as long as you are taking the medicine. Pregnancy  If you are about to stop having your period (premenopausal) and you may become pregnant, seek counseling before you get pregnant.  Take 400 to 800 micrograms (mcg) of folic acid every day if you become pregnant.  Ask for birth control (contraception) if you want to prevent pregnancy. Osteoporosis and menopause Osteoporosis is a disease in which the bones lose minerals and strength with aging. This can result in bone fractures. If you are 65 years old or older, or if you are at risk for osteoporosis and fractures, ask your health care provider if you should:  Be screened for bone loss.  Take a calcium or vitamin D supplement to lower your risk of fractures.  Be given hormone replacement therapy (HRT) to treat symptoms of menopause. Follow these instructions at home: Lifestyle  Do not use any products that contain nicotine or tobacco, such as cigarettes, e-cigarettes, and chewing tobacco. If you need help quitting, ask your health care provider.  Do not use street  drugs.  Do not share needles.  Ask your health care provider for help if you need support or information about quitting drugs. Alcohol use  Do not drink alcohol if: ? Your health care provider tells you not to drink. ? You are pregnant, may be pregnant, or are planning to become pregnant.  If you drink alcohol: ? Limit how much you use to 0-1 drink a day. ? Limit intake if you are breastfeeding.  Be aware of how much alcohol is in your drink. In the [place] one drink equals one 12 oz bottle of beer (355 mL), one 5 oz glass of wine (148 mL), or one 1 oz glass of hard liquor (44 mL). General instructions  Schedule regular health, dental, and eye exams.  Stay current with your vaccines.  Tell your health care provider if: ? You often feel depressed. ? You have ever been abused or do not feel safe at home. Summary  Adopting a healthy lifestyle and getting preventive care are important in promoting health and   wellness.  Follow your health care provider's instructions about healthy diet, exercising, and getting tested or screened for diseases.  Follow your health care provider's instructions on monitoring your cholesterol and blood pressure. This information is not intended to replace advice given to you by your health care provider. Make sure you discuss any questions you have with your health care provider. Document Revised: 05/03/2018 Document Reviewed: 05/03/2018 Elsevier Patient Education  2020 [employer].     Why follow it? Research shows. . Those who follow the Mediterranean diet have a reduced risk of heart disease  . The diet is associated with a reduced incidence of Parkinson's and Alzheimer's diseases . People following the diet may have longer life expectancies and lower rates of chronic diseases  . The Dietary Guidelines for Americans recommends the Mediterranean diet as an eating plan to promote health and prevent disease  What Is the Mediterranean Diet?   . Healthy eating plan based on typical foods and recipes of Mediterranean-style cooking . The diet is primarily a plant based diet; these foods should make up a majority of meals   Starches - Plant based foods should make up a majority of meals - They are an important sources of vitamins, minerals, energy, antioxidants, and fiber - Choose whole grains, foods high in fiber and minimally processed items  - Typical grain sources include wheat, oats, barley, corn, brown rice, bulgar, farro, millet, polenta, couscous  - Various types of beans include chickpeas, lentils, fava beans, black beans, white beans   Fruits  Veggies - Large quantities of antioxidant rich fruits & veggies; 6 or more servings  - Vegetables can be eaten raw or lightly drizzled with oil and cooked  - Vegetables common to the traditional Mediterranean Diet include: artichokes, arugula, beets, broccoli, brussel sprouts, cabbage, carrots, celery, collard greens, cucumbers, eggplant, kale, leeks, lemons, lettuce, mushrooms, okra, onions, peas, peppers, potatoes, pumpkin, radishes, rutabaga, shallots, spinach, sweet potatoes, turnips, zucchini - Fruits common to the Mediterranean Diet include: apples, apricots, avocados, cherries, clementines, dates, figs, grapefruits, grapes, melons, nectarines, oranges, peaches, pears, pomegranates, strawberries, tangerines  Fats - Replace butter and margarine with healthy oils, such as olive oil, canola oil, and tahini  - Limit nuts to no more than a handful a day  - Nuts include walnuts, almonds, pecans, pistachios, pine nuts  - Limit or avoid candied, honey roasted or heavily salted nuts - Olives are central to the [employer] - can be eaten whole or used in a variety of dishes   Meats Protein - Limiting red meat: no more than a few times a month - When eating red meat: choose lean cuts and keep the portion to the size of deck of cards - Eggs: approx. 0 to 4 times a week  - Fish and lean  poultry: at least 2 a week  - Healthy protein sources include, chicken, [place], lean beef, lamb - Increase intake of seafood such as tuna, salmon, trout, mackerel, shrimp, scallops - Avoid or limit high fat processed meats such as sausage and bacon  Dairy - Include moderate amounts of low fat dairy products  - Focus on healthy dairy such as fat free yogurt, skim milk, low or reduced fat cheese - Limit dairy products higher in fat such as whole or 2% milk, cheese, ice cream  Alcohol - Moderate amounts of red wine is ok  - No more than 5 oz daily for women (all ages) and men older than age 51  - No  more than 10 oz of wine daily for men younger than 18  Other - Limit sweets and other desserts  - Use herbs and spices instead of salt to flavor foods  - Herbs and spices common to the traditional Mediterranean Diet include: basil, bay leaves, chives, cloves, cumin, fennel, garlic, lavender, marjoram, mint, oregano, parsley, pepper, rosemary, sage, savory, sumac, tarragon, thyme   It's not just a diet, it's a lifestyle:  . The Mediterranean diet includes lifestyle factors typical of those in the region  . Foods, drinks and meals are best eaten with others and savored . Daily physical activity is important for overall good health . This could be strenuous exercise like running and aerobics . This could also be more leisurely activities such as walking, housework, yard-work, or taking the stairs . Moderation is the key; a balanced and healthy diet accommodates most foods and drinks . Consider portion sizes and frequency of consumption of certain foods   Meal Ideas & Options:  . Breakfast:  o Whole wheat toast or whole wheat English muffins with peanut butter & hard boiled egg o Steel cut oats topped with apples & cinnamon and skim milk  o Fresh fruit: banana, strawberries, melon, berries, peaches  o Smoothies: strawberries, bananas, greek yogurt, peanut butter o Low fat greek yogurt with  blueberries and granola  o Egg white omelet with spinach and mushrooms o Breakfast couscous: whole wheat couscous, apricots, skim milk, cranberries  . Sandwiches:  o Hummus and grilled vegetables (peppers, zucchini, squash) on whole wheat bread   o Grilled chicken on whole wheat pita with lettuce, tomatoes, cucumbers or tzatziki  o Tuna salad on whole wheat bread: tuna salad made with greek yogurt, olives, red peppers, capers, green onions o Garlic rosemary lamb pita: lamb sauted with garlic, rosemary, salt & pepper; add lettuce, cucumber, greek yogurt to pita - flavor with lemon juice and black pepper  . Seafood:  o Mediterranean grilled salmon, seasoned with garlic, basil, parsley, lemon juice and black pepper o Shrimp, lemon, and spinach whole-grain pasta salad made with low fat greek yogurt  o Seared scallops with lemon orzo  o Seared tuna steaks seasoned salt, pepper, coriander topped with tomato mixture of olives, tomatoes, olive oil, minced garlic, parsley, green onions and cappers  . Meats:  o Herbed greek chicken salad with kalamata olives, cucumber, feta  o Red bell peppers stuffed with spinach, bulgur, lean ground beef (or lentils) & topped with feta   o Kebabs: skewers of chicken, tomatoes, onions, zucchini, squash  o [place] burgers: made with red onions, mint, dill, lemon juice, feta cheese topped with roasted red peppers . Vegetarian o Cucumber salad: cucumbers, artichoke hearts, celery, red onion, feta cheese, tossed in olive oil & lemon juice  o Hummus and whole grain pita points with a greek salad (lettuce, tomato, feta, olives, cucumbers, red onion) o Lentil soup with celery, carrots made with vegetable broth, garlic, salt and pepper  o Tabouli salad: parsley, bulgur, mint, scallions, cucumbers, tomato, radishes, lemon juice, olive oil, salt and pepper.       Fat and Cholesterol Restricted Eating Plan Eating a diet that limits fat and cholesterol may help lower your risk  for heart disease and other conditions. Your body needs fat and cholesterol for basic functions, but eating too much of these things can be harmful to your health. Your health care provider may order lab tests to check your blood fat (lipid) and cholesterol levels. This helps your health  care provider understand your risk for certain conditions and whether you need to make diet changes. Work with your health care provider or dietitian to make an eating plan that is right for you. Your plan includes:  Limit your fat intake to ______% or less of your total calories a day.  Limit your saturated fat intake to ______% or less of your total calories a day.  Limit the amount of cholesterol in your diet to less than _________mg a day.  Eat ___________ g of fiber a day. What are tips for following this plan? General guidelines   If you are overweight, work with your health care provider to lose weight safely. Losing just 5-10% of your body weight can improve your overall health and help prevent diseases such as diabetes and heart disease.  Avoid: ? Foods with added sugar. ? Fried foods. ? Foods that contain partially hydrogenated oils, including stick margarine, some tub margarines, cookies, crackers, and other baked goods.  Limit alcohol intake to no more than 1 drink a day for nonpregnant women and 2 drinks a day for men. One drink equals 12 oz of beer, 5 oz of wine, or 1 oz of hard liquor. Reading food labels  Check food labels for: ? Trans fats, partially hydrogenated oils, or high amounts of saturated fat. Avoid foods that contain saturated fat and trans fat. ? The amount of cholesterol in each serving. Try to eat no more than 200 mg of cholesterol each day. ? The amount of fiber in each serving. Try to eat at least 20-30 g of fiber each day.  Choose foods with healthy fats, such as: ? Monounsaturated and polyunsaturated fats. These include olive and canola oil, flaxseeds, walnuts,  almonds, and seeds. ? Omega-3 fats. These are found in foods such as salmon, mackerel, sardines, tuna, flaxseed oil, and ground flaxseeds.  Choose grain products that have whole grains. Look for the word "whole" as the first word in the ingredient list. Cooking  Cook foods using methods other than frying. Baking, boiling, grilling, and broiling are some healthy options.  Eat more home-cooked food and less restaurant, buffet, and fast food.  Avoid cooking using saturated fats. ? Animal sources of saturated fats include meats, butter, and cream. ? Plant sources of saturated fats include palm oil, palm kernel oil, and coconut oil. Meal planning   At meals, imagine dividing your plate into fourths: ? Fill one-half of your plate with vegetables and green salads. ? Fill one-fourth of your plate with whole grains. ? Fill one-fourth of your plate with lean protein foods.  Eat fish that is high in omega-3 fats at least two times a week.  Eat more foods that contain fiber, such as whole grains, beans, apples, broccoli, carrots, peas, and barley. These foods help promote healthy cholesterol levels in the blood. Recommended foods Grains  Whole grains, such as whole wheat or whole grain breads, crackers, cereals, and pasta. Unsweetened oatmeal, bulgur, barley, quinoa, or brown rice. Corn or whole wheat flour tortillas. Vegetables  Fresh or frozen vegetables (raw, steamed, roasted, or grilled). Green salads. Fruits  All fresh, canned (in natural juice), or frozen fruits. Meats and other protein foods  Ground beef (85% or leaner), grass-fed beef, or beef trimmed of fat. Skinless chicken or [place]. Ground chicken or [place]. Pork trimmed of fat. All fish and seafood. Egg whites. Dried beans, peas, or lentils. Unsalted nuts or seeds. Unsalted canned beans. Natural nut butters without added sugar and oil. Dairy  Low-fat or nonfat dairy products, such as skim or 1% milk, 2% or reduced-fat cheeses,  low-fat and fat-free ricotta or cottage cheese, or plain low-fat and nonfat yogurt. Fats and oils  Tub margarine without trans fats. Light or reduced-fat mayonnaise and salad dressings. Avocado. Olive, canola, sesame, or safflower oils. The items listed above may not be a complete list of recommended foods or beverages. Contact your dietitian for more options. Foods to avoid Grains  White bread. White pasta. White rice. Cornbread. Bagels, pastries, and croissants. Crackers and snack foods that contain trans fat and hydrogenated oils. Vegetables  Vegetables cooked in cheese, cream, or butter sauce. Fried vegetables. Fruits  Canned fruit in heavy syrup. Fruit in cream or butter sauce. Fried fruit. Meats and other protein foods  Fatty cuts of meat. Ribs, chicken wings, bacon, sausage, bologna, salami, chitterlings, fatback, hot dogs, bratwurst, and packaged lunch meats. Liver and organ meats. Whole eggs and egg yolks. Chicken and [place] with skin. Fried meat. Dairy  Whole or 2% milk, cream, half-and-half, and cream cheese. Whole milk cheeses. Whole-fat or sweetened yogurt. Full-fat cheeses. Nondairy creamers and whipped toppings. Processed cheese, cheese spreads, and cheese curds. Beverages  Alcohol. Sugar-sweetened drinks such as sodas, lemonade, and fruit drinks. Fats and oils  Butter, stick margarine, lard, shortening, ghee, or bacon fat. Coconut, palm kernel, and palm oils. Sweets and desserts  Corn syrup, sugars, honey, and molasses. Candy. Jam and jelly. Syrup. Sweetened cereals. Cookies, pies, cakes, donuts, muffins, and ice cream. The items listed above may not be a complete list of foods and beverages to avoid. Contact your dietitian for more information. Summary  Your body needs fat and cholesterol for basic functions. However, eating too much of these things can be harmful to your health.  Work with your health care provider and dietitian to follow a diet low in fat and  cholesterol. Doing this may help lower your risk for heart disease and other conditions.  Choose healthy fats, such as monounsaturated and polyunsaturated fats, and foods high in omega-3 fatty acids.  Eat fiber-rich foods, such as whole grains, beans, peas, fruits, and vegetables.  Limit or avoid alcohol, fried foods, and foods high in saturated fats, partially hydrogenated oils, and sugar. This information is not intended to replace advice given to you by your health care provider. Make sure you discuss any questions you have with your health care provider. Document Revised: 04/22/2017 Document Reviewed: 01/25/2017 Elsevier Patient Education  [facility]) Exercise Recommendation  Being physically active is important to prevent heart disease and stroke, the nation's No. 1and No. 5killers. To improve overall cardiovascular health, we suggest at least 150 minutes per week of moderate exercise or 75 minutes per week of vigorous exercise (or a combination of moderate and vigorous activity). Thirty minutes a day, five times a week is an easy goal to remember. You will also experience benefits even if you divide your time into two or three segments of 10 to 15 minutes per day.  For people who would benefit from lowering their blood pressure or cholesterol, we recommend 40 minutes of aerobic exercise of moderate to vigorous intensity three to four times a week to lower the risk for heart attack and stroke.  Physical activity is anything that makes you move your body and burn calories.  This includes things like climbing stairs or playing sports. Aerobic exercises benefit your heart, and include walking, jogging, swimming or biking. Strength and stretching exercises are  best for overall stamina and flexibility.  The simplest, positive change you can make to effectively improve your heart health is to start walking. It's enjoyable, free, easy, social and great  exercise. A walking program is flexible and boasts high success rates because people can stick with it. It's easy for walking to become a regular and satisfying part of life.   For Overall Cardiovascular Health:  At least 30 minutes of moderate-intensity aerobic activity at least 5 days per week for a total of 150  OR   At least 25 minutes of vigorous aerobic activity at least 3 days per week for a total of 75 minutes; or a combination of moderate- and vigorous-intensity aerobic activity  AND   Moderate- to high-intensity muscle-strengthening activity at least 2 days per week for additional health benefits.  For Lowering Blood Pressure and Cholesterol  An average 40 minutes of moderate- to vigorous-intensity aerobic activity 3 or 4 times per week  What if I can't make it to the time goal? Something is always better than nothing! And everyone has to start somewhere. Even if you've been sedentary for years, today is the day you can begin to make healthy changes in your life. If you don't think you'll make it for 30 or 40 minutes, set a reachable goal for today. You can work up toward your overall goal by increasing your time as you get stronger. Don't let all-or-nothing thinking rob you of doing what you can every day.  Source:http://www.heart.org

## 2020-06-04 ENCOUNTER — Telehealth: Payer: Self-pay | Admitting: Registered Nurse

## 2020-06-04 NOTE — Telephone Encounter (Addendum)
06/04/2020 - PATIENT HAD HER PHYSICAL DONE WITH RICH MORROW ON 04/01/20 AND HAD LABS DONE. SHE WAS LOOKING AT HER REPORT TODAY AND NOTICED SHE HAD SOME THINGS ELEVATED. SHE WOULD LIKE SOMEONE TO GO OVER THE RESULTS WITH HER. SHE ALSO WANTS TO KNOW WHAT SHE CAN DO TO LOWER HER CHOLESTEROL WITHOUT TAKING MEDICATION? BEST PHONE: [phone] (CELL) [place]

## 2020-06-05 NOTE — Telephone Encounter (Signed)
Pt is wanting more information about her labs. I did see that you were going to give her more information a the next office visit except it doesn't look like she has one scheduled. Do you want to talk to her in person at an appointment?

## 2020-06-06 ENCOUNTER — Other Ambulatory Visit: Payer: Self-pay | Admitting: Registered Nurse

## 2020-06-06 DIAGNOSIS — R922 Inconclusive mammogram: Secondary | ICD-10-CM

## 2020-06-06 NOTE — Progress Notes (Signed)
Orders placed for follow up imaging for inconclusive mammogram

## 2020-06-13 ENCOUNTER — Encounter: Payer: Self-pay | Admitting: Registered Nurse

## 2020-06-17 LAB — HM MAMMOGRAPHY

## 2021-05-01 ENCOUNTER — Ambulatory Visit: Payer: No Typology Code available for payment source | Attending: Orthopedic Surgery

## 2021-05-01 ENCOUNTER — Other Ambulatory Visit: Payer: Self-pay

## 2021-05-01 DIAGNOSIS — M545 Low back pain, unspecified: Secondary | ICD-10-CM | POA: Insufficient documentation

## 2021-05-01 DIAGNOSIS — M6281 Muscle weakness (generalized): Secondary | ICD-10-CM | POA: Diagnosis present

## 2021-05-01 DIAGNOSIS — R262 Difficulty in walking, not elsewhere classified: Secondary | ICD-10-CM | POA: Insufficient documentation

## 2021-05-01 NOTE — Therapy (Signed)
[facility] @ [place], [place], [address] Phone: [phone]   Fax:  [phone]  Physical Therapy Evaluation  Patient Details  Name: Debbie Shaw MRN: [id number] Date of Birth: 1970-07-14 Referring Provider (PT): Nelson Chimes, [place]   Encounter Date: 05/01/2021   PT End of Session - 05/01/21 2047     Visit Number 1    PT Start Time 0800    PT Stop Time 6962    PT Time Calculation (min) 42 min    Activity Tolerance Patient tolerated treatment well    Behavior During Therapy [facility] for tasks assessed/performed             Past Medical History:  Diagnosis Date   Abnormal Pap smear of cervix    Since 2005 with biopsy under colposcopy   Allergy    Anxiety    Asthma    as child   Depression    GERD (gastroesophageal reflux disease)    Panic attacks    Pure hypercholesterolemia 02/27/2018    Past Surgical History:  Procedure Laterality Date   CHOLECYSTECTOMY  age 22   crying, upset when waking up from anesthesia   FRACTURE SURGERY     KNEE ARTHROSCOPY     bilateral, multiple times   KNEE ARTHROSCOPY W/ ACL RECONSTRUCTION Left    in later 1990s   TONSILLECTOMY      There were no vitals filed for this visit.    Subjective Assessment - 05/01/21 0810     Subjective Patient states she has left low back pain since Saturday after Thanksgiving.  She explains the pain was so severe that she could not get up and move for several days.  She only recalls sitting a lot in the car and an incident where her brother in law had a seizure and she had to hold the dog back while tending to him.  She does recall a history of one other incident several years ago while she was at the beach and bent over to pick something up in the sand and her "back went out".  She was able to ice and stretch and resolved the pain.  She hopes to gain control of her pain and avoid recurrance.    Limitations Sitting;Lifting;Standing     How long can you sit comfortably? 30 min    How long can you stand comfortably? 15 min    How long can you walk comfortably? unlimited    Diagnostic tests xrays: DDD    Patient Stated Goals To resolve pain and be able to do routine daily activities without being so careful    Currently in Pain? No/denies   Pain intermittant   Pain Score 5     Pain Location Back    Pain Orientation Left    Pain Descriptors / Indicators Aching;Discomfort    Pain Type Acute pain    Pain Onset 1 to 4 weeks ago    Pain Frequency Intermittent    Aggravating Factors  standing, bending , stooping and squatting    Pain Relieving Factors ice and meds                [facility] PT Assessment - 05/01/21 0001       Assessment   Medical Diagnosis Saccrococcygeal pain    Referring Provider (PT) Nelson Chimes, PA    Onset Date/Surgical Date 04/25/21    Hand Dominance Right    Next MD Visit as needed  Prior Therapy yes at [facility] ortho      Precautions   Precautions None      Balance Screen   Has the patient fallen in the past 6 months No    Has the patient had a decrease in activity level because of a fear of falling?  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 area    Lumbar - Right Side Bend fingertips to knee joint line    Lumbar - Left Side Bend  fingertips to knee joint line    Lumbar - Right Rotation WNL    Lumbar - Left Rotation WNL      Strength   Overall Strength Within functional limits for tasks performed;Deficits    Overall Strength Comments Left hip flexion, abduction and extension all 4/5 with discomfort      Flexibility   Soft Tissue Assessment /Muscle Length yes    Hamstrings to approx 60 degrees on right and 50 degrees on left    Quadriceps Left > Right    ITB WNL    Piriformis WNL      Palpation   SI assessment  Tender along left low back and S.I. area    Palpation comment Left PSIS more prominent than right      Transfers   Transfers Sit to Stand    Sit to Stand 7: Independent                        Objective measurements completed on examination: See above findings.                PT Education - 05/01/21 0841     Education Details Access Code: [email or web address]  Educated on how left heel pain and abnormal gait could be contributing to patients low back pain.    Person(s) Educated Patient    Methods Explanation;Demonstration;Verbal cues;Handout    Comprehension Verbalized understanding;Returned demonstration;Verbal cues required              PT Short Term Goals - 05/01/21 0930       PT SHORT TERM GOAL #1   Title Independence with initial HEP    Time 4    Period Weeks    Status New    Target Date 05/29/21      PT SHORT TERM GOAL #2   Title Pain no greater than 4/10    Time 4    Period Weeks    Status New    Target Date 05/29/21               PT Long Term Goals - 05/01/21 7893  PT LONG TERM GOAL #1   Title Independence with advanced HEP    Time 8    Period Weeks    Status New    Target Date 06/26/21      PT LONG TERM GOAL #2   Title Patient to be able to bend stoop and squat without pain    Time 8    Period Weeks    Status New    Target Date 06/26/21      PT LONG TERM GOAL #3   Title Patient to be able to do routine household chores without  pain    Time 8    Period Weeks    Status New    Target Date 06/26/21      PT LONG TERM GOAL #4   Title Patient to be able to ride in a car for longer trips    Time 8    Period Weeks    Status New    Target Date 06/26/21                    Plan - 05/01/21 2051     Clinical Impression Statement Patient is a 50 y.o. female referred by Nelson Chimes, PA for possible sacrococcygeal pain.  She presents with left low back pain which appears to be referred into the left S.I. area.  She is hypermobile in lumbar flexion but hamstrings are limited to approx 60 degrees bilaterally in supine.  Strength is compromised at left hip flexion and resistance testing causes pain.  She has normal lumbar ROM otherwise.  Sensation is intact.  Left PSIS is more prominent indicating pelvic assymetry.  Her symptoms are consistent with degenerative disc disease.  She would benefit from skilled PT for LE flexibility and core strengthening.  She hopes to eliminate pain and avoid recurrance.    Personal Factors and Comorbidities Past/Current Experience    Examination-Activity Limitations Lift;Bend;Squat    Examination-Participation Restrictions Cleaning;Community Activity;Yard Work    Stability/Clinical Decision Making Stable/Uncomplicated    Designer, jewellery Low    Rehab Potential Excellent    PT Frequency 2x / week    PT Duration 8 weeks    PT Treatment/Interventions ADLs/Self Care Home Management;Aquatic Therapy;Traction;Moist Heat;Iontophoresis 4mg /ml Dexamethasone;Electrical Stimulation;Cryotherapy;Ultrasound;Gait training;DME Instruction;Therapeutic exercise;Therapeutic activities;Functional mobility training;Stair training;Balance training;Neuromuscular re-education;Patient/family education;Manual techniques;Passive range of motion;Vestibular;Vasopneumatic Device;Taping;Dry needling;Joint Manipulations;Spinal Manipulations    PT Next Visit Plan Review HEP.  Initiate core strengthening. Complete  FOTO    PT Home Exercise Plan Access Code: [email or web address]  URL: [email or web address]  Date: 05/01/2021  Prepared by: Candyce Churn     Exercises  Standing Hamstring Stretch on Chair - 2 x daily - 7 x weekly - 1 sets - 3 reps - 30 sec hold  Standing Quad Stretch with Table and Chair Support - 2 x daily - 7 x weekly - 1 sets - 3 reps - 30 sec hold    Consulted and Agree with Plan of Care Patient             Patient will benefit from skilled therapeutic intervention in order to improve the following deficits and impairments:  Abnormal gait, Decreased balance, Decreased endurance, Decreased mobility, Difficulty walking, Increased muscle spasms, Improper body mechanics, Decreased strength, Impaired flexibility, Pain, Postural dysfunction  Visit Diagnosis: Acute left-sided low back pain without sciatica - Plan: PT plan of care cert/re-cert  Muscle weakness (generalized) - Plan: PT plan of care cert/re-cert  Difficulty in walking, not elsewhere classified - Plan: PT plan  of care cert/re-cert     Problem List Patient Active Problem List   Diagnosis Date Noted   Costochondritis 05/20/2018   Environmental and seasonal allergies 02/27/2018   H/O cold sores 02/27/2018   Pure hypercholesterolemia 02/27/2018   New onset headache 02/27/2018   Oral herpes simplex infection 01/03/2015   Anxiety 01/30/2013   FIBROCYSTIC BREAST DISEASE 02/10/2009   FATIGUE 02/10/2009    Anderson Malta B. Tonita Bills, PT 12/09/229:21 PM   [facility] @ [place], [place], [address] Phone: [phone]   Fax:  [phone]  Name: Debbie Shaw MRN: [id number] Date of Birth: 03-Oct-1970

## 2021-05-01 NOTE — Patient Instructions (Signed)
Access Code: [email or web address] URL: [email or web address] Date: 05/01/2021 Prepared by: Candyce Churn  Exercises Standing Hamstring Stretch on Chair - 2 x daily - 7 x weekly - 1 sets - 3 reps - 30 sec hold Standing Quad Stretch with Table and Chair Support - 2 x daily - 7 x weekly - 1 sets - 3 reps - 30 sec hold

## 2021-05-05 ENCOUNTER — Encounter: Payer: Self-pay | Admitting: Physical Therapy

## 2021-05-05 ENCOUNTER — Ambulatory Visit: Payer: No Typology Code available for payment source | Admitting: Physical Therapy

## 2021-05-05 ENCOUNTER — Other Ambulatory Visit: Payer: Self-pay

## 2021-05-05 DIAGNOSIS — M545 Low back pain, unspecified: Secondary | ICD-10-CM

## 2021-05-05 DIAGNOSIS — R262 Difficulty in walking, not elsewhere classified: Secondary | ICD-10-CM

## 2021-05-05 DIAGNOSIS — M6281 Muscle weakness (generalized): Secondary | ICD-10-CM

## 2021-05-05 NOTE — Therapy (Signed)
[facility] @ [place], [place], [address] Phone: [phone]   Fax:  [phone]  Physical Therapy Treatment  Patient Details  Name: Debbie Shaw MRN: [id number] Date of Birth: 04-Sep-1970 Referring Provider (PT): Nelson Chimes, [place]   Encounter Date: 05/05/2021   PT End of Session - 05/05/21 0843     Visit Number 2    PT Start Time 0803    PT Stop Time 4481    PT Time Calculation (min) 41 min    Activity Tolerance Patient tolerated treatment well    Behavior During Therapy [facility] for tasks assessed/performed             Past Medical History:  Diagnosis Date   Abnormal Pap smear of cervix    Since 2005 with biopsy under colposcopy   Allergy    Anxiety    Asthma    as child   Depression    GERD (gastroesophageal reflux disease)    Panic attacks    Pure hypercholesterolemia 02/27/2018    Past Surgical History:  Procedure Laterality Date   CHOLECYSTECTOMY  age 6   crying, upset when waking up from anesthesia   FRACTURE SURGERY     KNEE ARTHROSCOPY     bilateral, multiple times   KNEE ARTHROSCOPY W/ ACL RECONSTRUCTION Left    in later 1990s   TONSILLECTOMY      There were no vitals filed for this visit.   Subjective Assessment - 05/05/21 0805     Subjective I am grumpy about my pain. I woke up a few times in the night and this morning and Lt SI area hurts.  Low grade but frustrating.    Limitations Sitting;Lifting;Standing    How long can you sit comfortably? 30 min    How long can you stand comfortably? 15 min    How long can you walk comfortably? unlimited    Diagnostic tests xrays: DDD    Patient Stated Goals To resolve pain and be able to do routine daily activities without being so careful    Currently in Pain? Yes    Pain Score 1     Pain Location Pelvis    Pain Descriptors / Indicators Aching;Dull    Pain Type Acute pain    Pain Onset 1 to 4 weeks ago    Pain Frequency  Intermittent    Aggravating Factors  standing, bend, stoop, squat                [facility] PT Assessment - 05/05/21 0001       Palpation   SI assessment  Lt upslip with anterior rotation                           [facility] Adult PT Treatment/Exercise - 05/05/21 0001       Exercises   Exercises Lumbar;Knee/Hip      Lumbar Exercises: Stretches   Active Hamstring Stretch Left;2 reps;20 seconds;Right      Knee/Hip Exercises: Stretches   Hip Flexor Stretch Left;20 seconds    Hip Flexor Stretch Limitations foot on 2nd step      Knee/Hip Exercises: Standing   Functional Squat 2 sets;5 reps    Functional Squat Limitations with blue band around knees, 1/2 squat toward chair, TA indraw to couple core with hip abd      Knee/Hip Exercises: Supine   Bridges Strengthening;2 sets;5 reps    [employer]  Limitations 2nd set with blue clam with bridge, helped pain with bridge      Manual Therapy   Manual Therapy Muscle Energy Technique;Joint mobilization    Joint Mobilization prone long axis traction manip Gr V Lt    Muscle Energy Technique to correct anterior rot on Lt innominant                       PT Short Term Goals - 05/01/21 0930       PT SHORT TERM GOAL #1   Title Independence with initial HEP    Time 4    Period Weeks    Status New    Target Date 05/29/21      PT SHORT TERM GOAL #2   Title Pain no greater than 4/10    Time 4    Period Weeks    Status New    Target Date 05/29/21               PT Long Term Goals - 05/01/21 0931       PT LONG TERM GOAL #1   Title Independence with advanced HEP    Time 8    Period Weeks    Status New    Target Date 06/26/21      PT LONG TERM GOAL #2   Title Patient to be able to bend stoop and squat without pain    Time 8    Period Weeks    Status New    Target Date 06/26/21      PT LONG TERM GOAL #3   Title Patient to be able to do routine household chores without pain    Time 8    Period  Weeks    Status New    Target Date 06/26/21      PT LONG TERM GOAL #4   Title Patient to be able to ride in a car for longer trips    Time 8    Period Weeks    Status New    Target Date 06/26/21                   Plan - 05/05/21 0844     Clinical Impression Statement Pt presents with Lt upslip with anterior rotation with weakness in Lt gluteals in extensors and abductors.  Pelvis leveled out into symmetrical alignment and mobility with traction manip and MET to re-align and balance pelvis.  Pt noted improved pain with neutral spine bridge (pain with articulating bridge) with addition of hip abd band.  PT added bridge with band and squat to chair with band to HEP as well as Lt hip flexor stretch due to holding pattern of ASIS forward secondary to tight Lt anterior hip and thigh.  Continue along POC.    PT Next Visit Plan Review HEP.  check SI/pelvis alignment and continue core and hip abd/ext strength    PT Home Exercise Plan Access Code: [email or web address]    Consulted and Agree with Plan of Care Patient             Patient will benefit from skilled therapeutic intervention in order to improve the following deficits and impairments:     Visit Diagnosis: Acute left-sided low back pain without sciatica  Muscle weakness (generalized)  Difficulty in walking, not elsewhere classified     Problem List Patient Active Problem List   Diagnosis Date Noted   Costochondritis 05/20/2018   Environmental and seasonal allergies 02/27/2018  H/O cold sores 02/27/2018   Pure hypercholesterolemia 02/27/2018   New onset headache 02/27/2018   Oral herpes simplex infection 01/03/2015   Anxiety 01/30/2013   FIBROCYSTIC BREAST DISEASE 02/10/2009   FATIGUE 02/10/2009    Baruch Merl, PT 05/05/21 8:47 AM   [facility] @ [place], [place], [address] Phone: [phone]   Fax:  [phone]  Name: Debbie Shaw MRN: [id number] Date of Birth: 1970-11-25

## 2021-05-05 NOTE — Patient Instructions (Signed)
Access Code: [email or web address] URL: [email or web address] Date: 05/05/2021 Prepared by: Venetia Night Mary-Anne Polizzi  Exercises Standing Hamstring Stretch on Chair - 2 x daily - 7 x weekly - 1 sets - 3 reps - 30 sec hold Standing Quad Stretch with Table and Chair Support - 2 x daily - 7 x weekly - 1 sets - 3 reps - 30 sec hold Hip Flexor Stretch with Chair - 2 x daily - 7 x weekly - 1 sets - 2 reps - 30 hold Supine Bridge with Resistance Band - 2 x daily - 7 x weekly - 3 sets - 5 reps Squat with Chair Touch and Resistance Loop - 1 x daily - 7 x weekly - 3 sets - 5 reps

## 2021-05-07 ENCOUNTER — Ambulatory Visit: Payer: No Typology Code available for payment source

## 2021-05-07 ENCOUNTER — Other Ambulatory Visit: Payer: Self-pay

## 2021-05-07 DIAGNOSIS — R262 Difficulty in walking, not elsewhere classified: Secondary | ICD-10-CM

## 2021-05-07 DIAGNOSIS — M545 Low back pain, unspecified: Secondary | ICD-10-CM | POA: Diagnosis not present

## 2021-05-07 DIAGNOSIS — M6281 Muscle weakness (generalized): Secondary | ICD-10-CM

## 2021-05-07 NOTE — Therapy (Signed)
[facility] @ [place], [place], [address] Phone: [phone]   Fax:  [phone]  Physical Therapy Treatment  Patient Details  Name: Debbie Shaw MRN: [id number] Date of Birth: 07/05/1970 Referring Provider (PT): Nelson Chimes, [place]   Encounter Date: 05/07/2021   PT End of Session - 05/07/21 1325     Visit Number 3    PT Start Time 0930    PT Stop Time 1016    PT Time Calculation (min) 46 min    Activity Tolerance Patient tolerated treatment well    Behavior During Therapy [facility] for tasks assessed/performed             Past Medical History:  Diagnosis Date   Abnormal Pap smear of cervix    Since 2005 with biopsy under colposcopy   Allergy    Anxiety    Asthma    as child   Depression    GERD (gastroesophageal reflux disease)    Panic attacks    Pure hypercholesterolemia 02/27/2018    Past Surgical History:  Procedure Laterality Date   CHOLECYSTECTOMY  age 76   crying, upset when waking up from anesthesia   FRACTURE SURGERY     KNEE ARTHROSCOPY     bilateral, multiple times   KNEE ARTHROSCOPY W/ ACL RECONSTRUCTION Left    in later 1990s   TONSILLECTOMY      There were no vitals filed for this visit.   Subjective Assessment - 05/07/21 0941     Subjective Patient arrives with complaints of pain.  She states her pain is almost like it was when it first started.  She has been using ice and ibuprofen without relief. She states she is not sleeping well.    Limitations Sitting;Lifting;Standing    How long can you sit comfortably? 30 min    How long can you stand comfortably? 15 min    How long can you walk comfortably? unlimited    Diagnostic tests xrays: DDD    Patient Stated Goals To resolve pain and be able to do routine daily activities without being so careful    Currently in Pain? Yes    Pain Score 4     Pain Location Back    Pain Orientation Medial    Pain Descriptors /  Indicators Aching;Nagging    Pain Type Acute pain    Pain Onset 1 to 4 weeks ago                               [facility] Adult PT Treatment/Exercise - 05/07/21 0001       Exercises   Exercises Lumbar;Knee/Hip      Lumbar Exercises: Stretches   Active Hamstring Stretch Left;2 reps;20 seconds;Right    Active Hamstring Stretch Limitations standing with foot on bed      Lumbar Exercises: Supine   Pelvic Tilt 20 reps    Dead Bug 20 reps    Dead Bug Limitations supported    Other Supine Lumbar Exercises Pelvic tilt with march x 20      Lumbar Exercises: Prone   Other Prone Lumbar Exercises McKenzie progression: prone x 1 min, prone on elbows x 1 min. prone press ups x10      Knee/Hip Exercises: Stretches   Hip Flexor Stretch Left;20 seconds    Hip Flexor Stretch Limitations foot on bed behind      Modalities  Modalities Moist Heat      Moist Heat Therapy   Number Minutes Moist Heat 8 Minutes    Moist Heat Location Lumbar Spine   prior to stretching due to patient so guarded                      PT Short Term Goals - 05/01/21 0930       PT SHORT TERM GOAL #1   Title Independence with initial HEP    Time 4    Period Weeks    Status New    Target Date 05/29/21      PT SHORT TERM GOAL #2   Title Pain no greater than 4/10    Time 4    Period Weeks    Status New    Target Date 05/29/21               PT Long Term Goals - 05/01/21 0931       PT LONG TERM GOAL #1   Title Independence with advanced HEP    Time 8    Period Weeks    Status New    Target Date 06/26/21      PT LONG TERM GOAL #2   Title Patient to be able to bend stoop and squat without pain    Time 8    Period Weeks    Status New    Target Date 06/26/21      PT LONG TERM GOAL #3   Title Patient to be able to do routine household chores without pain    Time 8    Period Weeks    Status New    Target Date 06/26/21      PT LONG TERM GOAL #4   Title Patient  to be able to ride in a car for longer trips    Time 8    Period Weeks    Status New    Target Date 06/26/21                   Plan - 05/07/21 1327     Clinical Impression Statement Patient responded well to todays treatment and reported decreased in pain but patient also did not respond to prednisone dose pack and may still be dealing with uncontrolled pain cycle.  She refuses to take any pain medication other than Tylenol because she has had paranoia and adverse reactions with narcotics in the past.  She would benefit from continued skilled PT for LE flexibility and core stabilization.    Personal Factors and Comorbidities Past/Current Experience    Examination-Activity Limitations Lift;Bend;Squat    Examination-Participation Restrictions Cleaning;Community Activity;Yard Work    Stability/Clinical Decision Making Stable/Uncomplicated    Designer, jewellery Low    Rehab Potential Excellent    PT Frequency 2x / week    PT Duration 8 weeks    PT Treatment/Interventions ADLs/Self Care Home Management;Aquatic Therapy;Traction;Moist Heat;Iontophoresis 4mg /ml Dexamethasone;Electrical Stimulation;Cryotherapy;Ultrasound;Gait training;DME Instruction;Therapeutic exercise;Therapeutic activities;Functional mobility training;Stair training;Balance training;Neuromuscular re-education;Patient/family education;Manual techniques;Passive range of motion;Vestibular;Vasopneumatic Device;Taping;Dry needling;Joint Manipulations;Spinal Manipulations    PT Next Visit Plan Recheck pelvic symmetry and alignment.  Assess response to McKenzie extension progression and progress if positive response.  Continue with LE stretching and core stabilization as tolerated.    PT Home Exercise Plan Access Code: [email or web address]    Consulted and Agree with Plan of Care Patient             Patient will benefit from skilled therapeutic  intervention in order to improve the following deficits and impairments:  Abnormal  gait, Decreased balance, Decreased endurance, Decreased mobility, Difficulty walking, Increased muscle spasms, Improper body mechanics, Decreased strength, Impaired flexibility, Pain, Postural dysfunction  Visit Diagnosis: Acute left-sided low back pain without sciatica  Muscle weakness (generalized)  Difficulty in walking, not elsewhere classified     Problem List Patient Active Problem List   Diagnosis Date Noted   Costochondritis 05/20/2018   Environmental and seasonal allergies 02/27/2018   H/O cold sores 02/27/2018   Pure hypercholesterolemia 02/27/2018   New onset headache 02/27/2018   Oral herpes simplex infection 01/03/2015   Anxiety 01/30/2013   FIBROCYSTIC BREAST DISEASE 02/10/2009   FATIGUE 02/10/2009    Anderson Malta B. Brentin Shin, PT 12/15/221:35 PM   [facility] @ [place], [place], [address] Phone: [phone]   Fax:  [phone]  Name: Metzli Pollick MRN: [id number] Date of Birth: 1971/04/07

## 2021-05-07 NOTE — Patient Instructions (Signed)
If extension exercises reduce symptoms repeat again tonight before bed.

## 2021-05-11 ENCOUNTER — Other Ambulatory Visit: Payer: Self-pay

## 2021-05-11 ENCOUNTER — Encounter: Payer: Self-pay | Admitting: Physical Therapy

## 2021-05-11 ENCOUNTER — Ambulatory Visit: Payer: No Typology Code available for payment source | Admitting: Physical Therapy

## 2021-05-11 DIAGNOSIS — R262 Difficulty in walking, not elsewhere classified: Secondary | ICD-10-CM

## 2021-05-11 DIAGNOSIS — M545 Low back pain, unspecified: Secondary | ICD-10-CM | POA: Diagnosis not present

## 2021-05-11 DIAGNOSIS — M6281 Muscle weakness (generalized): Secondary | ICD-10-CM

## 2021-05-11 NOTE — Therapy (Signed)
[facility] @ [address] [place], [place], [address] Phone: [phone]   Fax:  [phone]  Physical Therapy Treatment  Patient Details  Name: Debbie Shaw MRN: [id number] Date of Birth: 1971-02-11 Referring Provider (PT): Nelson Chimes, [place]   Encounter Date: 05/11/2021   PT End of Session - 05/11/21 0937     Visit Number 4    PT Start Time 0867    PT Stop Time 6195    PT Time Calculation (min) 53 min    Activity Tolerance Patient tolerated treatment well    Behavior During Therapy [facility] for tasks assessed/performed             Past Medical History:  Diagnosis Date   Abnormal Pap smear of cervix    Since 2005 with biopsy under colposcopy   Allergy    Anxiety    Asthma    as child   Depression    GERD (gastroesophageal reflux disease)    Panic attacks    Pure hypercholesterolemia 02/27/2018    Past Surgical History:  Procedure Laterality Date   CHOLECYSTECTOMY  age 24   crying, upset when waking up from anesthesia   FRACTURE SURGERY     KNEE ARTHROSCOPY     bilateral, multiple times   KNEE ARTHROSCOPY W/ ACL RECONSTRUCTION Left    in later 1990s   TONSILLECTOMY      There were no vitals filed for this visit.   Subjective Assessment - 05/11/21 0938     Subjective Back pain persists but staying at the lower levels. Pain stays constant. Pt did not feel worse after last session, about the same.    Currently in Pain? Yes    Pain Score 3     Pain Location Back    Pain Orientation Lower    Pain Descriptors / Indicators Sore    Aggravating Factors  Transitioning movements, stooping    Pain Relieving Factors heat    Multiple Pain Sites No                               [facility] Adult PT Treatment/Exercise - 05/11/21 0001       Self-Care   Self-Care ADL's;Posture    ADL's Lumbar protective, adding support, avoiding unsupported stooping      Lumbar Exercises: Stretches    Single Knee to Chest Stretch Left;Right;2 reps;30 seconds    Lower Trunk Rotation 3 reps;20 seconds      Lumbar Exercises: Prone   Other Prone Lumbar Exercises Prone with TA & Pilates breath 6x: pillow under feet: mini press ups 6x.      Modalities   Modalities Moist Heat;Electrical Stimulation      Moist Heat Therapy   Number Minutes Moist Heat --   During supine exercises and concurrent with IFC     Electrical Stimulation   Electrical Stimulation Location Lumbar    Electrical Stimulation Action IFC    Electrical Stimulation Parameters 80-150 HZ in hooklying    Electrical Stimulation Goals Pain      Manual Therapy   Manual Therapy Other (comment)    Other Manual Therapy Addaday to Lt QL & paraspinals in between her mini press up sets. A small amt of soft tissue to Lt QL                       PT Short Term Goals -  05/01/21 0930       PT SHORT TERM GOAL #1   Title Independence with initial HEP    Time 4    Period Weeks    Status New    Target Date 05/29/21      PT SHORT TERM GOAL #2   Title Pain no greater than 4/10    Time 4    Period Weeks    Status New    Target Date 05/29/21               PT Long Term Goals - 05/01/21 0931       PT LONG TERM GOAL #1   Title Independence with advanced HEP    Time 8    Period Weeks    Status New    Target Date 06/26/21      PT LONG TERM GOAL #2   Title Patient to be able to bend stoop and squat without pain    Time 8    Period Weeks    Status New    Target Date 06/26/21      PT LONG TERM GOAL #3   Title Patient to be able to do routine household chores without pain    Time 8    Period Weeks    Status New    Target Date 06/26/21      PT LONG TERM GOAL #4   Title Patient to be able to ride in a car for longer trips    Time 8    Period Weeks    Status New    Target Date 06/26/21                   Plan - 05/11/21 0954     Clinical Impression Statement Pt arrives with mild reports of  low and mid back pain that is constant. She was able to go to the [place] this weekend which involves walking and climbing stairs. This did not increase her pain but she hasd not made any further progress. Left lumbar QL and paraspinals taught at rest compared to the right. Pt has a TENS unit at home and will try to find it as it was beneficial at the end of todays session for relaxing her muscles. 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    Personal Factors and Comorbidities Past/Current Experience    Examination-Activity Limitations Lift;Bend;Squat    Examination-Participation Restrictions Cleaning;Community Activity;Yard Work    Stability/Clinical Decision Making Stable/Uncomplicated    Rehab Potential Excellent    PT Frequency 2x / week    PT Duration 8 weeks    PT Treatment/Interventions ADLs/Self Care Home Management;Aquatic Therapy;Traction;Moist Heat;Iontophoresis 4mg /ml Dexamethasone;Electrical Stimulation;Cryotherapy;Ultrasound;Gait training;DME Instruction;Therapeutic exercise;Therapeutic activities;Functional mobility training;Stair training;Balance training;Neuromuscular re-education;Patient/family education;Manual techniques;Passive range of motion;Vestibular;Vasopneumatic Device;Taping;Dry needling;Joint Manipulations;Spinal Manipulations    PT Next Visit Plan See if pt found her TENS using and is using? See how body mechanics are going, avoiding stooping? Contin with flexibility and strength.    PT Home Exercise Plan Access Code: [email or web address]    Consulted and Agree with Plan of Care Patient             Patient will benefit from skilled therapeutic intervention in order to improve the following deficits and impairments:  Abnormal gait, Decreased balance, Decreased endurance, Decreased mobility, Difficulty walking, Increased muscle spasms, Improper body mechanics, Decreased strength,  Impaired flexibility, Pain, Postural dysfunction  Visit Diagnosis: Acute  left-sided low back pain without sciatica  Muscle weakness (generalized)  Difficulty in walking, not elsewhere classified     Problem List Patient Active Problem List   Diagnosis Date Noted   Costochondritis 05/20/2018   Environmental and seasonal allergies 02/27/2018   H/O cold sores 02/27/2018   Pure hypercholesterolemia 02/27/2018   New onset headache 02/27/2018   Oral herpes simplex infection 01/03/2015   Anxiety 01/30/2013   FIBROCYSTIC BREAST DISEASE 02/10/2009   FATIGUE 02/10/2009    Hilma Steinhilber, PTA 05/11/2021, 11:40 AM  [facility] @ [place] [place], [place], [address] Phone: [phone]   Fax:  [phone]  Name: Debbie Shaw MRN: [id number] Date of Birth: January 01, 1971

## 2021-05-19 ENCOUNTER — Encounter: Payer: Self-pay | Admitting: Physical Therapy

## 2021-05-19 ENCOUNTER — Ambulatory Visit: Payer: No Typology Code available for payment source | Admitting: Physical Therapy

## 2021-05-19 ENCOUNTER — Other Ambulatory Visit: Payer: Self-pay

## 2021-05-19 DIAGNOSIS — M545 Low back pain, unspecified: Secondary | ICD-10-CM | POA: Diagnosis not present

## 2021-05-19 DIAGNOSIS — R262 Difficulty in walking, not elsewhere classified: Secondary | ICD-10-CM

## 2021-05-19 DIAGNOSIS — M6281 Muscle weakness (generalized): Secondary | ICD-10-CM

## 2021-05-19 NOTE — Therapy (Signed)
[facility] @ [address] [place], [place], [address] Phone: [phone]   Fax:  [phone]  Physical Therapy Treatment  Patient Details  Name: Debbie Shaw MRN: [id number] Date of Birth: 01/26/1971 Referring Provider (PT): Nelson Chimes, [place]   Encounter Date: 05/19/2021   PT End of Session - 05/19/21 1057     Visit Number 5    Date for PT Re-Evaluation 06/26/21    PT Start Time 1016    PT Stop Time 1108    PT Time Calculation (min) 52 min    Activity Tolerance Patient tolerated treatment well    Behavior During Therapy [facility] for tasks assessed/performed             Past Medical History:  Diagnosis Date   Abnormal Pap smear of cervix    Since 2005 with biopsy under colposcopy   Allergy    Anxiety    Asthma    as child   Depression    GERD (gastroesophageal reflux disease)    Panic attacks    Pure hypercholesterolemia 02/27/2018    Past Surgical History:  Procedure Laterality Date   CHOLECYSTECTOMY  age 50   crying, upset when waking up from anesthesia   FRACTURE SURGERY     KNEE ARTHROSCOPY     bilateral, multiple times   KNEE ARTHROSCOPY W/ ACL RECONSTRUCTION Left    in later 1990s   TONSILLECTOMY      There were no vitals filed for this visit.   Subjective Assessment - 05/19/21 1017     Subjective I am getting better but I am shocked I am not better than this.  I had a brief very sharp pain when I turned when driving which left me sore for a day late last week.  I still wake up with Lt hip pain and back pain.  I found my TENS machine and used it once.  I feel better when i'm stretching.  I am worse when I sit and don't move and then have to move.    Limitations Sitting;Lifting;Standing    How long can you sit comfortably? 30 min    How long can you stand comfortably? 15 min    How long can you walk comfortably? unlimited    Diagnostic tests xrays: DDD    Patient Stated Goals To resolve  pain and be able to do routine daily activities without being so careful    Currently in Pain? Yes    Pain Score 2     Pain Location Back    Pain Orientation Lower;Left    Pain Descriptors / Indicators Dull;Tightness;Aching    Pain Type Chronic pain    Pain Onset More than a month ago    Pain Frequency Intermittent                               [facility] Adult PT Treatment/Exercise - 05/19/21 0001       Exercises   Exercises Lumbar;Knee/Hip;Shoulder      Lumbar Exercises: Stretches   Active Hamstring Stretch Left;Right;30 seconds;1 rep    Active Hamstring Stretch Limitations foot on chair, single UE support      Lumbar Exercises: Standing   Other Standing Lumbar Exercises wall plank 3x10"      Lumbar Exercises: Supine   Pelvic Tilt 20 reps    Heel Slides 10 reps    Heel Slides Limitations towel under  heel, focus on TA    Bent Knee Raise 20 reps    Bent Knee Raise Limitations in pelvic tilt    Dead Bug 20 reps    Dead Bug Limitations with pilates breathing, supported      Lumbar Exercises: Quadruped   Madcat/Old Horse 10 reps    Madcat/Old Horse Limitations old horse to neutral only    Straight Leg Raise 2 seconds    Straight Leg Raises Limitations 3 rounds Rt/Lt on elbows      Knee/Hip Exercises: Stretches   Hip Flexor Stretch Both;1 rep;30 seconds    Hip Flexor Stretch Limitations foot on chair, single UE support    Piriformis Stretch Left;1 rep;30 seconds;Right    Piriformis Stretch Limitations diagonal knee hug in hooklying    Gastroc Stretch Both;1 rep;30 seconds    Gastroc Stretch Limitations slant board    Other Knee/Hip Stretches figure 4 with OP into ER x 30" bil      Knee/Hip Exercises: Standing   Hip Abduction Stengthening;Both;1 set;5 reps;Knee straight    Hip Extension Stengthening;Both;1 set;5 reps;Knee straight      Modalities   Modalities Electrical Stimulation;Moist Heat      Moist Heat Therapy   Number Minutes Moist Heat 15  Minutes   concurrent with supine ther ex   Moist Heat Location Lumbar Spine      Electrical Stimulation   Electrical Stimulation Location Lumbar   concurrent with supine ther ex   Electrical Stimulation Action IFC    Electrical Stimulation Parameters 80-150 HZ, 12.5-15 volts CV    Electrical Stimulation Goals Pain                       PT Short Term Goals - 05/01/21 0930       PT SHORT TERM GOAL #1   Title Independence with initial HEP    Time 4    Period Weeks    Status New    Target Date 05/29/21      PT SHORT TERM GOAL #2   Title Pain no greater than 4/10    Time 4    Period Weeks    Status New    Target Date 05/29/21               PT Long Term Goals - 05/01/21 0931       PT LONG TERM GOAL #1   Title Independence with advanced HEP    Time 8    Period Weeks    Status New    Target Date 06/26/21      PT LONG TERM GOAL #2   Title Patient to be able to bend stoop and squat without pain    Time 8    Period Weeks    Status New    Target Date 06/26/21      PT LONG TERM GOAL #3   Title Patient to be able to do routine household chores without pain    Time 8    Period Weeks    Status New    Target Date 06/26/21      PT LONG TERM GOAL #4   Title Patient to be able to ride in a car for longer trips    Time 8    Period Weeks    Status New    Target Date 06/26/21                   Plan - 05/19/21 1103  Clinical Impression Statement Pt continues to have stable low level pain but vocalizes frustration with lack of progress.  Pain is deep and located in anterior, lateral and posterior hip and back.  She does better when she keeps moving and with LE stretches.  PT used caution and close supervision for response to addition of standing hip abd/ext, quadupred hip ext and wall plank with TA focus using low reps given Pt's pain sensitivity.  Pt does well with supine ther ex for core stabilization paired with heat and IFC.  If Pt reports  good tolerance of added ther ex today, consider progressing hip and core strength next visit and updating HEP.  Pt has portable estim unit and PT encouraged her to use it as needed throughout the day.    PT Frequency 2x / week    PT Duration 8 weeks    PT Treatment/Interventions ADLs/Self Care Home Management;Aquatic Therapy;Traction;Moist Heat;Iontophoresis 4mg /ml Dexamethasone;Electrical Stimulation;Cryotherapy;Ultrasound;Gait training;DME Instruction;Therapeutic exercise;Therapeutic activities;Functional mobility training;Stair training;Balance training;Neuromuscular re-education;Patient/family education;Manual techniques;Passive range of motion;Vestibular;Vasopneumatic Device;Taping;Dry needling;Joint Manipulations;Spinal Manipulations    PT Next Visit Plan Pt needs to schedule more visits, f/u on response to added ther ex last time for hip and core strength, heat/IFC with supine ther ex, LE stretches, progress strength as tol    PT Home Exercise Plan Access Code: [email or web address]    Consulted and Agree with Plan of Care Patient             Patient will benefit from skilled therapeutic intervention in order to improve the following deficits and impairments:     Visit Diagnosis: Acute left-sided low back pain without sciatica  Muscle weakness (generalized)  Difficulty in walking, not elsewhere classified     Problem List Patient Active Problem List   Diagnosis Date Noted   Costochondritis 05/20/2018   Environmental and seasonal allergies 02/27/2018   H/O cold sores 02/27/2018   Pure hypercholesterolemia 02/27/2018   New onset headache 02/27/2018   Oral herpes simplex infection 01/03/2015   Anxiety 01/30/2013   FIBROCYSTIC BREAST DISEASE 02/10/2009   FATIGUE 02/10/2009    Baruch Merl, PT 05/19/21 11:18 AM   [facility] @ [place], [place], [address] Phone: [phone]   Fax:  [phone]  Name: Andrienne Havener MRN: [id number] Date of Birth: 1971/03/29

## 2021-05-22 ENCOUNTER — Ambulatory Visit: Payer: No Typology Code available for payment source

## 2021-06-01 ENCOUNTER — Ambulatory Visit: Payer: No Typology Code available for payment source | Attending: Orthopedic Surgery | Admitting: Physical Therapy

## 2021-06-01 ENCOUNTER — Other Ambulatory Visit: Payer: Self-pay

## 2021-06-01 ENCOUNTER — Encounter: Payer: Self-pay | Admitting: Physical Therapy

## 2021-06-01 DIAGNOSIS — R262 Difficulty in walking, not elsewhere classified: Secondary | ICD-10-CM | POA: Insufficient documentation

## 2021-06-01 DIAGNOSIS — M545 Low back pain, unspecified: Secondary | ICD-10-CM | POA: Diagnosis present

## 2021-06-01 DIAGNOSIS — M6281 Muscle weakness (generalized): Secondary | ICD-10-CM | POA: Insufficient documentation

## 2021-06-01 NOTE — Therapy (Signed)
[facility] @ [place], [place], [address] Phone: [phone]   Fax:  [phone]  Physical Therapy Treatment  Patient Details  Name: Debbie Shaw MRN: [id number] Date of Birth: [birth year] Referring Provider (PT): Nelson Chimes, [place]   Encounter Date: 06/01/2021   PT End of Session - 06/01/21 1427     Visit Number 6    Date for PT Re-Evaluation 06/26/21    PT Start Time 1400    PT Stop Time 1438    PT Time Calculation (min) 38 min    Activity Tolerance Patient tolerated treatment well    Behavior During Therapy [facility] for tasks assessed/performed             Past Medical History:  Diagnosis Date   Abnormal Pap smear of cervix    Since 2005 with biopsy under colposcopy   Allergy    Anxiety    Asthma    as child   Depression    GERD (gastroesophageal reflux disease)    Panic attacks    Pure hypercholesterolemia 02/27/2018    Past Surgical History:  Procedure Laterality Date   CHOLECYSTECTOMY  age 44   crying, upset when waking up from anesthesia   FRACTURE SURGERY     KNEE ARTHROSCOPY     bilateral, multiple times   KNEE ARTHROSCOPY W/ ACL RECONSTRUCTION Left    in later 1990s   TONSILLECTOMY      There were no vitals filed for this visit.   Subjective Assessment - 06/01/21 1402     Subjective I am doing better, more consistently. Sleeping is still difficult.    Currently in Pain? No/denies                               [facility] Adult PT Treatment/Exercise - 06/01/21 0001       Lumbar Exercises: Stretches   Active Hamstring Stretch Left;Right;30 seconds;3 reps    Active Hamstring Stretch Limitations foot on chair, single UE support    Lower Trunk Rotation 1 rep;30 seconds    Quad Stretch --   Quad & hip flexor Bil 3x 20 sec standing   Piriformis Stretch Left;Right;3 reps;20 seconds      Lumbar Exercises: Supine   Pelvic Tilt 20 reps    Pelvic Tilt  Limitations Add to HEP    Heel Slides 10 reps    Bent Knee Raise 20 reps    Bent Knee Raise Limitations in pelvic tilt   Added to HEP   Dead Bug 10 reps    Dead Bug Limitations with pilates breathing, supported      Lumbar Exercises: Quadruped   Madcat/Old Horse 10 reps    Madcat/Old Horse Limitations old horse to neutral only   added to HEP   Straight Leg Raise 3 seconds   Added to HEP   Straight Leg Raises Limitations VC to level pelvis out      Knee/Hip Exercises: Stretches   Gastroc Stretch Both;30 seconds;3 reps    Gastroc Stretch Limitations slant board                       PT Short Term Goals - 05/01/21 0930       PT SHORT TERM GOAL #1   Title Independence with initial HEP    Time 4    Period Weeks    Status New  Target Date 05/29/21      PT SHORT TERM GOAL #2   Title Pain no greater than 4/10    Time 4    Period Weeks    Status New    Target Date 05/29/21               PT Long Term Goals - 05/01/21 0931       PT LONG TERM GOAL #1   Title Independence with advanced HEP    Time 8    Period Weeks    Status New    Target Date 06/26/21      PT LONG TERM GOAL #2   Title Patient to be able to bend stoop and squat without pain    Time 8    Period Weeks    Status New    Target Date 06/26/21      PT LONG TERM GOAL #3   Title Patient to be able to do routine household chores without pain    Time 8    Period Weeks    Status New    Target Date 06/26/21      PT LONG TERM GOAL #4   Title Patient to be able to ride in a car for longer trips    Time 8    Period Weeks    Status New    Target Date 06/26/21                   Plan - 06/01/21 1416     Clinical Impression Statement Pt arrives reporting she is consistently feeling better, less pain, less intense. Most often her pain presents at night time. She is working on getting a new matress to see if something more supportive is helpful. Pt had no pain during todays session.  Lower abdominal weakness present, visable shaking with bridge. Pt was quite literally sleepy during her session today, close to falling  asleep during stretches.    Personal Factors and Comorbidities Past/Current Experience    Examination-Activity Limitations Lift;Bend;Squat    Examination-Participation Restrictions Cleaning;Community Activity;Yard Work    Stability/Clinical Decision Making Stable/Uncomplicated    Rehab Potential Excellent    PT Frequency 2x / week    PT Duration 8 weeks    PT Treatment/Interventions ADLs/Self Care Home Management;Aquatic Therapy;Traction;Moist Heat;Iontophoresis 4mg /ml Dexamethasone;Electrical Stimulation;Cryotherapy;Ultrasound;Gait training;DME Instruction;Therapeutic exercise;Therapeutic activities;Functional mobility training;Stair training;Balance training;Neuromuscular re-education;Patient/family education;Manual techniques;Passive range of motion;Vestibular;Vasopneumatic Device;Taping;Dry needling;Joint Manipulations;Spinal Manipulations    PT Next Visit Plan Core and hip strength, pt may not have stopped at window to make more appts.    PT Home Exercise Plan Access Code: [email or web address]    Consulted and Agree with Plan of Care Patient             Patient will benefit from skilled therapeutic intervention in order to improve the following deficits and impairments:  Abnormal gait, Decreased balance, Decreased endurance, Decreased mobility, Difficulty walking, Increased muscle spasms, Improper body mechanics, Decreased strength, Impaired flexibility, Pain, Postural dysfunction  Visit Diagnosis: Acute left-sided low back pain without sciatica  Difficulty in walking, not elsewhere classified  Muscle weakness (generalized)     Problem List Patient Active Problem List   Diagnosis Date Noted   Costochondritis 05/20/2018   Environmental and seasonal allergies 02/27/2018   H/O cold sores 02/27/2018   Pure hypercholesterolemia 02/27/2018   New onset  headache 02/27/2018   Oral herpes simplex infection 01/03/2015   Anxiety 01/30/2013   FIBROCYSTIC BREAST DISEASE 02/10/2009   FATIGUE 02/10/2009  Ambermarie Honeyman, PTA 06/01/2021, 2:42 PM  [facility] @ [place], [place], [address] Phone: [phone]   Fax:  [phone]  Name: Ameirah Khatoon MRN: [id number] Date of Birth: [birth year]

## 2021-06-01 NOTE — Therapy (Addendum)
[facility] @ [place], [place], [address] Phone: [phone]   Fax:  [phone]  Physical Therapy Treatment  Patient Details  Name: Debbie Shaw MRN: [id number] Date of Birth: 10-Apr-1971 Referring Provider (PT): Nelson Chimes, [place]   Encounter Date: 06/01/2021   PT End of Session - 06/01/21 1427     Visit Number 6    Date for PT Re-Evaluation 06/26/21    PT Start Time 1400    PT Stop Time 1438    PT Time Calculation (min) 38 min    Activity Tolerance Patient tolerated treatment well    Behavior During Therapy [facility] for tasks assessed/performed             Past Medical History:  Diagnosis Date   Abnormal Pap smear of cervix    Since 2005 with biopsy under colposcopy   Allergy    Anxiety    Asthma    as child   Depression    GERD (gastroesophageal reflux disease)    Panic attacks    Pure hypercholesterolemia 02/27/2018    Past Surgical History:  Procedure Laterality Date   CHOLECYSTECTOMY  age 51   crying, upset when waking up from anesthesia   FRACTURE SURGERY     KNEE ARTHROSCOPY     bilateral, multiple times   KNEE ARTHROSCOPY W/ ACL RECONSTRUCTION Left    in later 1990s   TONSILLECTOMY      There were no vitals filed for this visit.   Subjective Assessment - 06/01/21 1402     Subjective I am doing better, more consistently. Sleeping is still difficult.    Currently in Pain? No/denies                               [facility] Adult PT Treatment/Exercise - 06/01/21 0001       Lumbar Exercises: Stretches   Active Hamstring Stretch Left;Right;30 seconds;3 reps    Active Hamstring Stretch Limitations foot on chair, single UE support    Lower Trunk Rotation 1 rep;30 seconds    Quad Stretch --   Quad & hip flexor Bil 3x 20 sec standing   Piriformis Stretch Left;Right;3 reps;20 seconds      Lumbar Exercises: Supine   Pelvic Tilt 20 reps    Pelvic Tilt  Limitations Add to HEP    Heel Slides 10 reps    Bent Knee Raise 20 reps    Bent Knee Raise Limitations in pelvic tilt   Added to HEP   Dead Bug 10 reps    Dead Bug Limitations with pilates breathing, supported      Lumbar Exercises: Quadruped   Madcat/Old Horse 10 reps    Madcat/Old Horse Limitations old horse to neutral only   added to HEP   Straight Leg Raise 3 seconds   Added to HEP   Straight Leg Raises Limitations VC to level pelvis out      Knee/Hip Exercises: Stretches   Gastroc Stretch Both;30 seconds;3 reps    Gastroc Stretch Limitations slant board                     PT Education - 06/01/21 1444     Education Details HEP    Person(s) Educated Patient    Methods Explanation;Demonstration;Verbal cues;Handout    Comprehension Returned demonstration;Verbalized understanding  PT Short Term Goals - 05/01/21 0930       PT SHORT TERM GOAL #1   Title Independence with initial HEP    Time 4    Period Weeks    Status New    Target Date 05/29/21      PT SHORT TERM GOAL #2   Title Pain no greater than 4/10    Time 4    Period Weeks    Status New    Target Date 05/29/21               PT Long Term Goals - 05/01/21 0931       PT LONG TERM GOAL #1   Title Independence with advanced HEP    Time 8    Period Weeks    Status New    Target Date 06/26/21      PT LONG TERM GOAL #2   Title Patient to be able to bend stoop and squat without pain    Time 8    Period Weeks    Status New    Target Date 06/26/21      PT LONG TERM GOAL #3   Title Patient to be able to do routine household chores without pain    Time 8    Period Weeks    Status New    Target Date 06/26/21      PT LONG TERM GOAL #4   Title Patient to be able to ride in a car for longer trips    Time 8    Period Weeks    Status New    Target Date 06/26/21                   Plan - 06/01/21 1416     Clinical Impression Statement Pt arrives reporting  she is consistently feeling better, less pain, less intense. Most often her pain presents at night time. She is working on getting a new matress to see if something more supportive is helpful. Pt had no pain during todays session. Lower abdominal weakness present, visable shaking with bridge. Pt was quite literally sleepy during her session today, close to falling  asleep during stretches. HEP progressed today.    Personal Factors and Comorbidities Past/Current Experience    Examination-Activity Limitations Lift;Bend;Squat    Examination-Participation Restrictions Cleaning;Community Activity;Yard Work    Stability/Clinical Decision Making Stable/Uncomplicated    Rehab Potential Excellent    PT Frequency 2x / week    PT Duration 8 weeks    PT Treatment/Interventions ADLs/Self Care Home Management;Aquatic Therapy;Traction;Moist Heat;Iontophoresis 4mg /ml Dexamethasone;Electrical Stimulation;Cryotherapy;Ultrasound;Gait training;DME Instruction;Therapeutic exercise;Therapeutic activities;Functional mobility training;Stair training;Balance training;Neuromuscular re-education;Patient/family education;Manual techniques;Passive range of motion;Vestibular;Vasopneumatic Device;Taping;Dry needling;Joint Manipulations;Spinal Manipulations    PT Next Visit Plan Core and hip strength, pt may not have stopped at window to make more appts.    PT Home Exercise Plan Access Code: [email or web address]    Consulted and Agree with Plan of Care Patient             Patient will benefit from skilled therapeutic intervention in order to improve the following deficits and impairments:  Abnormal gait, Decreased balance, Decreased endurance, Decreased mobility, Difficulty walking, Increased muscle spasms, Improper body mechanics, Decreased strength, Impaired flexibility, Pain, Postural dysfunction  Visit Diagnosis: Acute left-sided low back pain without sciatica  Difficulty in walking, not elsewhere classified  Muscle weakness  (generalized)     Problem List Patient Active Problem List   Diagnosis Date Noted   Costochondritis 05/20/2018  Environmental and seasonal allergies 02/27/2018   H/O cold sores 02/27/2018   Pure hypercholesterolemia 02/27/2018   New onset headache 02/27/2018   Oral herpes simplex infection 01/03/2015   Anxiety 01/30/2013   FIBROCYSTIC BREAST DISEASE 02/10/2009   FATIGUE 02/10/2009    Anavictoria Wilk, PTA 06/01/2021, 2:44 PM  PHYSICAL THERAPY DISCHARGE SUMMARY  Visits from Start of Care: 6  Current functional level related to goals / functional outcomes: Pt made partial gains in reduced pain with activity.  She did not return to clinic for LBP but rather is attended PT with another diagnosis.  Discharge this episode of care.   Remaining deficits: See above   Education / Equipment: HEP  Patient agrees to discharge. Patient goals were partially met. Patient is being discharged due to not returning since the last visit.  Baruch Merl, PT 06/23/21 9:56 AM  [facility] @ [place], [place], [address] Phone: [phone]   Fax:  [phone]  Name: Logan Vegh MRN: [id number] Date of Birth: 12-21-1970

## 2021-06-01 NOTE — Therapy (Signed)
[facility] @ [place], [place], [address] Phone: [phone]   Fax:  [phone]  Physical Therapy Treatment  Patient Details  Name: Debbie Shaw MRN: [id number] Date of Birth: 03/15/1971 Referring Provider (PT): Nelson Chimes, [place]   Encounter Date: 06/01/2021   PT End of Session - 06/01/21 1427     Visit Number 6    Date for PT Re-Evaluation 06/26/21    PT Start Time 1400    PT Stop Time 1438    PT Time Calculation (min) 38 min    Activity Tolerance Patient tolerated treatment well    Behavior During Therapy [facility] for tasks assessed/performed             Past Medical History:  Diagnosis Date   Abnormal Pap smear of cervix    Since 2005 with biopsy under colposcopy   Allergy    Anxiety    Asthma    as child   Depression    GERD (gastroesophageal reflux disease)    Panic attacks    Pure hypercholesterolemia 02/27/2018    Past Surgical History:  Procedure Laterality Date   CHOLECYSTECTOMY  age 51   crying, upset when waking up from anesthesia   FRACTURE SURGERY     KNEE ARTHROSCOPY     bilateral, multiple times   KNEE ARTHROSCOPY W/ ACL RECONSTRUCTION Left    in later 1990s   TONSILLECTOMY      There were no vitals filed for this visit.   Subjective Assessment - 06/01/21 1402     Subjective I am doing better, more consistently. Sleeping is still difficult.    Currently in Pain? No/denies                               [facility] Adult PT Treatment/Exercise - 06/01/21 0001       Lumbar Exercises: Stretches   Active Hamstring Stretch Left;Right;30 seconds;3 reps    Active Hamstring Stretch Limitations foot on chair, single UE support    Lower Trunk Rotation 1 rep;30 seconds    Quad Stretch --   Quad & hip flexor Bil 3x 20 sec standing   Piriformis Stretch Left;Right;3 reps;20 seconds      Lumbar Exercises: Supine   Pelvic Tilt 20 reps    Pelvic Tilt  Limitations Add to HEP    Heel Slides 10 reps    Bent Knee Raise 20 reps    Bent Knee Raise Limitations in pelvic tilt   Added to HEP   Dead Bug 10 reps    Dead Bug Limitations with pilates breathing, supported      Lumbar Exercises: Quadruped   Madcat/Old Horse 10 reps    Madcat/Old Horse Limitations old horse to neutral only   added to HEP   Straight Leg Raise 3 seconds   Added to HEP   Straight Leg Raises Limitations VC to level pelvis out      Knee/Hip Exercises: Stretches   Gastroc Stretch Both;30 seconds;3 reps    Gastroc Stretch Limitations slant board                       PT Short Term Goals - 05/01/21 0930       PT SHORT TERM GOAL #1   Title Independence with initial HEP    Time 4    Period Weeks    Status New  Target Date 05/29/21      PT SHORT TERM GOAL #2   Title Pain no greater than 4/10    Time 4    Period Weeks    Status New    Target Date 05/29/21               PT Long Term Goals - 05/01/21 0931       PT LONG TERM GOAL #1   Title Independence with advanced HEP    Time 8    Period Weeks    Status New    Target Date 06/26/21      PT LONG TERM GOAL #2   Title Patient to be able to bend stoop and squat without pain    Time 8    Period Weeks    Status New    Target Date 06/26/21      PT LONG TERM GOAL #3   Title Patient to be able to do routine household chores without pain    Time 8    Period Weeks    Status New    Target Date 06/26/21      PT LONG TERM GOAL #4   Title Patient to be able to ride in a car for longer trips    Time 8    Period Weeks    Status New    Target Date 06/26/21                   Plan - 06/01/21 1416     Clinical Impression Statement Pt arrives reporting she is consistently feeling better, less pain, less intense. Most often her pain presents at night time. She is working on getting a new matress to see if something more supportive is helpful. Pt had no pain during todays session.  Lower abdominal weakness present, visable shaking with bridge. Pt was quite literally sleepy during her session today, close to falling  asleep during stretches.    Personal Factors and Comorbidities Past/Current Experience    Examination-Activity Limitations Lift;Bend;Squat    Examination-Participation Restrictions Cleaning;Community Activity;Yard Work    Stability/Clinical Decision Making Stable/Uncomplicated    Rehab Potential Excellent    PT Frequency 2x / week    PT Duration 8 weeks    PT Treatment/Interventions ADLs/Self Care Home Management;Aquatic Therapy;Traction;Moist Heat;Iontophoresis 4mg /ml Dexamethasone;Electrical Stimulation;Cryotherapy;Ultrasound;Gait training;DME Instruction;Therapeutic exercise;Therapeutic activities;Functional mobility training;Stair training;Balance training;Neuromuscular re-education;Patient/family education;Manual techniques;Passive range of motion;Vestibular;Vasopneumatic Device;Taping;Dry needling;Joint Manipulations;Spinal Manipulations    PT Next Visit Plan Core and hip strength, pt may not have stopped at window to make more appts.    PT Home Exercise Plan Access Code: [email or web address]    Consulted and Agree with Plan of Care Patient             Patient will benefit from skilled therapeutic intervention in order to improve the following deficits and impairments:  Abnormal gait, Decreased balance, Decreased endurance, Decreased mobility, Difficulty walking, Increased muscle spasms, Improper body mechanics, Decreased strength, Impaired flexibility, Pain, Postural dysfunction  Visit Diagnosis: Acute left-sided low back pain without sciatica  Difficulty in walking, not elsewhere classified  Muscle weakness (generalized)     Problem List Patient Active Problem List   Diagnosis Date Noted   Costochondritis 05/20/2018   Environmental and seasonal allergies 02/27/2018   H/O cold sores 02/27/2018   Pure hypercholesterolemia 02/27/2018   New onset  headache 02/27/2018   Oral herpes simplex infection 01/03/2015   Anxiety 01/30/2013   FIBROCYSTIC BREAST DISEASE 02/10/2009   FATIGUE 02/10/2009  Tevion Laforge, PTA 06/01/2021, 2:43 PM  [place] @ [place], [place], [address] Phone: [phone]   Fax:  [phone]  Name: Debbie Shaw MRN: [id number] Date of Birth: January 30, 1971  Access Code: [email or web address] URL: [email or web address] Date: 06/01/2021 Prepared by: Myrene Galas  Exercises Standing Hamstring Stretch on Chair - 2 x daily - 7 x weekly - 1 sets - 3 reps - 30 sec hold Standing Quad Stretch with Table and Chair Support - 2 x daily - 7 x weekly - 1 sets - 3 reps - 30 sec hold Hip Flexor Stretch with Chair - 2 x daily - 7 x weekly - 1 sets - 2 reps - 30 hold Supine Bridge with Resistance Band - 2 x daily - 7 x weekly - 3 sets - 5 reps Squat with Chair Touch and Resistance Loop - 1 x daily - 7 x weekly - 3 sets - 5 reps Lying Prone with 1 Pillow - 2 x daily - 7 x weekly - 1 sets - 1 reps - 1 min hold Static Prone on Elbows - 2 x daily - 7 x weekly - 1 sets - 1 reps - 1 min hold Prone Press Up - 2 x daily - 7 x weekly - 1 sets - 10 reps Cat Cow - 2 x daily - 7 x weekly - 10 reps Quadruped Leg Lifts - 1 x daily - 7 x weekly - 1 sets - 5 reps - 3 hold Supine March with Posterior Pelvic Tilt - 1 x daily - 7 x weekly - 2 sets - 10 reps

## 2021-06-03 ENCOUNTER — Encounter: Payer: No Typology Code available for payment source | Admitting: Physical Therapy

## 2021-06-15 ENCOUNTER — Other Ambulatory Visit: Payer: Self-pay

## 2021-06-19 ENCOUNTER — Other Ambulatory Visit: Payer: Self-pay

## 2021-06-19 ENCOUNTER — Ambulatory Visit: Payer: No Typology Code available for payment source | Attending: Family Medicine | Admitting: Physical Therapy

## 2021-06-19 ENCOUNTER — Encounter: Payer: Self-pay | Admitting: Physical Therapy

## 2021-06-19 DIAGNOSIS — R262 Difficulty in walking, not elsewhere classified: Secondary | ICD-10-CM | POA: Insufficient documentation

## 2021-06-19 DIAGNOSIS — M6281 Muscle weakness (generalized): Secondary | ICD-10-CM | POA: Insufficient documentation

## 2021-06-19 DIAGNOSIS — M545 Low back pain, unspecified: Secondary | ICD-10-CM | POA: Insufficient documentation

## 2021-06-19 DIAGNOSIS — R279 Unspecified lack of coordination: Secondary | ICD-10-CM | POA: Insufficient documentation

## 2021-06-21 NOTE — Therapy (Signed)
[facility] @ [place], [place], [address] Phone: [phone]   Fax:  [phone]  Physical Therapy Evaluation  Patient Details  Name: Debbie Shaw MRN: [id number] Date of Birth: 03/29/1971 Referring Provider (PT): Faustino Congress, NP   Encounter Date: 06/19/2021   PT End of Session - 06/21/21 1719     Visit Number 1    Date for PT Re-Evaluation 09/11/21    Authorization Type [employer]    PT Start Time 1107    PT Stop Time 1148    PT Time Calculation (min) 41 min    Activity Tolerance Patient tolerated treatment well    Behavior During Therapy [facility] for tasks assessed/performed             Past Medical History:  Diagnosis Date   Abnormal Pap smear of cervix    Since 2005 with biopsy under colposcopy   Allergy    Anxiety    Asthma    as child   Depression    GERD (gastroesophageal reflux disease)    Panic attacks    Pure hypercholesterolemia 02/27/2018    Past Surgical History:  Procedure Laterality Date   CHOLECYSTECTOMY  age 51   crying, upset when waking up from anesthesia   FRACTURE SURGERY     KNEE ARTHROSCOPY     bilateral, multiple times   KNEE ARTHROSCOPY W/ ACL RECONSTRUCTION Left    in later 1990s   TONSILLECTOMY      There were no vitals filed for this visit.    Subjective Assessment - 06/21/21 1728     Subjective It has been happening for a year.  Coughing, sneezing, quick movements.  Happens a few times a week where I have leakage.  Sometimes things feel weird, maybe pressure.    Limitations Sitting;Lifting;Standing    Patient Stated Goals not have leakage    Currently in Pain? 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 area    Lumbar - Right Side Bend fingertips to knee joint line    Lumbar - Left Side Bend fingertips to knee joint line    Lumbar - Right Rotation WNL    Lumbar - Left Rotation WNL      Strength   Overall Strength Comments Left hip flexion, abduction 4+/5 without discomfort      Flexibility   Soft Tissue Assessment /Muscle Length yes  Hamstrings to approx 60 degrees on right and 50 degrees on left      Palpation   SI assessment  Lt upslip with anterior rotation      Ambulation/Gait   Gait Pattern Within Functional Limits                        Objective measurements completed on examination: See above findings.     Pelvic Floor Special Questions - 06/21/21 0001     Are you Pregnant or attempting pregnancy? No    Prior Pregnancies Yes    Number of Pregnancies 2    Number of Vaginal Deliveries 1    Currently Sexually Active Yes    Marinoff Scale discomfort that does not affect completion    Urinary Leakage Yes    How often few times a week    Pad use panty liners 1-2/day    Urinary urgency Yes   sometimes   Urinary frequency 4-5 in a morning - hourly    Fluid intake 10 glasses of water    Caffeine beverages coffee + alchol    Falling out feeling (prolapse) Yes    Activities that cause feeling of prolapse sometimes not sure when    Pelvic  Floor Internal Exam pt identity confirmed and informed consent given to perform internal soft tissue    Exam Type Vaginal    Sensation difficulty coordinating with cough or exhale    Palpation normal    Strength fair squeeze, definite lift    Strength # of reps 1   5 quick flicks   Strength # of seconds 20                Trigger Point Dry Needling - 06/21/21 0001     Education Handout Provided Yes                   PT Education - 06/21/21 1736     Education Details dry needling info    Person(s) Educated Patient    Methods Explanation;Handout    Comprehension Verbalized understanding              PT Short Term Goals - 06/21/21 1721       PT SHORT TERM GOAL #1   Title Independence with initial HEP    Time 4    Period Weeks    Status New    Target Date 07/17/21               PT Long Term Goals - 06/21/21 1725       PT LONG TERM GOAL #1   Title Independence with advanced HEP    Time 12    Period Weeks    Status New    Target Date 09/11/21      PT LONG TERM GOAL #2   Title Patient to be able to bend stoop and squat without pain    Time 12    Period Weeks    Status New    Target Date 09/11/21      PT LONG TERM GOAL #3   Title Patient to be able to do routine household chores without pain or leakage    Time 12    Period Weeks    Status New    Target Date 09/11/21      PT LONG TERM GOAL #4   Title Pt will be able to cough or sneeze or exercises without leakage  Time 12    Period Weeks    Status New    Target Date 09/11/21                    Plan - 06/21/21 1729     Clinical Impression Statement Pt presents to skilled PT due to urinary leakage.  She has also had SI joint pain that had improved with PT and is through with seeing PT for that at this time, but she does occasionally report some pain even though it is much better. Pt has a lot of tension throughout Rt LE and pelvis.  Pt has posterior pelvic rotation on the  Rt side. Pt has pelvic strength of 3/5 and 5 quick flicks.  She can hold for 20 sec for 1 rep.  Pt will benefit from skilled PT to address pelvic floor strength and endurance. She is not coordinating correctly when breathing out and coughing and gets some    Personal Factors and Comorbidities Comorbidity 1    Comorbidities vaginal deliveries with tearing    Examination-Activity Limitations Toileting;Continence    Examination-Participation Restrictions Community Activity;Other    Stability/Clinical Decision Making Stable/Uncomplicated    Clinical Decision Making Low    Rehab Potential Excellent    PT Frequency 1x / week    PT Duration 12 weeks    PT Treatment/Interventions ADLs/Self Care Home Management;Aquatic Therapy;Traction;Moist Heat;Iontophoresis 4mg /ml Dexamethasone;Electrical Stimulation;Cryotherapy;Ultrasound;Gait training;DME Instruction;Therapeutic exercise;Therapeutic activities;Functional mobility training;Stair training;Balance training;Neuromuscular re-education;Patient/family education;Manual techniques;Passive range of motion;Vestibular;Vasopneumatic Device;Taping;Dry needling;Joint Manipulations;Spinal Manipulations;Biofeedback    PT Next Visit Plan h/s and gluteal dry needling/STM; exhale with exertion    PT Home Exercise Plan Access Code: [email or web address]    Consulted and Agree with Plan of Care Patient             Patient will benefit from skilled therapeutic intervention in order to improve the following deficits and impairments:  Abnormal gait, Decreased balance, Decreased endurance, Decreased mobility, Difficulty walking, Increased muscle spasms, Improper body mechanics, Decreased strength, Impaired flexibility, Pain, Postural dysfunction, Decreased coordination  Visit Diagnosis: Acute left-sided low back pain without sciatica  Difficulty in walking, not elsewhere classified  Muscle weakness (generalized)  Unspecified lack of coordination     Problem List Patient  Active Problem List   Diagnosis Date Noted   Costochondritis 05/20/2018   Environmental and seasonal allergies 02/27/2018   H/O cold sores 02/27/2018   Pure hypercholesterolemia 02/27/2018   New onset headache 02/27/2018   Oral herpes simplex infection 01/03/2015   Anxiety 01/30/2013   FIBROCYSTIC BREAST DISEASE 02/10/2009   FATIGUE 02/10/2009    Jule Ser, PT 06/21/2021, 6:20 PM  [facility] @ [place], [place], [address] Phone: [phone]   Fax:  [phone]  Name: Debbie Shaw MRN: [id number] Date of Birth: 30-Dec-1970

## 2022-03-16 ENCOUNTER — Encounter: Payer: Self-pay | Admitting: Gastroenterology

## 2022-06-09 LAB — HM MAMMOGRAPHY

## 2022-07-30 ENCOUNTER — Encounter: Payer: Self-pay | Admitting: Gastroenterology

## 2022-08-27 ENCOUNTER — Ambulatory Visit (AMBULATORY_SURGERY_CENTER): Payer: No Typology Code available for payment source

## 2022-08-27 VITALS — Ht 68.5 in | Wt 180.0 lb

## 2022-08-27 DIAGNOSIS — Z86010 Personal history of colonic polyps: Secondary | ICD-10-CM

## 2022-08-27 DIAGNOSIS — Z800 Family history of malignant neoplasm of digestive organs: Secondary | ICD-10-CM

## 2022-08-27 MED ORDER — NA SULFATE-K SULFATE-MG SULF 17.5-3.13-1.6 GM/177ML PO SOLN: 1.0000 | Freq: Once | ORAL | 0 refills | Status: AC

## 2022-08-27 NOTE — Progress Notes (Signed)
No egg or soy allergy known to patient  No issues known to pt with past sedation with any surgeries or procedures Patient denies ever being told they had issues or difficulty with intubation  No FH of Malignant Hyperthermia Pt is not on diet pills Pt is not on home 02  Pt is not on blood thinners  Pt denies issues with constipation  No A fib or A flutter Have any cardiac testing pending--NO Pt instructed to use Single[employer]com or GoodRx for a price reduction on prep   

## 2022-09-13 ENCOUNTER — Telehealth: Payer: Self-pay | Admitting: Gastroenterology

## 2022-09-13 ENCOUNTER — Encounter: Payer: Self-pay | Admitting: Gastroenterology

## 2022-09-13 NOTE — Telephone Encounter (Signed)
Patient has questions regarding prep instructions and prep medication

## 2022-09-13 NOTE — Telephone Encounter (Signed)
Pt.wanted to make sure the prep was not sulfa based because she has, allergy to sulfa,she stated "if it is what I had last time I did ok with that prep",made pt.aware that she is getting the suprep and it is exactly the same prep that she had in 2018,she also wanted to know how to find instructions for prep in my chart instructed her on how to retrieve instructions and if she has any other concerns to please call us back,all questions answered. Instructions resent via my chart.

## 2022-09-13 NOTE — Telephone Encounter (Signed)
Patient called stated she has a procedure coming up 09/17/22 and she still doe snot have her prep medication. [facility] on [facility].

## 2022-09-13 NOTE — Telephone Encounter (Signed)
Called and spoke with pharmacist who reports the medication was placed back on the shelf as the patient did not pick up medication in a timel;y fashion;   Attempted to reach patient-unable to speak with patient- left message for patient that her prescription has been "refilled" and that she can [facility] from [facility] [place] anytime today;  also left message that the prep would cost patient $61.62 out of pocket as her insurance did not cover medication, however, a GoodRx coupon was used to get the price of the prep as low as possible; (at this pharmacy)  Patient advised to call back to the office at [phone] should questions/concerns arise;

## 2022-09-15 ENCOUNTER — Encounter: Payer: Self-pay | Admitting: Certified Registered Nurse Anesthetist

## 2022-09-16 ENCOUNTER — Encounter: Payer: Self-pay | Admitting: Gastroenterology

## 2022-09-16 ENCOUNTER — Ambulatory Visit: Payer: No Typology Code available for payment source | Admitting: Gastroenterology

## 2022-09-16 VITALS — BP 102/71 | HR 53 | Temp 97.5°F | Resp 20 | Ht 69.0 in | Wt 180.0 lb

## 2022-09-16 DIAGNOSIS — Z86010 Personal history of colonic polyps: Secondary | ICD-10-CM

## 2022-09-16 DIAGNOSIS — Z09 Encounter for follow-up examination after completed treatment for conditions other than malignant neoplasm: Secondary | ICD-10-CM

## 2022-09-16 DIAGNOSIS — D123 Benign neoplasm of transverse colon: Secondary | ICD-10-CM

## 2022-09-16 DIAGNOSIS — K635 Polyp of colon: Secondary | ICD-10-CM | POA: Diagnosis not present

## 2022-09-16 DIAGNOSIS — Z800 Family history of malignant neoplasm of digestive organs: Secondary | ICD-10-CM

## 2022-09-16 MED ORDER — SODIUM CHLORIDE 0.9 % IV SOLN: 500.0000 mL | INTRAVENOUS | Status: DC

## 2022-09-16 NOTE — Progress Notes (Signed)
Report given to PACU, vss 

## 2022-09-16 NOTE — Progress Notes (Signed)
History & Physical  Primary Care Physician:  [facility] Primary Gastroenterologist: Claudette Head, MD  Impression / Plan:  Personal history of adenomatous and sessile serrated colon polyps for surveillance colonoscopy.  CHIEF COMPLAINT:  Personal history of colon polyps   HPI: Debbie Shaw is a 52 y.o. female with a personal history of adenomatous and sessile serrated colon polyps for surveillance colonoscopy.   Past Medical History:  Diagnosis Date   Abnormal Pap smear of cervix    Since 2005 with biopsy under colposcopy   Allergy    Anxiety    Asthma    as child   Depression    GERD (gastroesophageal reflux disease)    Panic attacks    Pure hypercholesterolemia 02/27/2018    Past Surgical History:  Procedure Laterality Date   CHOLECYSTECTOMY  age 67   crying, upset when waking up from anesthesia   FRACTURE SURGERY     KNEE ARTHROSCOPY     bilateral, multiple times   KNEE ARTHROSCOPY W/ ACL RECONSTRUCTION Left    in later 1990s   TONSILLECTOMY      Prior to Admission medications   Medication Sig Start Date End Date Taking? Authorizing Provider  Ascorbic Acid (VITAMIN C PO) Take by mouth.   Yes [provider]  B Complex Vitamins (VITAMIN B COMPLEX) CAPS as directed Orally Once a day   Yes [provider]  busPIRone (BUSPAR) 10 MG tablet TAKE 1 TABLET (10 MG TOTAL) BY MOUTH 3 (THREE) TIMES DAILY. AS NEEDED FOR ANXIETY 01/09/19  Yes [name], NP  Calcium-Magnesium-Vitamin D (CALCIUM MAGNESIUM PO) Take by mouth daily.   Yes [provider]  Cholecalciferol (VITAMIN D PO) Take by mouth daily.   Yes [provider]  estradiol (CLIMARA - DOSED IN MG/24 HR) 0.025 mg/24hr patch 0.025 mg once a week. 07/24/22  Yes [provider]  fluticasone Aleda Grana ALLERGY RELIEF) 50 MCG/ACT nasal spray Flonase   Yes [provider]  levocetirizine (XYZAL) 5 MG tablet Take 1 tablet (5 mg total)  by mouth every evening. 12/22/17  Yes [name], MD  medroxyPROGESTERone (PROVERA) 10 MG tablet 1 tablet with food Orally Once a day for 30 day(s) 10/23/21  Yes [provider]  Omega-3 Fatty Acids (FISH OIL) 300 MG CAPS 1 capsule Orally when remebers   Yes [provider]  polyethylene glycol powder (GLYCOLAX/MIRALAX) powder Take 17 g by mouth 2 (two) times daily as needed. 03/07/18  Yes McVey, Madelaine Bhat, PA-C  Vitamin E 180 MG (400 UNIT) CAPS 1 capsule Orally Once a day   Yes [provider]  Zinc 50 MG TABS 1 tablet Orally Once a day   Yes [provider]  albuterol (VENTOLIN HFA) 108 (90 Base) MCG/ACT inhaler Inhale into the lungs. 06/07/22   [provider]  ibuprofen (ADVIL,MOTRIN) 200 MG tablet Take 200 mg by mouth as needed.    [provider]  montelukast (SINGULAIR) 10 MG tablet TAKE 1 TABLET BY MOUTH EVERYDAY AT BEDTIME Patient not taking: Reported on 08/27/2022 04/11/18   [name], MD  Probiotic Product (PROBIOTIC DAILY PO) Take by mouth.    [provider]  sertraline (ZOLOFT) 100 MG tablet Take 1 tablet (100 mg total) by mouth at bedtime. 05/18/18 05/18/19  [name], MD  valACYclovir (VALTREX) 1000 MG tablet Take 2 tablets (2,000 mg total) by mouth 2 (two) times daily. For 1 day Patient not taking: Reported on 08/27/2022  04/01/20   [name], NP    Current Outpatient Medications  Medication Sig Dispense Refill   Ascorbic Acid (VITAMIN C PO) Take by mouth.     B Complex Vitamins (VITAMIN B COMPLEX) CAPS as directed Orally Once a day     busPIRone (BUSPAR) 10 MG tablet TAKE 1 TABLET (10 MG TOTAL) BY MOUTH 3 (THREE) TIMES DAILY. AS NEEDED FOR ANXIETY 270 tablet 1   Calcium-Magnesium-Vitamin D (CALCIUM MAGNESIUM PO) Take by mouth daily.     Cholecalciferol (VITAMIN D PO) Take by mouth daily.     estradiol (CLIMARA - DOSED IN MG/24 HR) 0.025 mg/24hr patch 0.025 mg once a week.     fluticasone (FLONASE ALLERGY  RELIEF) 50 MCG/ACT nasal spray Flonase     levocetirizine (XYZAL) 5 MG tablet Take 1 tablet (5 mg total) by mouth every evening. 30 tablet 11   medroxyPROGESTERone (PROVERA) 10 MG tablet 1 tablet with food Orally Once a day for 30 day(s)     Omega-3 Fatty Acids (FISH OIL) 300 MG CAPS 1 capsule Orally when remebers     polyethylene glycol powder (GLYCOLAX/MIRALAX) powder Take 17 g by mouth 2 (two) times daily as needed. 578 g 1   Vitamin E 180 MG (400 UNIT) CAPS 1 capsule Orally Once a day     Zinc 50 MG TABS 1 tablet Orally Once a day     albuterol (VENTOLIN HFA) 108 (90 Base) MCG/ACT inhaler Inhale into the lungs.     ibuprofen (ADVIL,MOTRIN) 200 MG tablet Take 200 mg by mouth as needed.     montelukast (SINGULAIR) 10 MG tablet TAKE 1 TABLET BY MOUTH EVERYDAY AT BEDTIME (Patient not taking: Reported on 08/27/2022) 90 tablet 1   Probiotic Product (PROBIOTIC DAILY PO) Take by mouth.     sertraline (ZOLOFT) 100 MG tablet Take 1 tablet (100 mg total) by mouth at bedtime. 90 tablet 3   valACYclovir (VALTREX) 1000 MG tablet Take 2 tablets (2,000 mg total) by mouth 2 (two) times daily. For 1 day (Patient not taking: Reported on 08/27/2022) 20 tablet 2   Current Facility-Administered Medications  Medication Dose Route Frequency Provider Last Rate Last Admin   0.9 %  sodium chloride infusion  500 mL Intravenous Continuous Meryl Dare, MD        Allergies as of 09/16/2022 - Review Complete 09/16/2022  Allergen Reaction Noted   Buprenorphine hcl Nausea And Vomiting 01/01/2015   Morphine and related Nausea And Vomiting 10/22/2011   Other Other (See Comments) 10/22/2011   Sulfa antibiotics Nausea And Vomiting 07/21/2011   Sulfacetamide sodium Nausea And Vomiting 01/01/2015    Family History  Problem Relation Age of Onset   Colon cancer Maternal Grandfather        unknown age   Cancer Maternal Grandfather        colon   Heart disease Maternal Grandmother    Cancer Maternal Grandmother     Thyroid disease Mother    Hyperlipidemia Mother    Irritable bowel syndrome Maternal Uncle        x 3   Irritable bowel syndrome Cousin    Esophageal cancer Neg Hx    Rectal cancer Neg Hx    Stomach cancer Neg Hx     Social History   Socioeconomic History   Marital status: Married    Spouse name: Not on file   Number of children: 3   Years of education: Not on file   Highest education level: Bachelor's degree (  e.g., BA, AB, BS)  Occupational History   Occupation: Teacher, adult education: [employer]   Occupation: Engineer, maintenance (IT): [employer]  Tobacco Use   Smoking status: Never   Smokeless tobacco: Never  Vaping Use   Vaping Use: Never used  Substance and Sexual Activity   Alcohol use: Yes    Comment: 3-10 sometimes all, it depends   Drug use: No   Sexual activity: Yes    Birth control/protection: Surgical  Other Topics Concern   Not on file  Social History Narrative   Lives with her husband and their daughter.  They share custody of his two daughters with their mother. Education: [employer].    Social Determinants of Health   Financial Resource Strain: Low Risk  (12/18/2018)   Overall Financial Resource Strain (CARDIA)    Difficulty of Paying Living Expenses: Not hard at all  Food Insecurity: No Food Insecurity (12/18/2018)   Hunger Vital Sign    Worried About Running Out of Food in the Last Year: Never true    Ran Out of Food in the Last Year: Never true  Transportation Needs: No Transportation Needs (12/18/2018)   PRAPARE - Administrator, Civil Service (Medical): No    Lack of Transportation (Non-Medical): No  Physical Activity: Insufficiently Active (12/18/2018)   Exercise Vital Sign    Days of Exercise per Week: 3 days    Minutes of Exercise per Session: 30 min  Stress: Stress Concern Present (12/18/2018)   [employer] of Occupational Health - Occupational Stress Questionnaire    Feeling of Stress : Rather much  Social  Connections: Unknown (12/18/2018)   Social Connection and Isolation Panel [NHANES]    Frequency of Communication with Friends and Family: Three times a week    Frequency of Social Gatherings with Friends and Family: Twice a week    Attends Religious Services: Patient declined    Database administrator or Organizations: Patient declined    Attends Banker Meetings: Patient declined    Marital Status: Married  Catering manager Violence: Not At Risk (12/18/2018)   Humiliation, Afraid, Rape, and Kick questionnaire    Fear of Current or Ex-Partner: No    Emotionally Abused: No    Physically Abused: No    Sexually Abused: No    Review of Systems:  All systems reviewed were negative except where noted in HPI.   Physical Exam: General:  Alert, well-developed, in NAD Head:  Normocephalic and atraumatic. Eyes:  Sclera clear, no icterus.   Conjunctiva pink. Ears:  Normal auditory acuity. Mouth:  No deformity or lesions.  Neck:  Supple; no masses. Lungs:  Clear throughout to auscultation.   No wheezes, crackles, or rhonchi.  Heart:  Regular rate and rhythm; no murmurs. Abdomen:  Soft, nondistended, nontender. No masses, hepatomegaly. No palpable masses.  Normal bowel sounds.    Rectal:  Deferred   Msk:  Symmetrical without gross deformities. Extremities:  Without edema. Neurologic:  Alert and  oriented x 4; grossly normal neurologically. Skin:  Intact without significant lesions or rashes. Psych:  Alert and cooperative. Normal mood and affect.  Venita Lick. Russella Dar  09/16/2022, 9:51 AM See Loretha Stapler, [facility], to contact our on call provider

## 2022-09-16 NOTE — Progress Notes (Signed)
Patient reports no health changes to health or medications since pre visit.

## 2022-09-16 NOTE — Op Note (Signed)
[facility] Patient Name: Debbie Shaw Procedure Date: 09/16/2022 9:35 AM MRN: [id number] Endoscopist: Meryl Dare , MD, [phone] Age: 52 Referring MD:  Date of Birth: 1971/01/04 Gender: Female Account #: [id number] Procedure:                Colonoscopy Indications:              Surveillance: Personal history of adenomatous and                            sessile serrated polyps on last colonoscopy > 5                            years ago Medicines:                Monitored Anesthesia Care Procedure:                Pre-Anesthesia Assessment:                           - Prior to the procedure, a History and Physical                            was performed, and patient medications and                            allergies were reviewed. The patient's tolerance of                            previous anesthesia was also reviewed. The risks                            and benefits of the procedure and the sedation                            options and risks were discussed with the patient.                            All questions were answered, and informed consent                            was obtained. Prior Anticoagulants: The patient has                            taken no anticoagulant or antiplatelet agents. ASA                            Grade Assessment: II - A patient with mild systemic                            disease. After reviewing the risks and benefits,                            the patient was deemed in satisfactory condition to  undergo the procedure.                           After obtaining informed consent, the colonoscope                            was passed under direct vision. Throughout the                            procedure, the patient's blood pressure, pulse, and                            oxygen saturations were monitored continuously. The                            CF [id number] #[id number] was introduced  through the anus                            and advanced to the the cecum, identified by                            appendiceal orifice and ileocecal valve. The                            ileocecal valve, appendiceal orifice, and rectum                            were photographed. The quality of the bowel                            preparation was good. The colonoscopy was performed                            without difficulty. The patient tolerated the                            procedure well. Scope In: 10:02:11 AM Scope Out: 10:20:14 AM Scope Withdrawal Time: 0 hours 12 minutes 38 seconds  Total Procedure Duration: 0 hours 18 minutes 3 seconds  Findings:                 The perianal and digital rectal examinations were                            normal.                           Two sessile polyps were found in the transverse                            colon. The polyps were 7 mm in size. These polyps                            were removed with a cold snare. Resection and  retrieval were complete.                           External hemorrhoids were found during                            retroflexion. The hemorrhoids were moderate.                           The exam was otherwise without abnormality on                            direct and retroflexion views. Complications:            No immediate complications. Estimated blood loss:                            None. Estimated Blood Loss:     Estimated blood loss: none. Impression:               - Two 7 mm polyps in the transverse colon, removed                            with a cold snare. Resected and retrieved.                           - External hemorrhoids.                           - The examination was otherwise normal on direct                            and retroflexion views. Recommendation:           - Repeat colonoscopy after studies are complete for                            surveillance  based on pathology results.                           - Patient has a contact number available for                            emergencies. The signs and symptoms of potential                            delayed complications were discussed with the                            patient. Return to normal activities tomorrow.                            Written discharge instructions were provided to the                            patient.                           -  Resume previous diet.                           - Continue present medications.                           - Await pathology results. Meryl Dare, MD 09/16/2022 10:23:31 AM This report has been signed electronically.

## 2022-09-16 NOTE — Patient Instructions (Addendum)
Resume previous diet. Continue present medications. Await pathology results.   YOU HAD AN ENDOSCOPIC PROCEDURE TODAY AT THE [facility]:   Refer to the procedure report that was given to you for any specific questions about what was found during the examination.  If the procedure report does not answer your questions, please call your gastroenterologist to clarify.  If you requested that your care partner not be given the details of your procedure findings, then the procedure report has been included in a sealed envelope for you to review at your convenience later.  YOU SHOULD EXPECT: Some feelings of bloating in the abdomen. Passage of more gas than usual.  Walking can help get rid of the air that was put into your GI tract during the procedure and reduce the bloating. If you had a lower endoscopy (such as a colonoscopy or flexible sigmoidoscopy) you may notice spotting of blood in your stool or on the toilet paper. If you underwent a bowel prep for your procedure, you may not have a normal bowel movement for a few days.  Please Note:  You might notice some irritation and congestion in your nose or some drainage.  This is from the oxygen used during your procedure.  There is no need for concern and it should clear up in a day or so.  SYMPTOMS TO REPORT IMMEDIATELY:  Following lower endoscopy (colonoscopy or flexible sigmoidoscopy):  Excessive amounts of blood in the stool  Significant tenderness or worsening of abdominal pains  Swelling of the abdomen that is new, acute  Fever of 100F or higher   For urgent or emergent issues, a gastroenterologist can be reached at any hour by calling [phone]. Do not use MyChart messaging for urgent concerns.    DIET:  We do recommend a small meal at first, but then you may proceed to your regular diet.  Drink plenty of fluids but you should avoid alcoholic beverages for 24 hours.  ACTIVITY:  You should plan to take it easy for the  rest of today and you should NOT DRIVE or use heavy machinery until tomorrow (because of the sedation medicines used during the test).    FOLLOW UP: Our staff will call the number listed on your records the next business day following your procedure.  We will call around 7:15- 8:00 am to check on you and address any questions or concerns that you may have regarding the information given to you following your procedure. If we do not reach you, we will leave a message.     If any biopsies were taken you will be contacted by phone or by letter within the next 1-3 weeks.  Please call us at [phone] if you have not heard about the biopsies in 3 weeks.    SIGNATURES/CONFIDENTIALITY: You and/or your care partner have signed paperwork which will be entered into your electronic medical record.  These signatures attest to the fact that that the information above on your After Visit Summary has been reviewed and is understood.  Full responsibility of the confidentiality of this discharge information lies with you and/or your care-partner. 

## 2022-09-16 NOTE — Progress Notes (Signed)
Called to room to assist during endoscopic procedure.  Patient ID and intended procedure confirmed with present staff. Received instructions for my participation in the procedure from the performing physician.  

## 2022-09-17 ENCOUNTER — Telehealth: Payer: Self-pay | Admitting: *Deleted

## 2022-09-17 NOTE — Telephone Encounter (Signed)
Follow up call attempt.  LVM to call if any questions of concerns.

## 2022-09-27 ENCOUNTER — Encounter: Payer: Self-pay | Admitting: Gastroenterology

## 2022-10-05 ENCOUNTER — Encounter: Payer: Self-pay | Admitting: Cardiovascular Disease

## 2022-10-05 NOTE — Progress Notes (Unsigned)
Cardiology Office Note:    Date:  10/06/2022   ID:  Lezlie Lye, DOB Apr 03, 1971, MRN [id number]  PCP:  [facility]   [facility] Providers Cardiologist:  Yonah Tangeman  Click to update primary MD,subspecialty MD or APP then REFRESH:1}    Referring MD: [facility]*   Chief Complaint  Patient presents with   Palpitations   Chest Pain     History of Present Illness: Oct 06, 2022   Dillyn Nardin is a 52 y.o. female with a hx of of palpitations, costochrondritis. I saw in in June, 2013 for palpitations and atypical CP She has a strong family hx of CAD and wanted to be screened for CAD   Gets anxious at night  Takes buspar to help relax . Is not sleeping well Wakes up with headache,  does not feel rested Does not snore Is getting into menopause   Walks 2+ miles a day 5 days a week,  lifts some weights 2 days a week  Does have some exercised induced coughing which leads to dyspnea   ? Exercised induced asthma   Has had COVID twice Recovered well   + family hx of CAD Grandmother had CAD  Mother some heart issues  Lots of 2nd hand smoke   Past Medical History:  Diagnosis Date   Abnormal Pap smear of cervix    Since 2005 with biopsy under colposcopy   Allergy    Anxiety    Asthma    as child   Depression    GERD (gastroesophageal reflux disease)    Panic attacks    Pure hypercholesterolemia 02/27/2018    Past Surgical History:  Procedure Laterality Date   CHOLECYSTECTOMY  age 80   crying, upset when waking up from anesthesia   FRACTURE SURGERY     KNEE ARTHROSCOPY     bilateral, multiple times   KNEE ARTHROSCOPY W/ ACL RECONSTRUCTION Left    in later 1990s   TONSILLECTOMY      Current Medications: Current Meds  Medication Sig   albuterol (VENTOLIN HFA) 108 (90 Base) MCG/ACT inhaler Inhale into the lungs.   Ascorbic Acid (VITAMIN C PO) Take by mouth.   B Complex Vitamins (VITAMIN B COMPLEX)  CAPS as directed Orally Once a day   busPIRone (BUSPAR) 10 MG tablet TAKE 1 TABLET (10 MG TOTAL) BY MOUTH 3 (THREE) TIMES DAILY. AS NEEDED FOR ANXIETY   Calcium-Magnesium-Vitamin D (CALCIUM MAGNESIUM PO) Take by mouth daily.   estradiol (CLIMARA - DOSED IN MG/24 HR) 0.025 mg/24hr patch 0.025 mg once a week.   fluticasone (FLONASE ALLERGY RELIEF) 50 MCG/ACT nasal spray Flonase   ibuprofen (ADVIL,MOTRIN) 200 MG tablet Take 200 mg by mouth as needed.   levocetirizine (XYZAL) 5 MG tablet Take 1 tablet (5 mg total) by mouth every evening.   medroxyPROGESTERone (PROVERA) 10 MG tablet 1 tablet with food Orally Once a day for 30 day(s)   Omega-3 Fatty Acids (FISH OIL) 300 MG CAPS 1 capsule Orally when remebers   polyethylene glycol powder (GLYCOLAX/MIRALAX) powder Take 17 g by mouth 2 (two) times daily as needed.   Probiotic Product (PROBIOTIC DAILY PO) Take by mouth.   Vitamin E 180 MG (400 UNIT) CAPS 1 capsule Orally Once a day   Zinc 50 MG TABS 1 tablet Orally Once a day     Allergies:   Buprenorphine hcl, Morphine and codeine, Other, Sulfa antibiotics, and Sulfacetamide sodium   Social  History   Socioeconomic History   Marital status: Married    Spouse name: Not on file   Number of children: 3   Years of education: Not on file   Highest education level: Bachelor's degree (e.g., BA, AB, BS)  Occupational History   Occupation: Teacher, adult education: [employer]   Occupation: Engineer, maintenance (IT): [employer]  Tobacco Use   Smoking status: Never   Smokeless tobacco: Never  Vaping Use   Vaping Use: Never used  Substance and Sexual Activity   Alcohol use: Yes    Comment: 3-10 sometimes all, it depends   Drug use: No   Sexual activity: Yes    Birth control/protection: Surgical  Other Topics Concern   Not on file  Social History Narrative   Lives with her husband and their daughter.  They share custody of his two daughters with their mother. Education: [employer].    Social  Determinants of Health   Financial Resource Strain: Low Risk  (12/18/2018)   Overall Financial Resource Strain (CARDIA)    Difficulty of Paying Living Expenses: Not hard at all  Food Insecurity: No Food Insecurity (12/18/2018)   Hunger Vital Sign    Worried About Running Out of Food in the Last Year: Never true    Ran Out of Food in the Last Year: Never true  Transportation Needs: No Transportation Needs (12/18/2018)   PRAPARE - Administrator, Civil Service (Medical): No    Lack of Transportation (Non-Medical): No  Physical Activity: Insufficiently Active (12/18/2018)   Exercise Vital Sign    Days of Exercise per Week: 3 days    Minutes of Exercise per Session: 30 min  Stress: Stress Concern Present (12/18/2018)   [employer] of Occupational Health - Occupational Stress Questionnaire    Feeling of Stress : Rather much  Social Connections: Unknown (12/18/2018)   Social Connection and Isolation Panel [NHANES]    Frequency of Communication with Friends and Family: Three times a week    Frequency of Social Gatherings with Friends and Family: Twice a week    Attends Religious Services: Patient declined    Database administrator or Organizations: Patient declined    Attends Engineer, structural: Patient declined    Marital Status: Married     Family History: The patient's family history includes Cancer in her maternal grandfather and maternal grandmother; Colon cancer in her maternal grandfather; Heart disease in her maternal grandmother; Hyperlipidemia in her mother; Irritable bowel syndrome in her cousin and maternal uncle; Thyroid disease in her mother. There is no history of Esophageal cancer, Rectal cancer, or Stomach cancer.  ROS:   Please see the history of present illness.     All other systems reviewed and are negative.  EKGs/Labs/Other Studies Reviewed:    The following studies were reviewed today:   EKG: Oct 06, 2022: Normal sinus rhythm at 62.   Normal EKG.  Recent Labs: No results found for requested labs within last 365 days.  Recent Lipid Panel    Component Value Date/Time   CHOL 207 (H) 03/27/2020 1053   TRIG 61 03/27/2020 1053   HDL 48 03/27/2020 1053   CHOLHDL 4.3 03/27/2020 1053   CHOLHDL 4.1 01/30/2015 1415   VLDL 14 01/30/2015 1415   LDLCALC 148 (H) 03/27/2020 1053     Risk Assessment/Calculations:                Physical Exam:  VS:  BP 102/68   Pulse 62   Ht 5\' 8"  (1.727 m)   Wt 188 lb 3.2 oz (85.4 kg)   SpO2 95%   BMI 28.62 kg/m     Wt Readings from Last 3 Encounters:  10/06/22 188 lb 3.2 oz (85.4 kg)  09/16/22 180 lb (81.6 kg)  08/27/22 180 lb (81.6 kg)     GEN:  Well nourished, well developed in no acute distress HEENT: Normal NECK: No JVD; No carotid bruits LYMPHATICS: No lymphadenopathy CARDIAC: RRR,   very soft systolic murmur ( benign sounding )  RESPIRATORY:  Clear to auscultation without rales, wheezing or rhonchi  ABDOMEN: Soft, non-tender, non-distended MUSCULOSKELETAL:  No edema; No deformity  SKIN: Warm and dry NEUROLOGIC:  Alert and oriented x 3 PSYCHIATRIC:  Normal affect   ASSESSMENT:    1. Pure hypercholesterolemia   2. Chest pain of uncertain etiology   3. Medication management    PLAN:    In order of problems listed above:  Family history of coronary artery disease: French Ana presents for further evaluation of family history of cardiac disease.  Said several family members have had coronary artery disease.  Will get a coronary calcium score.  Will also draw some baseline labs including lipids, ALT, basic metabolic profile, LP(a).  She denies any angina.  She does have some shortness of breath with exertion and she coughs frequently.  This may be due to exercise-induced asthma.  Will have her try some albuterol inhaler before she exercises to see if this helps with the cough.  Will also refer her to pulmonary medicine.  2.  Benign murmur.  This is either systolic  outflow murmur.  I do not think it serious and I do not think that she needs an echocardiogram at this point.  I will see her in 3 months for follow-up visit.           Medication Adjustments/Labs and Tests Ordered: Current medicines are reviewed at length with the patient today.  Concerns regarding medicines are outlined above.  Orders Placed This Encounter  Procedures   CT CARDIAC SCORING (SELF PAY ONLY)   Lipid panel   ALT   Basic metabolic panel   Lipoprotein A (LPA)   Ambulatory referral to Pulmonology   EKG 12-Lead   No orders of the defined types were placed in this encounter.    Patient Instructions  Medication Instructions:  Your physician recommends that you continue on your current medications as directed. Please refer to the Current Medication list given to you today.  *If you need a refill on your cardiac medications before your next appointment, please call your pharmacy*  Lab Work: TODAY: Lipids, ALT, BMET, LPa If you have labs (blood work) drawn today and your tests are completely normal, you will receive your results only by: MyChart Message (if you have MyChart) OR A paper copy in the mail If you have any lab test that is abnormal or we need to change your treatment, we will call you to review the results.  Testing/Procedures: Your physician has requested that you have a calcium score CT scan. There is a $99 fee for the scan.   Follow-Up: At [facility], you and your health needs are our priority.  As part of our continuing mission to provide you with exceptional heart care, we have created designated Provider Care Teams.  These Care Teams include your primary Cardiologist (physician) and Advanced Practice Providers (APPs -  Physician Assistants and  Nurse Practitioners) who all work together to provide you with the care you need, when you need it.  Your next appointment:   3 month(s)  Provider:   Dr. Elease Hashimoto   Signed, Kristeen Miss, MD   10/06/2022 1:45 PM    [facility]

## 2022-10-06 ENCOUNTER — Encounter: Payer: Self-pay | Admitting: Cardiovascular Disease

## 2022-10-06 ENCOUNTER — Ambulatory Visit
Payer: No Typology Code available for payment source | Attending: Cardiovascular Disease | Admitting: Cardiovascular Disease

## 2022-10-06 VITALS — BP 102/68 | HR 62 | Ht 68.0 in | Wt 188.2 lb

## 2022-10-06 DIAGNOSIS — Z79899 Other long term (current) drug therapy: Secondary | ICD-10-CM | POA: Diagnosis not present

## 2022-10-06 DIAGNOSIS — R079 Chest pain, unspecified: Secondary | ICD-10-CM

## 2022-10-06 DIAGNOSIS — E7800 Pure hypercholesterolemia, unspecified: Secondary | ICD-10-CM

## 2022-10-06 NOTE — Patient Instructions (Signed)
Medication Instructions:  Your physician recommends that you continue on your current medications as directed. Please refer to the Current Medication list given to you today.  *If you need a refill on your cardiac medications before your next appointment, please call your pharmacy*  Lab Work: TODAY: Lipids, ALT, BMET, LPa If you have labs (blood work) drawn today and your tests are completely normal, you will receive your results only by: MyChart Message (if you have MyChart) OR A paper copy in the mail If you have any lab test that is abnormal or we need to change your treatment, we will call you to review the results.  Testing/Procedures: Your physician has requested that you have a calcium score CT scan. There is a $99 fee for the scan.   Follow-Up: At [facility], you and your health needs are our priority.  As part of our continuing mission to provide you with exceptional heart care, we have created designated Provider Care Teams.  These Care Teams include your primary Cardiologist (physician) and Advanced Practice Providers (APPs -  Physician Assistants and Nurse Practitioners) who all work together to provide you with the care you need, when you need it.  Your next appointment:   3 month(s)  Provider:   Dr. Elease Hashimoto

## 2022-10-07 LAB — LIPOPROTEIN A (LPA)

## 2022-10-08 ENCOUNTER — Encounter: Payer: Self-pay | Admitting: Cardiovascular Disease

## 2022-10-08 LAB — LIPID PANEL
Chol/HDL Ratio: 4.4 ratio (ref 0.0–4.4)
Cholesterol, Total: 252 mg/dL — ABNORMAL HIGH (ref 100–199)
HDL: 57 mg/dL (ref >39)
LDL Chol Calc (NIH): 165 mg/dL — ABNORMAL HIGH (ref 0–99)
Triglycerides: 167 mg/dL — ABNORMAL HIGH (ref 0–149)
VLDL Cholesterol Cal: 30 mg/dL (ref 5–40)

## 2022-10-08 LAB — BASIC METABOLIC PANEL
BUN/Creatinine Ratio: 19 (ref 9–23)
BUN: 19 mg/dL (ref 6–24)
CO2: 25 mmol/L (ref 20–29)
Calcium: 9.7 mg/dL (ref 8.7–10.2)
Chloride: 101 mmol/L (ref 96–106)
Creatinine, Ser: 1 mg/dL (ref 0.57–1.00)
Glucose: 84 mg/dL (ref 70–99)
Potassium: 4.5 mmol/L (ref 3.5–5.2)
Sodium: 140 mmol/L (ref 134–144)
eGFR: 69 mL/min/{1.73_m2} (ref >59)

## 2022-10-08 LAB — ALT: ALT: 12 IU/L (ref 0–32)

## 2022-11-16 ENCOUNTER — Encounter: Payer: Self-pay | Admitting: Internal Medicine

## 2022-11-16 ENCOUNTER — Ambulatory Visit (INDEPENDENT_AMBULATORY_CARE_PROVIDER_SITE_OTHER): Payer: No Typology Code available for payment source | Admitting: Internal Medicine

## 2022-11-16 ENCOUNTER — Ambulatory Visit (HOSPITAL_BASED_OUTPATIENT_CLINIC_OR_DEPARTMENT_OTHER)
Admission: RE | Admit: 2022-11-16 | Discharge: 2022-11-16 | Disposition: A | Discharge status: Home / Self Care | Payer: Self-pay | Source: Ambulatory Visit | Attending: Cardiovascular Disease | Admitting: Cardiovascular Disease

## 2022-11-16 VITALS — BP 126/74 | HR 70 | Temp 97.8°F | Ht 68.0 in | Wt 189.4 lb

## 2022-11-16 DIAGNOSIS — R079 Chest pain, unspecified: Secondary | ICD-10-CM | POA: Insufficient documentation

## 2022-11-16 DIAGNOSIS — E7800 Pure hypercholesterolemia, unspecified: Secondary | ICD-10-CM | POA: Insufficient documentation

## 2022-11-16 DIAGNOSIS — J301 Allergic rhinitis due to pollen: Secondary | ICD-10-CM

## 2022-11-16 DIAGNOSIS — J4540 Moderate persistent asthma, uncomplicated: Secondary | ICD-10-CM | POA: Diagnosis not present

## 2022-11-16 DIAGNOSIS — Z79899 Other long term (current) drug therapy: Secondary | ICD-10-CM | POA: Insufficient documentation

## 2022-11-16 LAB — CBC WITH DIFFERENTIAL/PLATELET
Basophils Absolute: 0.1 10*3/uL (ref 0.0–0.1)
Basophils Relative: 1 % (ref 0.0–3.0)
Eosinophils Absolute: 0.5 10*3/uL (ref 0.0–0.7)
Eosinophils Relative: 8.4 % — ABNORMAL HIGH (ref 0.0–5.0)
HCT: 40.2 % (ref 36.0–46.0)
Hemoglobin: 13.7 g/dL (ref 12.0–15.0)
Lymphocytes Relative: 19.1 % (ref 12.0–46.0)
Lymphs Abs: 1.2 10*3/uL (ref 0.7–4.0)
MCHC: 34.1 g/dL (ref 30.0–36.0)
MCV: 89.3 fl (ref 78.0–100.0)
Monocytes Absolute: 0.5 10*3/uL (ref 0.1–1.0)
Monocytes Relative: 7.2 % (ref 3.0–12.0)
Neutro Abs: 4.2 10*3/uL (ref 1.4–7.7)
Neutrophils Relative %: 64.3 % (ref 43.0–77.0)
Platelets: 261 10*3/uL (ref 150.0–400.0)
RBC: 4.5 Mil/uL (ref 3.87–5.11)
RDW: 13.2 % (ref 11.5–15.5)
WBC: 6.5 10*3/uL (ref 4.0–10.5)

## 2022-11-16 LAB — POCT EXHALED NITRIC OXIDE: FeNO level (ppb): 40

## 2022-11-16 MED ORDER — AZELASTINE-FLUTICASONE 137-50 MCG/ACT NA SUSP: 1.0000 | Freq: Every day | NASAL | 5 refills | Status: DC

## 2022-11-16 MED ORDER — FLUTICASONE-SALMETEROL 115-21 MCG/ACT IN AERO: 2.0000 | INHALATION_SPRAY | Freq: Two times a day (BID) | RESPIRATORY_TRACT | 12 refills | Status: DC

## 2022-11-16 NOTE — Progress Notes (Signed)
Debbie Shaw    [id number]    1971/05/03  Primary Care Physician:Pa, [facility]  Referring Physician: [facility] [address] [place],  [place] [address] Reason for Consultation: shortness of breath Date of Consultation: 11/16/2022  Chief complaint:   Chief Complaint  Patient presents with   Consult    Chronic cough, Sob with exercise      HPI: Lakyn Alsteen is a 52 y.o. woman who presents for new patient evaluation for shortness of breath, chest pain.  She is referred at the request of Dr. Elease Hashimoto. She does have a family history of CAD and is due later today for coronary calcium score scan.   Symptoms are almost always with exertion and including coughing and shortness of breath. Sometimes with changes in temperature (such as walking out of a store from outside to inside.)  Symptoms have been going on for years - but seem to be progressing which is why she's presenting for care.  She had childhood asthma. She did play sports in high school and use albuterol inhaler with exercise.   She was given an albuterol inhaler by Dr. Elease Hashimoto and has taken it twice and it does help.  She does walk 2 miles almost every day with the dog. She gets "sick 2-3 times" and usually needs steroids about once a year. Never been hospitalized but did have ED visits as a child.   She does have seasonal allergies. Takes zyrtec daily and flonase as needed.has been on both of these for years. She is still having breakthrough symptoms. Had allergy testing many years ago and had extensive environmental allergies.   She is allergic to dogs and lives with one, but doesn't think dog is contributing to symptoms.   She avoids feather pillows, has hardwood floors in her house. does  Social history:  Occupation: Stage manager - mixed media.  Exposures: lives with husband and dog.  Smoking history: never smoker, passive  smoke exposure in childhood  Social History   Occupational History   Occupation: Teacher, adult education: [employer]   Occupation: Engineer, maintenance (IT): [employer]  Tobacco Use   Smoking status: Never    Passive exposure: Past   Smokeless tobacco: Never  Vaping Use   Vaping Use: Never used  Substance and Sexual Activity   Alcohol use: Yes    Comment: 3-10 sometimes all, it depends   Drug use: No   Sexual activity: Yes    Birth control/protection: Surgical    Relevant family history:  Family History  Problem Relation Age of Onset   Colon cancer Maternal Grandfather        unknown age   Cancer Maternal Grandfather        colon   Heart disease Maternal Grandmother    Cancer Maternal Grandmother    Thyroid disease Mother    Hyperlipidemia Mother    Irritable bowel syndrome Maternal Uncle        x 3   Irritable bowel syndrome Cousin    Esophageal cancer Neg Hx    Rectal cancer Neg Hx    Stomach cancer Neg Hx     Past Medical History:  Diagnosis Date   Abnormal Pap smear of cervix    Since 2005 with biopsy under colposcopy   Allergy    Anxiety    Asthma    as child  Depression    GERD (gastroesophageal reflux disease)    Panic attacks    Pure hypercholesterolemia 02/27/2018    Past Surgical History:  Procedure Laterality Date   CHOLECYSTECTOMY  age 83   crying, upset when waking up from anesthesia   FRACTURE SURGERY     KNEE ARTHROSCOPY     bilateral, multiple times   KNEE ARTHROSCOPY W/ ACL RECONSTRUCTION Left    in later 1990s   TONSILLECTOMY       Physical Exam: Blood pressure 126/74, pulse 70, temperature 97.8 F (36.6 C), temperature source Oral, height 5\' 8"  (1.727 m), weight 189 lb 6.4 oz (85.9 kg), SpO2 90 %. Gen:      No acute distress ENT:  mallampati IV, no nasal polyps, mucus membranes moist Lungs:    No increased respiratory effort, symmetric chest wall excursion, clear to auscultation bilaterally, no wheezes or  crackles CV:         Regular rate and rhythm; no murmurs, rubs, or gallops.  No pedal edema Abd:      + bowel sounds; soft, non-tender; no distension MSK: no acute synovitis of DIP or PIP joints, no mechanics hands.  Skin:      Warm and dry; no rashes Neuro: normal speech, no focal facial asymmetry Psych: alert and oriented x3, normal mood and affect   Data Reviewed/Medical Decision Making:  Independent interpretation of tests: Imaging:  Review of patient's chest xray 2020 images revealed no acute pulmonary process. The patient's images have been independently reviewed by me.    PFTs: Labs: peripheral eosinophilia in CBCs from 2021.   Immunization status:  Immunization History  Administered Date(s) Administered   Influenza Whole 03/14/2009   Influenza, Seasonal, Injecte, Preservative Fre 01/30/2013   Influenza,inj,Quad PF,6+ Mos 01/30/2015, 01/15/2018, 04/01/2020   Influenza-Unspecified 02/12/2016, 02/01/2017, 02/05/2018   PFIZER(Purple Top)SARS-COV-2 Vaccination 08/17/2019, 09/10/2019   Tdap 12/17/2016     I reviewed prior external note(s) from cardiology  I reviewed the result(s) of the labs and imaging as noted above.   I have ordered region 2 allergy panel, feno   Assessment:  Moderate persistent asthma, not well controlled Allergic rhinitis, seasonal  Plan/Recommendations:  Start advair 2 puffs twice daily, gargle after use.   Switch from flonase to dymista nasal spray  Take the albuterol rescue inhaler every 4 to 6 hours as needed for wheezing or shortness of breath.  We discussed disease management and progression at length today.    Return to Care: Return in about 3 months (around 02/16/2023).  Durel Salts, MD Pulmonary and Critical Care Medicine [facility] Office:[phone]  CC: Pa, [facility]*

## 2022-11-16 NOTE — Patient Instructions (Addendum)
Please schedule follow up scheduled with myself in 3 months.  If my schedule is not open yet, we will contact you with a reminder closer to that time. Please call [phone] if you haven't heard from [place] a month before.   Start advair 2 puffs twice daily, gargle after use.  Switch from flonase to dymista nasal spray Continue zyrtec, can alternate with xyzal as well.  We will get some blood work today.  Take the albuterol rescue inhaler every 4 to 6 hours as needed for wheezing or shortness of breath. You can also take it 15 minutes before exercise or exertional activity. Side effects include heart racing or pounding, jitters or anxiety. If you have a history of an irregular heart rhythm, it can make this worse. Can also give some patients a hard time sleeping.   Dymista - 1 spray on each side of your nose twice a day for first week, then 1 spray on each side.   Instructions for use: If you also use a saline nasal spray or rinse, use that first. Position the head with the chin slightly tucked. Use the right hand to spray into the left nostril and the right hand to spray into the left nostril.   Point the bottle away from the septum of your nose (cartilage that divides the two sides of your nose).  Hold the nostril closed on the opposite side from where you will spray Spray once and gently sniff to pull the medicine into the higher parts of your nose.  Don't sniff too hard as the medicine will drain down the back of your throat instead. Repeat with a second spray on the same side if prescribed. Repeat on the other side of your nose.  By learning about asthma and how it can be controlled, you take an important step toward managing this disease. Work closely with your asthma care team to learn all you can about your asthma, how to avoid triggers, what your medications do, and how to take them correctly. With proper care, you can live free of asthma symptoms and maintain a normal, healthy  lifestyle.   What is asthma? Asthma is a chronic disease that affects the airways of the lungs. During normal breathing, the bands of muscle that surround the airways are relaxed and air moves freely. During an asthma episode or "attack," there are three main changes that stop air from moving easily through the airways: The bands of muscle that surround the airways tighten and make the airways narrow. This tightening is called bronchospasm.  The lining of the airways becomes swollen or inflamed.  The cells that line the airways produce more mucus, which is thicker than normal and clogs the airways.  These three factors - bronchospasm, inflammation, and mucus production - cause symptoms such as difficulty breathing, wheezing, and coughing.  What are the most common symptoms of asthma? Asthma symptoms are not the same for everyone. They can even change from episode to episode in the same person. Also, you may have only one symptom of asthma, such as cough, but another person may have all the symptoms of asthma. It is important to know all the symptoms of asthma and to be aware that your asthma can present in any of these ways at any time. The most common symptoms include: Coughing, especially at night  Shortness of breath  Wheezing  Chest tightness, pain, or pressure   Who is affected by asthma? Asthma affects 22 million Americans; about 6  million of these are children under age 22. People who have a family history of asthma have an increased risk of developing the disease. Asthma is also more common in people who have allergies or who are exposed to tobacco smoke. However, anyone can develop asthma at any time. Some people may have asthma all of their lives, while others may develop it as adults.  What causes asthma? The airways in a person with asthma are very sensitive and react to many things, or "triggers." Contact with these triggers causes asthma symptoms. One of the most important parts  of asthma control is to identify your triggers and then avoid them when possible. The only trigger you do not want to avoid is exercise. Pre-treatment with medicines before exercise can allow you to stay active yet avoid asthma symptoms. Common asthma triggers include: Infections (colds, viruses, flu, sinus infections)  Exercise  Weather (changes in temperature and/or humidity, cold air)  Tobacco smoke  Allergens (dust mites, pollens, pets, mold spores, cockroaches, and sometimes foods)  Irritants (strong odors from cleaning products, perfume, wood smoke, air pollution)  Strong emotions such as crying or laughing hard  Some medications   How is asthma diagnosed? To diagnose asthma, your doctor will first review your medical history, family history, and symptoms. Your doctor will want to know any past history of breathing problems you may have had, as well as a family history of asthma, allergies, eczema (a bumpy, itchy skin rash caused by allergies), or other lung disease. It is important that you describe your symptoms in detail (cough, wheeze, shortness of breath, chest tightness), including when and how often they occur. The doctor will perform a physical examination and listen to your heart and lungs. He or she may also order breathing tests, allergy tests, blood tests, and chest and sinus X-rays. The tests will find out if you do have asthma and if there are any other conditions that are contributing factors.  How is asthma treated? Asthma can be controlled, but not cured. It is not normal to have frequent symptoms, trouble sleeping, or trouble completing tasks. Appropriate asthma care will prevent symptoms and visits to the emergency room and hospital. Asthma medicines are one of the mainstays of asthma treatment. The drugs used to treat asthma are explained below.  Anti-inflammatories: These are the most important drugs for most people with asthma. Anti-inflammatory drugs reduce swelling and  mucus production in the airways. As a result, airways are less sensitive and less likely to react to triggers. These medications need to be taken daily and may need to be taken for several weeks before they begin to control asthma. Anti-inflammatory medicines lead to fewer symptoms, better airflow, less sensitive airways, less airway damage, and fewer asthma attacks. If taken every day, they CONTROL or prevent asthma symptoms.   Bronchodilators: These drugs relax the muscle bands that tighten around the airways. This action opens the airways, letting more air in and out of the lungs and improving breathing. Bronchodilators also help clear mucus from the lungs. As the airways open, the mucus moves more freely and can be coughed out more easily. In short-acting forms, bronchodilators RELIEVE or stop asthma symptoms by quickly opening the airways and are very helpful during an asthma episode. In long-acting forms, bronchodilators provide CONTROL of asthma symptoms and prevent asthma episodes.  Asthma drugs can be taken in a variety of ways. Inhaling the medications by using a metered dose inhaler, dry powder inhaler, or nebulizer is one way  of taking asthma medicines. Oral medicines (pills or liquids you swallow) may also be prescribed.  Asthma severity Asthma is classified as either "intermittent" (comes and goes) or "persistent" (lasting). Persistent asthma is further described as being mild, moderate, or severe. The severity of asthma is based on how often you have symptoms both during the day and night, as well as by the results of lung function tests and by how well you can perform activities. The "severity" of asthma refers to how "intense" or "strong" your asthma is.  Asthma control Asthma control is the goal of asthma treatment. Regardless of your asthma severity, it may or may not be controlled. Asthma control means: You are able to do everything you want to do at work and home  You have no (or  minimal) asthma symptoms  You do not wake up from your sleep or earlier than usual in the morning due to asthma  You rarely need to use your reliever medicine (inhaler)  Another major part of your treatment is that you are happy with your asthma care and believe your asthma is controlled.  Monitoring symptoms A key part of treatment is keeping track of how well your lungs are working. Monitoring your symptoms, what they are, how and when they happen, and how severe they are, is an important part of being able to control your asthma.  Sometimes asthma is monitored using a peak flow meter. A peak flow (PF) meter measures how fast the air comes out of your lungs. It can help you know when your asthma is getting worse, sometimes even before you have symptoms. By taking daily peak flow readings, you can learn when to adjust medications to keep asthma under good control. It is also used to create your asthma action plan (see below). Your doctor can use your peak flow readings to adjust your treatment plan in some cases.  Asthma Action Plan Based on your history and asthma severity, you and your doctor will develop a care plan called an "asthma action plan." The asthma action plan describes when and how to use your medicines, actions to take when asthma worsens, and when to seek emergency care. Make sure you understand this plan. If you do not, ask your asthma care provider any questions you may have. Your asthma action plan is one of the keys to controlling asthma. Keep it readily available to remind you of what you need to do every day to control asthma and what you need to do when symptoms occur.  Goals of asthma therapy These are the goals of asthma treatment: Live an active, normal life  Prevent chronic and troublesome symptoms  Attend work or school every day  Perform daily activities without difficulty  Stop urgent visits to the doctor, emergency department, or hospital  Use and adjust  medications to control asthma with few or no side effects

## 2022-11-17 LAB — RESPIRATORY ALLERGY PROFILE REGION II ~~LOC~~
Allergen, A. alternata, m6: <0.1 kU/L
Allergen, Cedar tree, t12: 0.2 kU/L — ABNORMAL HIGH
Allergen, Comm Silver Birch, t9: <0.1 kU/L
Allergen, Cottonwood, t14: <0.1 kU/L
Allergen, D pternoyssinus,d7: 1.35 kU/L — ABNORMAL HIGH
Allergen, Mouse Urine Protein, e78: <0.1 kU/L
Allergen, Mulberry, t76: <0.1 kU/L
Allergen, Oak,t7: <0.1 kU/L
Allergen, P. notatum, m1: <0.1 kU/L
Aspergillus fumigatus, m3: <0.1 kU/L
Bermuda Grass: <0.1 kU/L
Box Elder IgE: <0.1 kU/L
CLADOSPORIUM HERBARUM (M2) IGE: <0.1 kU/L
COMMON RAGWEED (SHORT) (W1) IGE: <0.1 kU/L
Cat Dander: <0.1 kU/L
Class: 0
Class: 0
Class: 0
Class: 0
Class: 0
Class: 0
Class: 0
Class: 0
Class: 0
Class: 0
Class: 0
Class: 0
Class: 0
Class: 0
Class: 0
Class: 0
Class: 0
Class: 0
Class: 0
Class: 0
Class: 0
Class: 2
Class: 2
Cockroach: <0.1 kU/L
D. farinae: 1.24 kU/L — ABNORMAL HIGH
Dog Dander: <0.1 kU/L
Elm IgE: <0.1 kU/L
IgE (Immunoglobulin E), Serum: 15 kU/L (ref <=114)
Johnson Grass: <0.1 kU/L
Pecan/Hickory Tree IgE: <0.1 kU/L
Rough Pigweed  IgE: <0.1 kU/L
Sheep Sorrel IgE: <0.1 kU/L
Timothy Grass: <0.1 kU/L

## 2022-11-17 LAB — INTERPRETATION:

## 2022-11-20 ENCOUNTER — Other Ambulatory Visit: Payer: Self-pay | Admitting: Internal Medicine

## 2022-11-24 ENCOUNTER — Other Ambulatory Visit: Payer: Self-pay | Admitting: Internal Medicine

## 2022-11-30 ENCOUNTER — Other Ambulatory Visit (HOSPITAL_COMMUNITY): Payer: Self-pay

## 2022-11-30 NOTE — Telephone Encounter (Signed)
PA not needed at this time as Brand Advair is showing covered. Patient does have a deductible to meet as well making the co-pay $138.96

## 2022-12-01 MED ORDER — ADVAIR HFA 115-21 MCG/ACT IN AERO: 2.0000 | INHALATION_SPRAY | Freq: Two times a day (BID) | RESPIRATORY_TRACT | 12 refills | Status: DC

## 2022-12-01 NOTE — Telephone Encounter (Signed)
Sent the brand only since this is what is covered.

## 2022-12-21 ENCOUNTER — Telehealth: Payer: Self-pay

## 2022-12-21 ENCOUNTER — Other Ambulatory Visit (HOSPITAL_COMMUNITY): Payer: Self-pay

## 2022-12-21 NOTE — Telephone Encounter (Signed)
*  [facility]  Pharmacy Patient Advocate Encounter   Received notification from Fax that prior authorization for Azelastine-Fluticasone 137-50MCG/ACT suspension  is required/requested.   Insurance verification completed.   The patient is insured through [facility] .   Per test claim: PA required; PA submitted to [facility] via CoverMyMeds Key/confirmation #/EOC [id number] Status is pending

## 2022-12-22 NOTE — Telephone Encounter (Signed)
Fax received from plan, filled out, and faxed back to plan.

## 2022-12-27 ENCOUNTER — Encounter: Payer: Self-pay | Admitting: Cardiovascular Disease

## 2022-12-27 NOTE — Progress Notes (Signed)
Cardiology Office Note:    Date:  12/27/2022   ID:  Debbie Shaw, DOB 1971/05/23, MRN [id number]  PCP:  [facility]   [facility] Providers Cardiologist:  Jakala Herford  Click to update primary MD,subspecialty MD or APP then REFRESH:1}    Referring MD: [facility]   Chief Complaint  Patient presents with   Hyperlipidemia     History of Present Illness: Oct 06, 2022   Debbie Shaw is a 52 y.o. female with a hx of of palpitations, costochrondritis. I saw in in June, 2013 for palpitations and atypical CP She has a strong family hx of CAD and wanted to be screened for CAD   Gets anxious at night  Takes buspar to help relax . Is not sleeping well Wakes up with headache,  does not feel rested Does not snore Is getting into menopause   Walks 2+ miles a day 5 days a week,  lifts some weights 2 days a week  Does have some exercised induced coughing which leads to dyspnea   ? Exercised induced asthma   Has had COVID twice Recovered well   + family hx of CAD Grandmother had CAD  Mother some heart issues  Lots of 2nd hand smoke   Aug. 6, 2024 Debbie Shaw is seen for follow up of her HLD CAC score on November 16, 2022 was 0   Past Medical History:  Diagnosis Date   Abnormal Pap smear of cervix    Since 2005 with biopsy under colposcopy   Allergy    Anxiety    Asthma    as child   Depression    GERD (gastroesophageal reflux disease)    Panic attacks    Pure hypercholesterolemia 02/27/2018    Past Surgical History:  Procedure Laterality Date   CHOLECYSTECTOMY  age 76   crying, upset when waking up from anesthesia   FRACTURE SURGERY     KNEE ARTHROSCOPY     bilateral, multiple times   KNEE ARTHROSCOPY W/ ACL RECONSTRUCTION Left    in later 1990s   TONSILLECTOMY      Current Medications: No outpatient medications have been marked as taking for the 12/28/22 encounter (Office Visit) with Tinamarie Przybylski, Deloris Ping, MD.      Allergies:   Buprenorphine hcl, Morphine and codeine, Other, Sulfa antibiotics, and Sulfacetamide sodium   Social History   Socioeconomic History   Marital status: Married    Spouse name: Not on file   Number of children: 3   Years of education: Not on file   Highest education level: Bachelor's degree (e.g., BA, AB, BS)  Occupational History   Occupation: Teacher, adult education: [employer]   Occupation: Engineer, maintenance (IT): [employer]  Tobacco Use   Smoking status: Never    Passive exposure: Past   Smokeless tobacco: Never  Vaping Use   Vaping status: Never Used  Substance and Sexual Activity   Alcohol use: Yes    Comment: 3-10 sometimes all, it depends   Drug use: No   Sexual activity: Yes    Birth control/protection: Surgical  Other Topics Concern   Not on file  Social History Narrative   Lives with her husband and their daughter.  They share custody of his two daughters with their mother. Education: [employer].    Social Determinants of Health   Financial Resource Strain: Low Risk  (12/18/2018)   Overall Financial Resource  Strain (CARDIA)    Difficulty of Paying Living Expenses: Not hard at all  Food Insecurity: No Food Insecurity (12/18/2018)   Hunger Vital Sign    Worried About Running Out of Food in the Last Year: Never true    Ran Out of Food in the Last Year: Never true  Transportation Needs: No Transportation Needs (12/18/2018)   PRAPARE - Administrator, Civil Service (Medical): No    Lack of Transportation (Non-Medical): No  Physical Activity: Insufficiently Active (12/18/2018)   Exercise Vital Sign    Days of Exercise per Week: 3 days    Minutes of Exercise per Session: 30 min  Stress: Stress Concern Present (12/18/2018)   [employer] of Occupational Health - Occupational Stress Questionnaire    Feeling of Stress : Rather much  Social Connections: Unknown (12/18/2018)   Social Connection and Isolation Panel [NHANES]     Frequency of Communication with Friends and Family: Three times a week    Frequency of Social Gatherings with Friends and Family: Twice a week    Attends Religious Services: Patient declined    Database administrator or Organizations: Patient declined    Attends Engineer, structural: Patient declined    Marital Status: Married     Family History: The patient's family history includes Cancer in her maternal grandfather and maternal grandmother; Colon cancer in her maternal grandfather; Heart disease in her maternal grandmother; Hyperlipidemia in her mother; Irritable bowel syndrome in her cousin and maternal uncle; Thyroid disease in her mother. There is no history of Esophageal cancer, Rectal cancer, or Stomach cancer.  ROS:   Please see the history of present illness.     All other systems reviewed and are negative.  EKGs/Labs/Other Studies Reviewed:    The following studies were reviewed today:   EKG:    Recent Labs: 10/06/2022: ALT 12; BUN 19; Creatinine, Ser 1.00; Potassium 4.5; Sodium 140 11/16/2022: Hemoglobin 13.7; Platelets 261.0  Recent Lipid Panel    Component Value Date/Time   CHOL 252 (H) 10/06/2022 1238   TRIG 167 (H) 10/06/2022 1238   HDL 57 10/06/2022 1238   CHOLHDL 4.4 10/06/2022 1238   CHOLHDL 4.1 01/30/2015 1415   VLDL 14 01/30/2015 1415   LDLCALC 165 (H) 10/06/2022 1238     Risk Assessment/Calculations:        Physical Exam:     Physical Exam: There were no vitals taken for this visit.  No BP recorded.  {Refresh Note OR Click here to enter BP  :1}***    GEN:  Well nourished, well developed in no acute distress HEENT: Normal NECK: No JVD; No carotid bruits LYMPHATICS: No lymphadenopathy CARDIAC: RRR ***, no murmurs, rubs, gallops RESPIRATORY:  Clear to auscultation without rales, wheezing or rhonchi  ABDOMEN: Soft, non-tender, non-distended MUSCULOSKELETAL:  No edema; No deformity  SKIN: Warm and dry NEUROLOGIC:  Alert and oriented  x 3    ASSESSMENT:    No diagnosis found.  PLAN:      Family history of coronary artery disease: .  2.  Benign murmur.     I will see her in 3 months for follow-up visit.           Medication Adjustments/Labs and Tests Ordered: Current medicines are reviewed at length with the patient today.  Concerns regarding medicines are outlined above.  No orders of the defined types were placed in this encounter.  No orders of the defined types were placed in this encounter.  There are no Patient Instructions on file for this visit.   Signed, Kristeen Miss, MD  12/27/2022 8:44 PM    [facility]

## 2022-12-27 NOTE — Telephone Encounter (Signed)
Pharmacy Patient Advocate Encounter  Received notification from [facility] that Prior Authorization for Azelastine-Fluticasone 137-50MCG/ACT suspension has been DENIED. Please advise how you'd like to proceed. Full denial letter will be uploaded to the media tab. See denial reason below.  PA #/Case ID/Reference #: [id number]   The request for coverage for AZEL/FLUTIC SPR 137-50, use as directed (23 per prescription), is denied. This decision is based on health plan criteria for AZEL/FLUTIC SPR 137-50. This medicine is covered only if: All of the following: (1) Your doctor submits medical records documenting that you have had an inadequate response, failed, or cannot use fluticasone (generic Flonase) used in combination with azelastine (generic Astelin) (date and duration of trial must be provided). (2) Your doctor provides an explanation for how the requested medication will work for you if you have failed on the covered therapeutically equivalent product which has the same active ingredient (for example, you had an adverse reaction to an inactive ingredient in the alternative product).

## 2022-12-28 ENCOUNTER — Encounter: Payer: Self-pay | Admitting: Cardiovascular Disease

## 2022-12-28 ENCOUNTER — Ambulatory Visit
Payer: No Typology Code available for payment source | Attending: Cardiovascular Disease | Admitting: Cardiovascular Disease

## 2023-01-30 ENCOUNTER — Encounter: Payer: Self-pay | Admitting: Internal Medicine

## 2023-01-31 ENCOUNTER — Ambulatory Visit (INDEPENDENT_AMBULATORY_CARE_PROVIDER_SITE_OTHER): Payer: No Typology Code available for payment source | Admitting: Internal Medicine

## 2023-01-31 ENCOUNTER — Encounter: Payer: Self-pay | Admitting: Internal Medicine

## 2023-01-31 ENCOUNTER — Ambulatory Visit: Payer: No Typology Code available for payment source

## 2023-01-31 VITALS — BP 118/74 | HR 61 | Ht 68.0 in | Wt 190.4 lb

## 2023-01-31 DIAGNOSIS — R053 Chronic cough: Secondary | ICD-10-CM | POA: Diagnosis not present

## 2023-01-31 DIAGNOSIS — J4530 Mild persistent asthma, uncomplicated: Secondary | ICD-10-CM | POA: Insufficient documentation

## 2023-01-31 DIAGNOSIS — J3089 Other allergic rhinitis: Secondary | ICD-10-CM | POA: Diagnosis not present

## 2023-01-31 MED ORDER — BENZONATATE 200 MG PO CAPS: 200.0000 mg | ORAL_CAPSULE | Freq: Three times a day (TID) | ORAL | 1 refills | Status: DC | PRN

## 2023-01-31 MED ORDER — SPIRIVA RESPIMAT 1.25 MCG/ACT IN AERS: 2.0000 | INHALATION_SPRAY | Freq: Every day | RESPIRATORY_TRACT | 0 refills | Status: DC

## 2023-01-31 MED ORDER — BREZTRI AEROSPHERE 160-9-4.8 MCG/ACT IN AERO: 2.0000 | INHALATION_SPRAY | Freq: Two times a day (BID) | RESPIRATORY_TRACT | 0 refills | Status: DC

## 2023-01-31 NOTE — Progress Notes (Signed)
01/31/23- 52 yoF never smoker for acute visit Saw Dr Debbie Shaw 6/25 with moderate persistent asthma with cough, allergic rhinitis. Given Meds:  Advair 115-21 MDI 2 p bid , Dymista, albuterol hfa Pt called 9/8 message- using inhaler but cough persists and feeli to be worse upright and leaning forward. Some postnasal drip, no sinus pain. No reflux. Using otc cough syrups, cough drops. Can't tell that Advair helps. Inhaler may have helped some dyspnea at one point, but dyspnea not appng pressure on chest. Concerned possible infection, but only scant clear mucus, no fever or adenopathy. Throat and upper right anterior chest wall sore from repeated coughing. Pressure sensation right upper chest last 2 days. Coughing x 2 years. Somewhat worse recently. Better lying down and tendsarent problem now. Getting home tested for environmental problems. Some travel this summer, without change noted.  Lab-Resp Allergy Profile 11/16/22- elevated for dust mites, cedar pollen, not for cat/dog, ragweed, grasses.  CXR 06/12/18 IMPRESSION: No acute abnormality. Stable mild chronic bronchitic changes.    Prior to Admission medications   Medication Sig Start Date End Date Taking? Authorizing Provider  ADVAIR HFA 115-21 MCG/ACT inhaler Inhale 2 puffs into the lungs 2 (two) times daily. 12/01/22   Charlott Holler, MD  albuterol (VENTOLIN HFA) 108 (90 Base) MCG/ACT inhaler Inhale into the lungs. 06/07/22   [provider]  Ascorbic Acid (VITAMIN C PO) Take by mouth.    [provider]  Azelastine-Fluticasone 137-50 MCG/ACT SUSP Place 1 spray into the nose daily. 11/16/22   Charlott Holler, MD  B Complex Vitamins (VITAMIN B COMPLEX) CAPS as directed Orally Once a day    [provider]  busPIRone (BUSPAR) 10 MG tablet TAKE 1 TABLET (10 MG TOTAL) BY MOUTH 3 (THREE) TIMES DAILY. AS NEEDED FOR ANXIETY 01/09/19   [name], NP  Calcium-Magnesium-Vitamin D (CALCIUM MAGNESIUM PO) Take by mouth daily.     [provider]  estradiol (CLIMARA - DOSED IN MG/24 HR) 0.025 mg/24hr patch 0.025 mg once a week. 07/24/22   [provider]  fluticasone-salmeterol (ADVAIR HFA) 130-86 MCG/ACT inhaler INHALE 2 PUFFS INTO THE LUNGS TWICE A DAY 12/01/22   Charlott Holler, MD  ibuprofen (ADVIL,MOTRIN) 200 MG tablet Take 200 mg by mouth as needed.    [provider]  levocetirizine (XYZAL) 5 MG tablet Take 1 tablet (5 mg total) by mouth every evening. Patient not taking: Reported on 11/16/2022 12/22/17   [name], MD  medroxyPROGESTERone (PROVERA) 10 MG tablet 1 tablet with food Orally Once a day for 30 day(s) 10/23/21   [provider]  Omega-3 Fatty Acids (FISH OIL) 300 MG CAPS 1 capsule Orally when remebers    [provider]  polyethylene glycol powder (GLYCOLAX/MIRALAX) powder Take 17 g by mouth 2 (two) times daily as needed. 03/07/18   McVey, Madelaine Bhat, PA-C  Probiotic Product (PROBIOTIC DAILY PO) Take by mouth.    [provider]  valACYclovir (VALTREX) 1000 MG tablet Take 2 tablets (2,000 mg total) by mouth 2 (two) times daily. For 1 day 04/01/20   [name], NP  Vitamin E 180 MG (400 UNIT) CAPS 1 capsule Orally Once a day    [provider]  Zinc 50 MG TABS 1 tablet Orally Once a day    [provider]   Past Medical History:  Diagnosis Date   Abnormal Pap smear of cervix    Since 2005 with biopsy under colposcopy   Allergy  Anxiety    Asthma    as child   Depression    GERD (gastroesophageal reflux disease)    Panic attacks    Pure hypercholesterolemia 02/27/2018   Past Surgical History:  Procedure Laterality Date   CHOLECYSTECTOMY  age 86   crying, upset when waking up from anesthesia   FRACTURE SURGERY     KNEE ARTHROSCOPY     bilateral, multiple times   KNEE ARTHROSCOPY W/ ACL RECONSTRUCTION Left    in later 1990s   TONSILLECTOMY     ROS-see HPI   Negative unless "+" Constitutional:    weight loss,  night sweats, fevers, chills, fatigue, lassitude. HEENT:    headaches, difficulty swallowing, tooth/dental problems, sore throat,       sneezing, itching, ear ache, nasal congestion, post nasal drip, snoring CV:    chest pain, orthopnea, PND, swelling in lower extremities, anasarca,                                  dizziness, palpitations Resp:   shortness of breath with exertion or at rest.                productive cough,   non-productive cough, coughing up of blood.              change in color of mucus.  wheezing.   Skin:    rash or lesions. GI:  No-   heartburn, indigestion, abdominal pain, nausea, vomiting, diarrhea,                 change in bowel habits, loss of appetite GU: dysuria, change in color of urine, no urgency or frequency.   flank pain. MS:   joint pain, stiffness, decreased range of motion, back pain. Neuro-     nothing unusual Psych:  change in mood or affect.  depression or anxiety.   memory loss.  OBJ- Physical Exam General- Alert, Oriented, Affect-appropriate, Distress- none acute Skin- rash-none, lesions- none, excoriation- none Lymphadenopathy- none Head- atraumatic            Eyes- Gross vision intact, PERRLA, conjunctivae and secretions clear            Ears- Hearing, canals-normal            Nose- Clear, no-Septal dev, mucus, polyps, erosion, perforation             Throat- Mallampati II , mucosa clear , drainage- none, tonsils- atrophic Neck- flexible , trachea midline, no stridor , thyroid nl, carotid no bruit Chest - symmetrical excursion , unlabored           Heart/CV- RRR , no murmur , no gallop  , no rub, nl s1 s2                           - JVD- none , edema- none, stasis changes- none, varices- none           Lung- clear to P&A, wheeze- none, cough +x1, dry , dullness-none, rub- none           Chest wall-  Abd-  Br/ Gen/ Rectal- Not done, not indicated Extrem- cyanosis- none, clubbing, none, atrophy- none, strength- nl Neuro- grossly intact to  observation

## 2023-01-31 NOTE — Patient Instructions (Addendum)
Order- CXR   dx chronic cough  Order- sample x 2 Spiriva 1.25    inhale 2 puffs daily. Try this for 2 weeks  Order- sample x 2 Breztri   inhale 2 puffs then rinse mouth, twice daily  These are to try instead of your Advair. You can come back to that if it worked better. If one of these others seems better, let us know  Script sent for benzonatate perles    1 every 8 hours if needed for cough  You may ALSO use otc cough syrups like Delsym or Robitussin DM.  Consider trying Sugar-Free- Archie Balboa Rancher hard candies ([employer], [employer]) - good for soothing irritated throats.  You can follow-up with Dr Celine Mans

## 2023-01-31 NOTE — Assessment & Plan Note (Signed)
Not clear that this is asthma vs upper airway cough Plan- Throat soothing measures, CXR, Compare and contrast inhaler samples to isolate effective ingredient if there are any- Spiriva vs Breztri. Consider methacholine, gabapentin

## 2023-01-31 NOTE — Assessment & Plan Note (Addendum)
Consider formal allergy consult/ skin testing

## 2023-02-02 ENCOUNTER — Ambulatory Visit: Payer: No Typology Code available for payment source | Admitting: Internal Medicine

## 2023-02-03 ENCOUNTER — Encounter: Payer: Self-pay | Admitting: Internal Medicine

## 2023-02-07 NOTE — Telephone Encounter (Signed)
Try otc Pepcid or omeprazole twice daily- before breakfast and before supper- for 1 month. That will help with this assessment.

## 2023-04-07 ENCOUNTER — Ambulatory Visit: Payer: No Typology Code available for payment source | Admitting: Internal Medicine

## 2023-04-25 ENCOUNTER — Encounter: Payer: Self-pay | Admitting: Gastroenterology

## 2023-05-03 ENCOUNTER — Ambulatory Visit: Payer: 59 | Admitting: Allergy & Immunology

## 2023-05-03 ENCOUNTER — Encounter: Payer: Self-pay | Admitting: Allergy & Immunology

## 2023-05-03 VITALS — BP 108/60 | HR 63 | Temp 97.3°F | Resp 16 | Ht 67.72 in | Wt 184.0 lb

## 2023-05-03 DIAGNOSIS — K219 Gastro-esophageal reflux disease without esophagitis: Secondary | ICD-10-CM

## 2023-05-03 DIAGNOSIS — J310 Chronic rhinitis: Secondary | ICD-10-CM

## 2023-05-03 DIAGNOSIS — R053 Chronic cough: Secondary | ICD-10-CM

## 2023-05-03 MED ORDER — FAMOTIDINE 20 MG PO TABS: 20.0000 mg | ORAL_TABLET | Freq: Two times a day (BID) | ORAL | 3 refills | Status: DC

## 2023-05-03 NOTE — Patient Instructions (Addendum)
1. Chronic rhinitis - Because of insurance stipulations, we cannot do skin testing on the same day as your first visit. - We are all working to fight this, but for now we need to do two separate visits.  - We will know more after we do testing at the next visit.  - The skin testing visit can be squeezed in at your convenience.  - Then we can make a more full plan to address all of your symptoms. - Be sure to stop your antihistamines for 3 days before this appointment.   2. Chronic cough - I would just hold off on the asthma medications. - There is clearly some component of asthma since you do report improvement with the albuterol a few times per year.  - But clearly the daily medications would have helped if they were going to do so.   3. Gastroesophageal reflux disease - Continue with Pepcid 20mg  twice daily.   4. Return in about 1 week (around 05/10/2023) for SKIN TESTING (1-55) . You can have the follow up appointment with Dr. Dellis Anes or a Nurse Practicioner (our Nurse Practitioners are excellent and always have Physician oversight!).    Please inform us of any Emergency Department visits, hospitalizations, or changes in symptoms. Call us before going to the ED for breathing or allergy symptoms since we might be able to fit you in for a sick visit. Feel free to contact us anytime with any questions, problems, or concerns.  It was a pleasure to meet you today!  Websites that have reliable patient information: 1. American Academy of Asthma, Allergy, and Immunology: www.aaaai.org 2. Food Allergy Research and Education (FARE): foodallergy.org 3. Mothers of Asthmatics: http://www.asthmacommunitynetwork.org 4. American College of Allergy, Asthma, and Immunology: www.acaai.org      "Like" [place] on Facebook and Instagram for our latest updates!      A healthy democracy works best when [employer] participate! Make sure you are registered to vote! If you have moved or changed any of your  contact information, you will need to get this updated before voting! Scan the QR codes below to learn more!        Food Intolerance Versus Food Allergy  Food IntoleranceSome of the symptoms of food intolerance and food allergy are similar, but the differences between the two are very important. Eating a food you are intolerant to can leave you feeling miserable. However, if you have a true food allergy, your body's reaction to this food could be life-threatening.  Digestive system versus immune system  A food intolerance response takes place in the digestive system. It occurs when you are unable to properly breakdown the food. This could be due to enzyme deficiencies, sensitivity to food additives or reactions to naturally occurring chemicals in foods. Often, people can eat small amounts of the food without causing problems.  A food allergic reaction involves the immune system. Your immune system controls how your body defends itself. For instance, if you have an allergy to cow's milk, your immune system identifies cow's milk as an invader or allergen. Your immune system overreacts by producing antibodies called Immunoglobulin E (IgE). These antibodies travel to cells that release chemicals, causing an allergic reaction. Each type of IgE has a specific "radar" for each type of allergen.  Unlike an intolerance to food, a food allergy can cause a serious or even life-threatening reaction by eating a microscopic amount, touching or inhaling the food.  Symptoms of allergic reactions to foods are generally seen  on the skin (hives, itchiness, swelling of the skin). Gastrointestinal symptoms may include vomiting and diarrhea. Respiratory symptoms may accompany skin and gastrointestinal symptoms, but don't usually occur alone.  Anaphylaxis (pronounced an-a-fi-LAK-sis) is a serious allergic reaction that happens very quickly. Symptoms of anaphylaxis may include difficulty breathing, dizziness or loss of  consciousness. Without immediate treatment--an injection of epinephrine (adrenalin) and expert care--anaphylaxis can be fatal.  To the Point: there is a very serious difference between being intolerant to a food and having a food allergy.     Regarding Food IgG Testing: In IgG testing, the blood is tested for IgG antibodies instead of being tested for IgE antibodies (i.e., the antibodies typically associated with food allergies). The existence of serum IgG antibodies towards particular foods is claimed by many practitioners as a tool to diagnose food allergy or intolerance. The problem with this is that IgG is a "memory antibody." IgG signifies exposure to a food, not allergy to a food. Since a normal immune system should make IgG antibodies to foreign proteins, a positive IgG test to a food is a sign of a normal immune system. In fact, a positive result can actually indicate tolerance for the food, not intolerance. There is no scientific evidence to support IgG testing for the diagnosis of food allergies.

## 2023-05-03 NOTE — Progress Notes (Unsigned)
NEW PATIENT  Date of Service/Encounter:  05/03/23  Consult requested by: [facility]   Assessment:   Chronic rhinitis  Chronic cough  Gastroesophageal reflux disease, unspecified whether esophagitis present  Previously around a lot of smoking when she was younger   Soon to open a gallery   [employer]   Plan/Recommendations:   Assessment and Plan    Chronic Cough Persistent cough affecting quality of life. Previous allergy testing positive for dust mites and cedar. Previous trials of inhalers and allergy medications have not been effective. Patient has a history of asthma. Currently on Prilosec for possible reflux, which may be contributing to the cough. -Schedule allergy skin testing to update previous results. -Consider referral to ENT if allergy treatment is not effective.  Gastroesophageal Reflux Disease (GERD) Patient on Prilosec OTC twice daily for reflux, which may be contributing to chronic cough. Concerns about long-term use of PPIs. -Switch to famotidine (Pepcid) for reflux treatment due to concerns about long-term PPI use. Send in a 90-day supply to [facility].  General Health Maintenance -Continue Zyrtec at night for allergy symptoms. -Continue use of rescue inhaler as needed.         Patient Instructions  1. Chronic rhinitis - Because of insurance stipulations, we cannot do skin testing on the same day as your first visit. - We are all working to fight this, but for now we need to do two separate visits.  - We will know more after we do testing at the next visit.  - The skin testing visit can be squeezed in at your convenience.  - Then we can make a more full plan to address all of your symptoms. - Be sure to stop your antihistamines for 3 days before this appointment.   2. Chronic cough - I would just hold off on the asthma medications. - There is clearly some component of asthma since you do report improvement with the  albuterol a few times per year.  - But clearly the daily medications would have helped if they were going to do so.   3. Gastroesophageal reflux disease - Continue with Pepcid 20mg  twice daily.   4. Return in about 1 week (around 05/10/2023) for SKIN TESTING (1-55) . You can have the follow up appointment with Dr. Dellis Anes or a Nurse Practicioner (our Nurse Practitioners are excellent and always have Physician oversight!).    Please inform us of any Emergency Department visits, hospitalizations, or changes in symptoms. Call us before going to the ED for breathing or allergy symptoms since we might be able to fit you in for a sick visit. Feel free to contact us anytime with any questions, problems, or concerns.  It was a pleasure to meet you today!  Websites that have reliable patient information: 1. American Academy of Asthma, Allergy, and Immunology: www.aaaai.org 2. Food Allergy Research and Education (FARE): foodallergy.org 3. Mothers of Asthmatics: http://www.asthmacommunitynetwork.org 4. American College of Allergy, Asthma, and Immunology: www.acaai.org      "Like" [place] on Facebook and Instagram for our latest updates!      A healthy democracy works best when [employer] participate! Make sure you are registered to vote! If you have moved or changed any of your contact information, you will need to get this updated before voting! Scan the QR codes below to learn more!        Food Intolerance Versus Food Allergy  Food IntoleranceSome of the symptoms of food intolerance and food allergy  are similar, but the differences between the two are very important. Eating a food you are intolerant to can leave you feeling miserable. However, if you have a true food allergy, your body's reaction to this food could be life-threatening.  Digestive system versus immune system  A food intolerance response takes place in the digestive system. It occurs when you are unable to properly  breakdown the food. This could be due to enzyme deficiencies, sensitivity to food additives or reactions to naturally occurring chemicals in foods. Often, people can eat small amounts of the food without causing problems.  A food allergic reaction involves the immune system. Your immune system controls how your body defends itself. For instance, if you have an allergy to cow's milk, your immune system identifies cow's milk as an invader or allergen. Your immune system overreacts by producing antibodies called Immunoglobulin E (IgE). These antibodies travel to cells that release chemicals, causing an allergic reaction. Each type of IgE has a specific "radar" for each type of allergen.  Unlike an intolerance to food, a food allergy can cause a serious or even life-threatening reaction by eating a microscopic amount, touching or inhaling the food.  Symptoms of allergic reactions to foods are generally seen on the skin (hives, itchiness, swelling of the skin). Gastrointestinal symptoms may include vomiting and diarrhea. Respiratory symptoms may accompany skin and gastrointestinal symptoms, but don't usually occur alone.  Anaphylaxis (pronounced an-a-fi-LAK-sis) is a serious allergic reaction that happens very quickly. Symptoms of anaphylaxis may include difficulty breathing, dizziness or loss of consciousness. Without immediate treatment--an injection of epinephrine (adrenalin) and expert care--anaphylaxis can be fatal.  To the Point: there is a very serious difference between being intolerant to a food and having a food allergy.     Regarding Food IgG Testing: In IgG testing, the blood is tested for IgG antibodies instead of being tested for IgE antibodies (i.e., the antibodies typically associated with food allergies). The existence of serum IgG antibodies towards particular foods is claimed by many practitioners as a tool to diagnose food allergy or intolerance. The problem with this is that IgG is a  "memory antibody." IgG signifies exposure to a food, not allergy to a food. Since a normal immune system should make IgG antibodies to foreign proteins, a positive IgG test to a food is a sign of a normal immune system. In fact, a positive result can actually indicate tolerance for the food, not intolerance. There is no scientific evidence to support IgG testing for the diagnosis of food allergies.      {Blank single:19197::"This note in its entirety was forwarded to the Provider who requested this consultation."}  Subjective:   Debbie Shaw is a 52 y.o. female presenting today for evaluation of  Chief Complaint  Patient presents with   Establish Care   Cough    Persistent. Got inhalers and acid reflux medicine but still having issues.      Debbie Shaw has a history of the following: Patient Active Problem List   Diagnosis Date Noted   Asthma, mild persistent    Costochondritis 05/20/2018   Environmental and seasonal allergies 02/27/2018   H/O cold sores 02/27/2018   Pure hypercholesterolemia 02/27/2018   New onset headache 02/27/2018   Oral herpes simplex infection 01/03/2015   Anxiety 01/30/2013   FIBROCYSTIC BREAST DISEASE 02/10/2009   FATIGUE 02/10/2009    History obtained from: chart review and {Persons; PED relatives w/patient:19415::"patient"}.  Discussed the use of AI scribe software for  clinical note transcription with the patient and/or guardian, who gave verbal consent to proceed.  Debbie Shaw was referred by [facility].     Debbie Shaw is a 52 y.o. female presenting for {Blank single:19197::"a food challenge","a drug challenge","skin testing","a sick visit","an evaluation of ***","a follow up visit"}.  Discussed the use of AI scribe software for clinical note transcription with the patient, who gave verbal consent to proceed.  History of Present Illness   The patient, a non-smoker with a history of asthma in  childhood, presents with a chronic cough that has worsened over the past year. The cough is described as dry and persistent, severe enough to interfere with daily activities such as walking the dog. The patient also reports hoarseness and a sensation of something in the throat. Despite trials of various inhalers, allergy medications, and reflux medications, including Prilosec and Pepcid, the symptoms have not improved significantly.  The patient has a history of allergies, including dust mites and cedar, confirmed by previous testing. However, the patient has not found relief with allergy medications or nasal sprays. The patient also has a history of reflux, which was identified after a sudden increase in dental cavities. The patient has been taking Prilosec for reflux, which was thought to have provided some improvement initially.  The patient has been seen by pulmonologists for the cough and has tried multiple inhalers without significant relief. The patient also has a rescue inhaler, which is used infrequently, primarily during periods of high humidity.  The patient has no known food allergies and denies any history of smoking. 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 The patient also reports going through menopause, which was suggested as a possible trigger for the return of asthma symptoms.  The patient has been proactive in managing her environment for allergies, including using dust mite-proof bedding and avoiding clutter in the bedroom. The patient also reports having her house tested for mold, although no significant findings were reported.  The patient's cough and throat discomfort have become a significant concern, affecting her quality of life and prompting the desire for further investigation and treatment.         Asthma/Respiratory Symptom History: She dd not think   She has seen multiple  pulmonologist including Dr. Maple Hudson and Dr. Celine Mans.  She has tried Advair 115 mcg 2 puffs twice daily without much improvement.  She has been on [employer] and Spiriva as well. She did not think that they made much of a difference. She reports that this is a dry cough, almost as if there is something her throat that she needs to expel.    Allergic Rhinitis Symptom History: She has seen [facility] in the past. This was done around two years ago and then she was tested when she was much younger. She did not get along with the practice, so she is is seeking additional help. She was having a seasonal cough, but it has gotten worse. She reports some hoarseness. She has tried a multitude of allergy medications without much relief.   {Blank single:19197::"Food Allergy Symptom History: ***"," "}  {Blank single:19197::"Skin Symptom History: ***"," "}  GERD Symptom History: She has been on Prilosec in the past. She is on this every day. She continues to feel that the cough is related to allergies rather than GERD or asthma.   ***Otherwise, there is no history of other atopic diseases, including {Blank  multiple:19196:o:"asthma","food allergies","drug allergies","environmental allergies","stinging insect allergies","eczema","urticaria","contact dermatitis"}. There is no significant infectious history. ***Vaccinations are up to date.    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 If you have any questions, ask your nurse or doctor.          STOP taking these medications    Advair HFA 115-21 MCG/ACT inhaler Generic drug: fluticasone-salmeterol Stopped by: [facility] [place] [employer] 160-9-4.8 MCG/ACT Aero Generic drug: Budeson-Glycopyrrol-Formoterol Stopped by: Alfonse Spruce   Fish Oil 300 MG Caps Stopped by: Alfonse Spruce   levocetirizine 5 MG tablet Commonly known as: Xyzal Stopped by: Alfonse Spruce   Provera 10 MG tablet Generic drug: medroxyPROGESTERone Stopped by: Alfonse Spruce   Spiriva Respimat 1.25 MCG/ACT Aers Generic drug: Tiotropium Bromide Monohydrate Stopped by: Alfonse Spruce       TAKE these medications    albuterol 108 (90 Base) MCG/ACT inhaler Commonly known as: VENTOLIN HFA Inhale into the lungs.   Azelastine-Fluticasone 137-50 MCG/ACT Susp Place 1 spray into the nose daily.   benzonatate 200 MG capsule Commonly known as: TESSALON Take 1 capsule (200 mg total) by mouth 3 (three) times daily as needed for cough.   busPIRone 10 MG tablet Commonly known as: BUSPAR TAKE 1 TABLET (10 MG TOTAL) BY MOUTH 3 (THREE) TIMES DAILY. AS NEEDED FOR ANXIETY   CALCIUM MAGNESIUM PO Take by mouth daily.   estradiol 0.025 mg/24hr patch Commonly known as: CLIMARA - Dosed in mg/24 hr 0.025 mg once a week.   famotidine 20 MG tablet Commonly known as: PEPCID Take 1 tablet (20 mg total) by mouth 2 (two) times daily. Started by: Alfonse Spruce   ibuprofen 200 MG tablet Commonly known as: ADVIL Take 200 mg by mouth as needed.   polyethylene glycol powder 17  GM/SCOOP powder Commonly known as: GLYCOLAX/MIRALAX Take 17 g by mouth 2 (two) times daily as needed.   PROBIOTIC DAILY PO Take by mouth.   valACYclovir 1000 MG tablet Commonly known as: VALTREX Take 2 tablets (2,000 mg total) by mouth 2 (two) times daily. For 1 day   Vitamin B Complex Caps as directed Orally Once a day   VITAMIN C PO Take by mouth.   Vitamin E 180 MG (400 UNIT) Caps 1 capsule Orally Once a day   Zinc 50 MG Tabs 1 tablet Orally Once a day        Birth History: {Blank single:19197::"non-contributory","born premature and spent time in the NICU","born at term without complications"}  Developmental History: Debbie Shaw  has met all milestones on time. She has required no {Blank multiple:19196:a:"speech therapy","occupational therapy","physical therapy"}. ***non-contributory  Past Surgical History: Past Surgical History:  Procedure Laterality Date   CHOLECYSTECTOMY  age 7   crying, upset when waking up from anesthesia   FRACTURE SURGERY     KNEE ARTHROSCOPY     bilateral, multiple times   KNEE ARTHROSCOPY W/ ACL RECONSTRUCTION Left    in later 1990s   TONSILLECTOMY       Family History: Family History  Problem Relation Age of Onset   Colon cancer Maternal Grandfather        unknown age   Cancer Maternal Grandfather        colon   Heart disease Maternal Grandmother    Cancer Maternal Grandmother    Thyroid disease Mother    Hyperlipidemia Mother    Irritable bowel syndrome Maternal Uncle        x 3   Irritable bowel syndrome Cousin    Esophageal cancer Neg Hx    Rectal cancer Neg Hx    Stomach cancer Neg Hx      Social History: Debbie Shaw lives at home with her family.  They live in a house that is 61+ years old.  There is carpeting and LVP in the main living areas.  They have gas and electric heating with central cooling.  There is a dog inside of the home.  There are dust mite covers on the bed as well as the pillow.  There is no tobacco exposure.   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  [place]  She does use a HEPA filter in her home.   Review of systems otherwise negative other than that mentioned in the HPI.    Objective:   Blood pressure 108/60, pulse 63, temperature (!) 97.3 F (36.3 C), temperature source Temporal, resp. rate 16, height 5' 7.72" (1.72 m), weight 184 lb (83.5 kg), SpO2 96%. Body mass index is 28.21 kg/m.     Physical Exam   Diagnostic studies: {Blank single:19197::"none","deferred due to recent antihistamine use","deferred due to insurance stipulations that require a separate visit for testing","labs sent instead"," "}  Spirometry: {Blank single:19197::"results normal (FEV1: ***%, FVC: ***%, FEV1/FVC: ***%)","results abnormal (FEV1: ***%, FVC: ***%, FEV1/FVC: ***%)"}.    {Blank single:19197::"Spirometry consistent with mild obstructive disease","Spirometry consistent with moderate obstructive disease","Spirometry consistent with severe obstructive disease","Spirometry consistent with possible restrictive disease","Spirometry consistent with mixed obstructive and restrictive disease","Spirometry uninterpretable due to technique","Spirometry consistent with normal pattern"}. {Blank single:19197::"Albuterol/Atrovent nebulizer","Xopenex/Atrovent nebulizer","Albuterol nebulizer","Albuterol four puffs via MDI","Xopenex four puffs via MDI"} treatment given in clinic with {Blank single:19197::"significant improvement in FEV1 per ATS criteria","significant improvement in FVC per ATS criteria","significant improvement in FEV1 and FVC per ATS criteria","improvement in FEV1, but not significant per ATS criteria","improvement in FVC, but not significant per ATS criteria","improvement in FEV1 and FVC, but not significant per ATS criteria","no improvement"}.  Allergy Studies: {Blank  single:19197::"none","deferred due to recent antihistamine use","deferred due to insurance stipulations that require a separate visit for testing","labs sent instead"," "}    {Blank single:19197::"Allergy testing results were read and interpreted by myself, documented by clinical staff."," "}         Malachi Bonds, MD [facility]

## 2023-05-04 ENCOUNTER — Encounter: Payer: Self-pay | Admitting: Allergy & Immunology

## 2023-05-12 ENCOUNTER — Encounter: Payer: Self-pay | Admitting: Allergy & Immunology

## 2023-05-12 ENCOUNTER — Ambulatory Visit (INDEPENDENT_AMBULATORY_CARE_PROVIDER_SITE_OTHER): Payer: 59 | Admitting: Allergy & Immunology

## 2023-05-12 DIAGNOSIS — R053 Chronic cough: Secondary | ICD-10-CM

## 2023-05-12 DIAGNOSIS — J3089 Other allergic rhinitis: Secondary | ICD-10-CM

## 2023-05-12 DIAGNOSIS — J302 Other seasonal allergic rhinitis: Secondary | ICD-10-CM

## 2023-05-12 DIAGNOSIS — K219 Gastro-esophageal reflux disease without esophagitis: Secondary | ICD-10-CM

## 2023-05-12 MED ORDER — RYALTRIS 665-25 MCG/ACT NA SUSP: 2.0000 | Freq: Two times a day (BID) | NASAL | 5 refills | Status: DC | PRN

## 2023-05-12 MED ORDER — LEVOCETIRIZINE DIHYDROCHLORIDE 5 MG PO TABS: 5.0000 mg | ORAL_TABLET | Freq: Every evening | ORAL | 1 refills | Status: DC

## 2023-05-12 NOTE — Patient Instructions (Addendum)
1. Chronic rhinitis - Testing today showed: trees, indoor molds, outdoor molds, and dust mites - Copy of test results provided.  - Avoidance measures provided. - Stop taking: Zyrtec - Continue with:  - Start taking: Xyzal (levocetirizine) 5mg  tablet once daily and Ryaltris (olopatadine/mometasone) two sprays per nostril 1-2 times daily as needed\ - You can use an extra dose of the antihistamine, if needed, for breakthrough symptoms.  - Consider nasal saline rinses 1-2 times daily to remove allergens from the nasal cavities as well as help with mucous clearance (this is especially helpful to do before the nasal sprays are given) - Consider allergy shots as a means of long-term control. - Allergy shots "re-train" and "reset" the immune system to ignore environmental allergens and decrease the resulting immune response to those allergens (sneezing, itchy watery eyes, runny nose, nasal congestion, etc).    - Allergy shots improve symptoms in 75-85% of patients.  - We can discuss more at the next appointment if the medications are not working for you. - CPT codes provided.   2. Chronic cough - Continue with albuterol as needed.   3. Gastroesophageal reflux disease - Continue with Pepcid 20mg  twice daily.   4. Return in about 6 weeks (around 06/23/2023).    Please inform us of any Emergency Department visits, hospitalizations, or changes in symptoms. Call us before going to the ED for breathing or allergy symptoms since we might be able to fit you in for a sick visit. Feel free to contact us anytime with any questions, problems, or concerns.  It was a pleasure to see you today!  Websites that have reliable patient information: 1. American Academy of Asthma, Allergy, and Immunology: www.aaaai.org 2. Food Allergy Research and Education (FARE): foodallergy.org 3. Mothers of Asthmatics: http://www.asthmacommunitynetwork.org 4. American College of Allergy, Asthma, and Immunology:  www.acaai.org      "Like" [place] on Facebook and Instagram for our latest updates!      A healthy democracy works best when [employer] participate! Make sure you are registered to vote! If you have moved or changed any of your contact information, you will need to get this updated before voting! Scan the QR codes below to learn more!       Airborne Adult Perc - 05/12/23 0900     Time Antigen Placed 4098    Allergen Manufacturer Waynette Buttery    Location Back    Number of Test 55    Panel 1 Select    1. Control-Buffer 50% Glycerol Negative    2. Control-Histamine 2+    3. Bahia Negative    4. [place] Negative    5. Johnson Negative    6. Kentucky Blue Negative    7. Meadow Fescue Negative    8. Perennial Rye Negative    9. Timothy Negative    10. Ragweed Mix Negative    11. Cocklebur Negative    12. Plantain,  English Negative    13. Baccharis Negative    14. Dog Fennel Negative    15. Russian Thistle Negative    16. Lamb's Quarters Negative    17. Sheep Sorrell Negative    18. Rough Pigweed Negative    19. Marsh Elder, Rough Negative    20. Mugwort, Common Negative    21. Box, Elder Negative    22. Cedar, red Negative    23. Sweet Gum Negative    24. Pecan Pollen 2+    25. Pine Mix Negative    26. [place], [place]  Pollen Negative    27. Red Mulberry Negative    28. Ash Mix Negative    29. Birch Mix Negative    30. Beech American Negative    31. Cottonwood, [place] Negative    32. Hickory, White Negative    33. Maple Mix Negative    34. Oak, [place] Mix Negative    35. Sycamore Eastern Negative    36. Alternaria Alternata Negative    37. Cladosporium Herbarum Negative    38. Aspergillus Mix Negative    39. Penicillium Mix Negative    40. Bipolaris Sorokiniana (Helminthosporium) Negative    41. Drechslera Spicifera (Curvularia) Negative    42. Mucor Plumbeus Negative    43. Fusarium Moniliforme Negative    44. Aureobasidium Pullulans (pullulara) Negative    45.  Rhizopus Oryzae Negative    46. Botrytis Cinera Negative    47. Epicoccum Nigrum Negative    48. Phoma Betae Negative    49. Dust Mite Mix 4+    50. Cat Hair 10,000 BAU/ml Negative    51.  Dog Epithelia Negative    52. Mixed Feathers Negative    53. Horse Epithelia Negative    54. Cockroach, German Negative    55. Tobacco Leaf Negative             Intradermal - 05/12/23 0954     Time Antigen Placed 1000    Allergen Manufacturer Waynette Buttery    Location Arm    Number of Test 15    Control Negative    Bahia Negative    [place] Negative    Johnson Negative    7 Grass Negative    Ragweed Mix Negative    Weed Mix Negative    Tree Mix Negative    Mold 1 1+    Mold 2 1+    Mold 3 1+    Mold 4 1+    Cat Negative    Dog Negative    Cockroach Negative             Control of Mold Allergen   Mold and fungi can grow on a variety of surfaces provided certain temperature and moisture conditions exist.  Outdoor molds grow on plants, decaying vegetation and soil.  The major outdoor mold, Alternaria and Cladosporium, are found in very high numbers during hot and dry conditions.  Generally, a late Summer - Fall peak is seen for common outdoor fungal spores.  Rain will temporarily lower outdoor mold spore count, but counts rise rapidly when the rainy period ends.  The most important indoor molds are Aspergillus and Penicillium.  Dark, humid and poorly ventilated basements are ideal sites for mold growth.  The next most common sites of mold growth are the bathroom and the kitchen.  Outdoor (Seasonal) Mold Control  Positive outdoor molds via skin testing: Alternaria, Cladosporium, Bipolaris (Helminthsporium), Drechslera (Curvalaria), and Mucor  Use air conditioning and keep windows closed Avoid exposure to decaying vegetation. Avoid leaf raking. Avoid grain handling. Consider wearing a face mask if working in moldy areas.    Indoor (Perennial) Mold Control   Positive indoor molds via  skin testing: Aspergillus, Penicillium, Fusarium, Aureobasidium (Pullulara), and Rhizopus  Maintain humidity below 50%. Clean washable surfaces with 5% bleach solution. Remove sources e.g. contaminated carpets.    Control of Dust Mite Allergen    Dust mites play a major role in allergic asthma and rhinitis.  They occur in environments with high humidity wherever human skin is found.  Dust  mites absorb humidity from the atmosphere (ie, they do not drink) and feed on organic matter (including shed human and animal skin).  Dust mites are a microscopic type of insect that you cannot see with the naked eye.  High levels of dust mites have been detected from mattresses, pillows, carpets, upholstered furniture, bed covers, clothes, soft toys and any woven material.  The principal allergen of the dust mite is found in its feces.  A gram of dust may contain 1,000 mites and 250,000 fecal particles.  Mite antigen is easily measured in the air during house cleaning activities.  Dust mites do not bite and do not cause harm to humans, other than by triggering allergies/asthma.    Ways to decrease your exposure to dust mites in your home:  Encase mattresses, box springs and pillows with a mite-impermeable barrier or cover   Wash sheets, blankets and drapes weekly in hot water (130 F) with detergent and dry them in a dryer on the hot setting.  Have the room cleaned frequently with a vacuum cleaner and a damp dust-mop.  For carpeting or rugs, vacuuming with a vacuum cleaner equipped with a high-efficiency particulate air (HEPA) filter.  The dust mite allergic individual should not be in a room which is being cleaned and should wait 1 hour after cleaning before going into the room. Do not sleep on upholstered furniture (eg, couches).   If possible removing carpeting, upholstered furniture and drapery from the home is ideal.  Horizontal blinds should be eliminated in the rooms where the person spends the most time  (bedroom, study, television room).  Washable vinyl, roller-type shades are optimal. Remove all non-washable stuffed toys from the bedroom.  Wash stuffed toys weekly like sheets and blankets above.   Reduce indoor humidity to less than 50%.  Inexpensive humidity monitors can be purchased at most hardware stores.  Do not use a humidifier as can make the problem worse and are not recommended.  Reducing Pollen Exposure  The American Academy of Allergy, Asthma and Immunology suggests the following steps to reduce your exposure to pollen during allergy seasons.    Do not hang sheets or clothing out to dry; pollen may collect on these items. Do not mow lawns or spend time around freshly cut grass; mowing stirs up pollen. Keep windows closed at night.  Keep car windows closed while driving. Minimize morning activities outdoors, a time when pollen counts are usually at their highest. Stay indoors as much as possible when pollen counts or humidity is high and on windy days when pollen tends to remain in the air longer. Use air conditioning when possible.  Many air conditioners have filters that trap the pollen spores. Use a HEPA room air filter to remove pollen form the indoor air you breathe.  Allergy Shots  Allergies are the result of a chain reaction that starts in the immune system. Your immune system controls how your body defends itself. For instance, if you have an allergy to pollen, your immune system identifies pollen as an invader or allergen. Your immune system overreacts by producing antibodies called Immunoglobulin E (IgE). These antibodies travel to cells that release chemicals, causing an allergic reaction.  The concept behind allergy immunotherapy, whether it is received in the form of shots or tablets, is that the immune system can be desensitized to specific allergens that trigger allergy symptoms. Although it requires time and patience, the payback can be long-term relief. Allergy  injections contain a dilute solution of  those substances that you are allergic to based upon your skin testing and allergy history.   How Do Allergy Shots Work?  Allergy shots work much like a vaccine. Your body responds to injected amounts of a particular allergen given in increasing doses, eventually developing a resistance and tolerance to it. Allergy shots can lead to decreased, minimal or no allergy symptoms.  There generally are two phases: build-up and maintenance. Build-up often ranges from three to six months and involves receiving injections with increasing amounts of the allergens. The shots are typically given once or twice a week, though more rapid build-up schedules are sometimes used.  The maintenance phase begins when the most effective dose is reached. This dose is different for each person, depending on how allergic you are and your response to the build-up injections. Once the maintenance dose is reached, there are longer periods between injections, typically two to four weeks.  Occasionally doctors give cortisone-type shots that can temporarily reduce allergy symptoms. These types of shots are different and should not be confused with allergy immunotherapy shots.  Who Can Be Treated with Allergy Shots?  Allergy shots may be a good treatment approach for people with allergic rhinitis (hay fever), allergic asthma, conjunctivitis (eye allergy) or stinging insect allergy.   Before deciding to begin allergy shots, you should consider:   The length of allergy season and the severity of your symptoms  Whether medications and/or changes to your environment can control your symptoms  Your desire to avoid long-term medication use  Time: allergy immunotherapy requires a major time commitment  Cost: may vary depending on your insurance coverage  Allergy shots for children age 6 and older are effective and often well tolerated. They might prevent the onset of new allergen  sensitivities or the progression to asthma.  Allergy shots are not started on patients who are pregnant but can be continued on patients who become pregnant while receiving them. In some patients with other medical conditions or who take certain common medications, allergy shots may be of risk. It is important to mention other medications you talk to your allergist.   What are the two types of build-ups offered:   RUSH or Rapid Desensitization -- one day of injections lasting from 8:30-4:30pm, injections every 1 hour.  Approximately half of the build-up process is completed in that one day.  The following week, normal build-up is resumed, and this entails ~16 visits either weekly or twice weekly, until reaching your "maintenance dose" which is continued weekly until eventually getting spaced out to every month for a duration of 3 to 5 years. The regular build-up appointments are nurse visits where the injections are administered, followed by required monitoring for 30 minutes.    Traditional build-up -- weekly visits for 6 -12 months until reaching "maintenance dose", then continue weekly until eventually spacing out to every 4 weeks as above. At these appointments, the injections are administered, followed by required monitoring for 30 minutes.     Either way is acceptable, and both are equally effective. With the rush protocol, the advantage is that less time is spent here for injections overall AND you would also reach maintenance dosing faster (which is when the clinical benefit starts to become more apparent). Not everyone is a candidate for rapid desensitization.   IF we proceed with the RUSH protocol, there are premedications which must be taken the day before and the day after the rush only (this includes antihistamines, steroids, and Singulair).  After the  rush day, no prednisone or Singulair is required, and we just recommend antihistamines taken on your injection day.  What Is An Estimate  of the Costs?  If you are interested in starting allergy injections, please check with your insurance company about your coverage for both allergy vial sets and allergy injections.  Please do so prior to making the appointment to start injections.  The following are CPT codes to give to your insurance company. These are the amounts we BILL to the insurance company, but the amount YOU WILL PAY and WE RECEIVE IS SUBSTANTIALLY LESS and depends on the contracts we have with different insurance companies.   Amount Billed to Insurance One allergy vial set  CPT 95165   $ 1200     Two allergy vial set  CPT 95165   $ 2400     Three allergy vial set  CPT 95165   $ 3600     One injection   CPT 95115   $ 35  Two injections   CPT 95117   $ 40 RUSH (Rapid Desensitization) CPT 95180 x 8 hours $500/hour  Regarding the allergy injections, your co-pay may or may not apply with each injection, so please confirm this with your insurance company. When you start allergy injections, 1 or 2 sets of vials are made based on your allergies.  Not all patients can be on one set of vials. A set of vials lasts 6 months to a year depending on how quickly you can proceed with your build-up of your allergy injections. Vials are personalized for each patient depending on their specific allergens.  How often are allergy injection given during the build-up period?   Injections are given at least weekly during the build-up period until your maintenance dose is achieved. Per the doctor's discretion, you may have the option of getting allergy injections two times per week during the build-up period. However, there must be at least 48 hours between injections. The build-up period is usually completed within 6-12 months depending on your ability to schedule injections and for adjustments for reactions. When maintenance dose is reached, your injection schedule is gradually changed to every two weeks and later to every three weeks. Injections  will then continue every 4 weeks. Usually, injections are continued for a total of 3-5 years.   When Will I Feel Better?  Some may experience decreased allergy symptoms during the build-up phase. For others, it may take as long as 12 months on the maintenance dose. If there is no improvement after a year of maintenance, your allergist will discuss other treatment options with you.  If you aren't responding to allergy shots, it may be because there is not enough dose of the allergen in your vaccine or there are missing allergens that were not identified during your allergy testing. Other reasons could be that there are high levels of the allergen in your environment or major exposure to non-allergic triggers like tobacco smoke.  What Is the Length of Treatment?  Once the maintenance dose is reached, allergy shots are generally continued for three to five years. The decision to stop should be discussed with your allergist at that time. Some people may experience a permanent reduction of allergy symptoms. Others may relapse and a longer course of allergy shots can be considered.  What Are the Possible Reactions?  The two types of adverse reactions that can occur with allergy shots are local and systemic. Common local reactions include very mild redness and swelling  at the injection site, which can happen immediately or several hours after. Report a delayed reaction from your last injection. These include arm swelling or runny nose, watery eyes or cough that occurs within 12-24 hours after injection. A systemic reaction, which is less common, affects the entire body or a particular body system. They are usually mild and typically respond quickly to medications. Signs include increased allergy symptoms such as sneezing, a stuffy nose or hives.   Rarely, a serious systemic reaction called anaphylaxis can develop. Symptoms include swelling in the throat, wheezing, a feeling of tightness in the chest, nausea  or dizziness. Most serious systemic reactions develop within 30 minutes of allergy shots. This is why it is strongly recommended you wait in your doctor's office for 30 minutes after your injections. Your allergist is trained to watch for reactions, and his or her staff is trained and equipped with the proper medications to identify and treat them.   Report to the nurse immediately if you experience any of the following symptoms: swelling, itching or redness of the skin, hives, watery eyes/nose, breathing difficulty, excessive sneezing, coughing, stomach pain, diarrhea, or light headedness. These symptoms may occur within 15-20 minutes after injection and may require medication.   Who Should Administer Allergy Shots?  The preferred location for receiving shots is your prescribing allergist's office. Injections can sometimes be given at another facility where the physician and staff are trained to recognize and treat reactions, and have received instructions by your prescribing allergist.  What if I am late for an injection?   Injection dose will be adjusted depending upon how many days or weeks you are late for your injection.   What if I am sick?   Please report any illness to the nurse before receiving injections. She may adjust your dose or postpone injections depending on your symptoms. If you have fever, flu, sinus infection or chest congestion it is best to postpone allergy injections until you are better. Never get an allergy injection if your asthma is causing you problems. If your symptoms persist, seek out medical care to get your health problem under control.  What If I am or Become Pregnant:  Women that become pregnant should schedule an appointment with [facility] before receiving any further allergy injections.

## 2023-05-12 NOTE — Progress Notes (Signed)
FOLLOW UP  Date of Service/Encounter:  05/12/23   Assessment:   Chronic cough  Seasonal and perennial allergic rhinitis (trees, indoor molds, outdoor molds, and dust mites) - might benefit from allergy shots (could ignore the tree pollen and mix the remaining allergens into one vial)  Gastroesophageal reflux disease  Plan/Recommendations:   1. Chronic rhinitis - Testing today showed: trees, indoor molds, outdoor molds, and dust mites - Copy of test results provided.  - Avoidance measures provided. - Stop taking: Zyrtec - Start taking: Xyzal (levocetirizine) 5mg  tablet once daily and Ryaltris (olopatadine/mometasone) two sprays per nostril 1-2 times daily as needed You can use an extra dose of the antihistamine, if needed, for breakthrough symptoms.  - Consider nasal saline rinses 1-2 times daily to remove allergens from the nasal cavities as well as help with mucous clearance (this is especially helpful to do before the nasal sprays are given) - Consider allergy shots as a means of long-term control. - Allergy shots "re-train" and "reset" the immune system to ignore environmental allergens and decrease the resulting immune response to those allergens (sneezing, itchy watery eyes, runny nose, nasal congestion, etc).    - Allergy shots improve symptoms in 75-85% of patients.  - We can discuss more at the next appointment if the medications are not working for you. - CPT codes provided.   2. Chronic cough - Continue with albuterol as needed.   3. Gastroesophageal reflux disease - Continue with Pepcid 20mg  twice daily.   4. Return in about 6 weeks (around 06/23/2023).   Subjective:   Debbie Shaw is a 52 y.o. female presenting today for follow up of No chief complaint on file.   Debbie Shaw has a history of the following: Patient Active Problem List   Diagnosis Date Noted   Asthma, mild persistent    Costochondritis 05/20/2018   Environmental and  seasonal allergies 02/27/2018   H/O cold sores 02/27/2018   Pure hypercholesterolemia 02/27/2018   New onset headache 02/27/2018   Oral herpes simplex infection 01/03/2015   Anxiety 01/30/2013   FIBROCYSTIC BREAST DISEASE 02/10/2009   FATIGUE 02/10/2009    History obtained from: chart review and patient.  Discussed the use of AI scribe software for clinical note transcription with the patient and/or guardian, who gave verbal consent to proceed.  Debbie Shaw is a 52 y.o. female presenting for skin testing. She was last seen on December 10th. We could not do testing because her insurance company does not cover testing on the same day as a New Patient visit. She has been off of all antihistamines 3 days in anticipation of the testing.   Otherwise, there have been no changes to her past medical history, surgical history, family history, or social history.    Review of systems otherwise negative other than that mentioned in the HPI.    Objective:   There were no vitals taken for this visit. There is no height or weight on file to calculate BMI.    Physical exam deferred since this was a skin testing appointment only.   Diagnostic studies:   Allergy Studies:     Airborne Adult Perc - 05/12/23 0900     Time Antigen Placed 1610    Allergen Manufacturer Waynette Buttery    Location Back    Number of Test 55    Panel 1 Select    1. Control-Buffer 50% Glycerol Negative    2. Control-Histamine 2+    3. Bahia Negative  4. French Southern Territories Negative    5. Johnson Negative    6. Kentucky Blue Negative    7. Meadow Fescue Negative    8. Perennial Rye Negative    9. Timothy Negative    10. Ragweed Mix Negative    11. Cocklebur Negative    12. Plantain,  English Negative    13. Baccharis Negative    14. Dog Fennel Negative    15. Russian Thistle Negative    16. Lamb's Quarters Negative    17. Sheep Sorrell Negative    18. Rough Pigweed Negative    19. Marsh Elder, Rough Negative    20. Mugwort,  Common Negative    21. Box, Elder Negative    22. Cedar, red Negative    23. Sweet Gum Negative    24. Pecan Pollen 2+    25. Pine Mix Negative    26. Walnut, Black Pollen Negative    27. Red Mulberry Negative    28. Ash Mix Negative    29. Birch Mix Negative    30. Beech American Negative    31. Cottonwood, Guinea-Bissau Negative    32. Hickory, White Negative    33. Maple Mix Negative    34. Oak, Guinea-Bissau Mix Negative    35. Sycamore Eastern Negative    36. Alternaria Alternata Negative    37. Cladosporium Herbarum Negative    38. Aspergillus Mix Negative    39. Penicillium Mix Negative    40. Bipolaris Sorokiniana (Helminthosporium) Negative    41. Drechslera Spicifera (Curvularia) Negative    42. Mucor Plumbeus Negative    43. Fusarium Moniliforme Negative    44. Aureobasidium Pullulans (pullulara) Negative    45. Rhizopus Oryzae Negative    46. Botrytis Cinera Negative    47. Epicoccum Nigrum Negative    48. Phoma Betae Negative    49. Dust Mite Mix 4+    50. Cat Hair 10,000 BAU/ml Negative    51.  Dog Epithelia Negative    52. Mixed Feathers Negative    53. Horse Epithelia Negative    54. Cockroach, German Negative    55. Tobacco Leaf Negative             Intradermal - 05/12/23 0954     Time Antigen Placed 1000    Allergen Manufacturer Waynette Buttery    Location Arm    Number of Test 15    Control Negative    Bahia Negative    French Southern Territories Negative    Johnson Negative    7 Grass Negative    Ragweed Mix Negative    Weed Mix Negative    Tree Mix Negative    Mold 1 1+    Mold 2 1+    Mold 3 1+    Mold 4 1+    Cat Negative    Dog Negative    Cockroach Negative             Allergy testing results were read and interpreted by myself, documented by clinical staff.      Debbie Bonds, MD  [facility]

## 2023-06-01 ENCOUNTER — Encounter (INDEPENDENT_AMBULATORY_CARE_PROVIDER_SITE_OTHER): Payer: Self-pay | Admitting: Otolaryngology

## 2023-06-15 LAB — HM MAMMOGRAPHY

## 2023-07-15 ENCOUNTER — Ambulatory Visit: Payer: No Typology Code available for payment source | Admitting: Gastroenterology

## 2023-08-15 ENCOUNTER — Institutional Professional Consult (permissible substitution) (INDEPENDENT_AMBULATORY_CARE_PROVIDER_SITE_OTHER): Payer: No Typology Code available for payment source | Admitting: Otolaryngology

## 2023-10-28 ENCOUNTER — Other Ambulatory Visit: Payer: Self-pay | Admitting: Allergy & Immunology

## 2023-12-06 ENCOUNTER — Ambulatory Visit: Payer: Self-pay | Admitting: Family Medicine

## 2024-01-10 ENCOUNTER — Encounter: Payer: Self-pay | Admitting: Family Medicine

## 2024-01-10 ENCOUNTER — Ambulatory Visit: Admitting: Family Medicine

## 2024-01-10 VITALS — BP 116/76 | HR 80 | Temp 98.1°F | Ht 67.5 in | Wt 190.0 lb

## 2024-01-10 DIAGNOSIS — E782 Mixed hyperlipidemia: Secondary | ICD-10-CM | POA: Diagnosis not present

## 2024-01-10 DIAGNOSIS — R053 Chronic cough: Secondary | ICD-10-CM | POA: Diagnosis not present

## 2024-01-10 DIAGNOSIS — Z7689 Persons encountering health services in other specified circumstances: Secondary | ICD-10-CM

## 2024-01-10 DIAGNOSIS — F419 Anxiety disorder, unspecified: Secondary | ICD-10-CM

## 2024-01-10 DIAGNOSIS — E663 Overweight: Secondary | ICD-10-CM | POA: Diagnosis not present

## 2024-01-10 MED ORDER — BENZONATATE 200 MG PO CAPS: 200.0000 mg | ORAL_CAPSULE | Freq: Three times a day (TID) | ORAL | 1 refills | Status: AC | PRN

## 2024-01-10 NOTE — Patient Instructions (Signed)
-  It was a pleasure to meet you and look forward to taking care of you.  -Refilled Benzonatate  for chronic cough.  -Continue all other medications.  -Ordered labs. Please make a lab appointment when able to fast. Office will call with lab results and will be available through MyChart.  -Follow up in 6 months for a physical.

## 2024-01-10 NOTE — Progress Notes (Signed)
 New Patient Office Visit  Subjective   Patient ID: Debbie Shaw, female    DOB: [birth year]  Age: 53 y.o. MRN: [id number]  CC:  Chief Complaint  Patient presents with   Establish Care    HPI Debbie Shaw presents to establish care with new provider.   Patients previous primary care provider: [facility] with Charlie [facility], NP. Last seen 2022.    Specialist: [facility] with Dr. Eleanor Jury  [facility] with Dr. Gwendlyn Buddy  [facility]  Previously seen pulmonary and cardiology, no longer under their care   Cough: Patient has tried Benzonatate  200mg  capsule TID PRN. She reports they do help with having a coughing episode.   Anxiety: Patient takes Buspirone  10mg  TID. But, reports she takes it once or twice a month at night to help her sleep.   All other medications prescribed by specialist.  Outpatient Encounter Medications as of 01/10/2024  Medication Sig   albuterol (VENTOLIN HFA) 108 (90 Base) MCG/ACT inhaler Inhale into the lungs.   Ascorbic Acid (VITAMIN C PO) Take by mouth.   B Complex Vitamins (VITAMIN B COMPLEX) CAPS as directed Orally Once a day   busPIRone  (BUSPAR ) 10 MG tablet TAKE 1 TABLET (10 MG TOTAL) BY MOUTH 3 (THREE) TIMES DAILY. AS NEEDED FOR ANXIETY   Calcium-Magnesium-Vitamin D  (CALCIUM MAGNESIUM PO) Take by mouth daily.   estradiol (CLIMARA - DOSED IN MG/24 HR) 0.025 mg/24hr patch 0.025 mg once a week.   famotidine  (PEPCID ) 20 MG tablet Take 1 tablet (20 mg total) by mouth 2 (two) times daily.   ibuprofen (ADVIL,MOTRIN) 200 MG tablet Take 200 mg by mouth as needed.   levocetirizine (XYZAL ) 5 MG tablet TAKE 1 TABLET BY MOUTH EVERY DAY IN THE EVENING   polyethylene glycol powder (GLYCOLAX /MIRALAX ) powder Take 17 g by mouth 2 (two) times daily as needed.   Probiotic Product (PROBIOTIC DAILY PO) Take by mouth.   Vitamin E 180 MG (400 UNIT) CAPS 1 capsule  Orally Once a day   Zinc 50 MG TABS 1 tablet Orally Once a day   [DISCONTINUED] benzonatate  (TESSALON ) 200 MG capsule Take 1 capsule (200 mg total) by mouth 3 (three) times daily as needed for cough.   [DISCONTINUED] Olopatadine-Mometasone (RYALTRIS ) 665-25 MCG/ACT SUSP Place 2 sprays into the nose 2 (two) times daily as needed.   benzonatate  (TESSALON ) 200 MG capsule Take 1 capsule (200 mg total) by mouth 3 (three) times daily as needed for cough.   [DISCONTINUED] valACYclovir  (VALTREX ) 1000 MG tablet Take 2 tablets (2,000 mg total) by mouth 2 (two) times daily. For 1 day (Patient not taking: Reported on 05/03/2023)   No facility-administered encounter medications on file as of 01/10/2024.    Past Medical History:  Diagnosis Date   Abnormal Pap smear of cervix    Since 2005 with biopsy under colposcopy   Allergy     Anxiety    Asthma    as child   Depression    GERD (gastroesophageal reflux disease)    Panic attacks    Pure hypercholesterolemia 02/27/2018   Urticaria     Past Surgical History:  Procedure Laterality Date   CHOLECYSTECTOMY  age 28   crying, upset when waking up from anesthesia   FRACTURE SURGERY     KNEE ARTHROSCOPY     bilateral, multiple times   KNEE ARTHROSCOPY W/ ACL RECONSTRUCTION Left    in later  1990s   TONSILLECTOMY      Family History  Problem Relation Age of Onset   Thyroid  disease Mother    Hyperlipidemia Mother    Irritable bowel syndrome Maternal Uncle        x 3   Heart disease Maternal Grandmother    Cancer Maternal Grandmother    Colon cancer Maternal Grandfather        unknown age   Cancer Maternal Grandfather        colon   Irritable bowel syndrome Cousin    Esophageal cancer Neg Hx    Rectal cancer Neg Hx    Stomach cancer Neg Hx     Social History   Socioeconomic History   Marital status: Married    Spouse name: Not on file   Number of children: 3   Years of education: Not on file   Highest education level: Bachelor's  degree (e.g., BA, AB, BS)  Occupational History   Occupation: Teacher, adult education: [employer]   Occupation: Engineer, maintenance (IT): [employer]  Tobacco Use   Smoking status: Never    Passive exposure: Past   Smokeless tobacco: Never  Vaping Use   Vaping status: Never Used  Substance and Sexual Activity   Alcohol use: Yes    Comment: 3 times a week   Drug use: No   Sexual activity: Yes    Birth control/protection: Surgical  Other Topics Concern   Not on file  Social History Narrative   Lives with her husband and youngest daughter.    Social Drivers of Corporate investment banker Strain: Low Risk  (12/18/2018)   Overall Financial Resource Strain (CARDIA)    Difficulty of Paying Living Expenses: Not hard at all  Food Insecurity: No Food Insecurity (01/10/2024)   Hunger Vital Sign    Worried About Running Out of Food in the Last Year: Never true    Ran Out of Food in the Last Year: Never true  Transportation Needs: No Transportation Needs (01/10/2024)   PRAPARE - Administrator, Civil Service (Medical): No    Lack of Transportation (Non-Medical): No  Physical Activity: Insufficiently Active (12/18/2018)   Exercise Vital Sign    Days of Exercise per Week: 3 days    Minutes of Exercise per Session: 30 min  Stress: Stress Concern Present (12/18/2018)   [employer] of Occupational Health - Occupational Stress Questionnaire    Feeling of Stress : Rather much  Social Connections: Moderately Isolated (01/10/2024)   Social Connection and Isolation Panel    Frequency of Communication with Friends and Family: More than three times a week    Frequency of Social Gatherings with Friends and Family: Twice a week    Attends Religious Services: Never    Database administrator or Organizations: No    Attends Banker Meetings: Never    Marital Status: Married  Catering manager Violence: Not At Risk (01/10/2024)   Humiliation, Afraid, Rape, and Kick  questionnaire    Fear of Current or Ex-Partner: No    Emotionally Abused: No    Physically Abused: No    Sexually Abused: No    ROS See HPI above    Objective  BP 116/76   Pulse 80   Temp 98.1 F (36.7 C) (Oral)   Ht 5' 7.5 (1.715 m)   Wt 190 lb (86.2 kg)   LMP 09/11/2023 (Approximate)   SpO2 96%  BMI 29.32 kg/m   Physical Exam Vitals reviewed.  Constitutional:      General: She is not in acute distress.    Appearance: Normal appearance. She is obese. She is not ill-appearing, toxic-appearing or diaphoretic.  HENT:     Head: Normocephalic and atraumatic.  Eyes:     General:        Right eye: No discharge.        Left eye: No discharge.     Conjunctiva/sclera: Conjunctivae normal.  Cardiovascular:     Rate and Rhythm: Normal rate and regular rhythm.     Heart sounds: Normal heart sounds. No murmur heard.    No friction rub. No gallop.  Pulmonary:     Effort: Pulmonary effort is normal. No respiratory distress.     Breath sounds: Normal breath sounds.  Musculoskeletal:        General: Normal range of motion.  Skin:    General: Skin is warm and dry.  Neurological:     General: No focal deficit present.     Mental Status: She is alert and oriented to person, place, and time. Mental status is at baseline.  Psychiatric:        Mood and Affect: Mood normal.        Behavior: Behavior normal.        Thought Content: Thought content normal.        Judgment: Judgment normal.     Assessment & Plan:  Chronic cough -     Benzonatate ; Take 1 capsule (200 mg total) by mouth 3 (three) times daily as needed for cough.  Dispense: 30 capsule; Refill: 1  Anxiety  Overweight (BMI 25.0-29.9) -     Comprehensive metabolic panel with GFR; Future -     Lipid panel; Future -     Hemoglobin A1c; Future -     TSH; Future -     CBC with Differential/Platelet; Future  Mixed hyperlipidemia -     Lipid panel; Future  Encounter to establish care  1.Review health  maintenance: -Cervical cancer screening: Request records from GYN  -Covid booster: Declines  -Hep B vaccine: Declines  -Influenza vaccine: May obtain when available  -PNA vaccine and Zoster: Declines  -Mammogram: [facility]-request records  2.Refilled Benzonatate  for chronic cough.  3.Continue all other medications.  4.Ordered labs (CBC, CMP, Lipid panel, A1c, and TSH) based on BMI-overweight and hyperlipidemia.  Return in about 6 months (around 07/12/2024) for physical; Lab appt for fasting within the next 3 weeks.   Ashima Shrake, NP

## 2024-01-19 ENCOUNTER — Other Ambulatory Visit (INDEPENDENT_AMBULATORY_CARE_PROVIDER_SITE_OTHER)

## 2024-01-19 DIAGNOSIS — E663 Overweight: Secondary | ICD-10-CM

## 2024-01-19 DIAGNOSIS — E782 Mixed hyperlipidemia: Secondary | ICD-10-CM | POA: Diagnosis not present

## 2024-01-19 LAB — COMPREHENSIVE METABOLIC PANEL WITH GFR
ALT: 49 U/L — ABNORMAL HIGH (ref 0–35)
AST: 36 U/L (ref 0–37)
Albumin: 4.3 g/dL (ref 3.5–5.2)
Alkaline Phosphatase: 50 U/L (ref 39–117)
BUN: 15 mg/dL (ref 6–23)
CO2: 29 meq/L (ref 19–32)
Calcium: 9.4 mg/dL (ref 8.4–10.5)
Chloride: 105 meq/L (ref 96–112)
Creatinine, Ser: 0.97 mg/dL (ref 0.40–1.20)
GFR: 67.57 mL/min
Glucose, Bld: 85 mg/dL (ref 70–99)
Potassium: 4.5 meq/L (ref 3.5–5.1)
Sodium: 144 meq/L (ref 135–145)
Total Bilirubin: 0.8 mg/dL (ref 0.2–1.2)
Total Protein: 6.6 g/dL (ref 6.0–8.3)

## 2024-01-19 LAB — LIPID PANEL
Cholesterol: 213 mg/dL — ABNORMAL HIGH (ref 0–200)
HDL: 44.7 mg/dL (ref >39.00)
LDL Cholesterol: 149 mg/dL — ABNORMAL HIGH (ref 0–99)
NonHDL: 168.56
Total CHOL/HDL Ratio: 5
Triglycerides: 96 mg/dL (ref 0.0–149.0)
VLDL: 19.2 mg/dL (ref 0.0–40.0)

## 2024-01-19 LAB — CBC WITH DIFFERENTIAL/PLATELET
Basophils Absolute: 0.1 K/uL (ref 0.0–0.1)
Basophils Relative: 1 % (ref 0.0–3.0)
Eosinophils Absolute: 0.7 K/uL (ref 0.0–0.7)
Eosinophils Relative: 12.4 % — ABNORMAL HIGH (ref 0.0–5.0)
HCT: 41.1 % (ref 36.0–46.0)
Hemoglobin: 14.2 g/dL (ref 12.0–15.0)
Lymphocytes Relative: 24.3 % (ref 12.0–46.0)
Lymphs Abs: 1.4 K/uL (ref 0.7–4.0)
MCHC: 34.6 g/dL (ref 30.0–36.0)
MCV: 87.1 fl (ref 78.0–100.0)
Monocytes Absolute: 0.4 K/uL (ref 0.1–1.0)
Monocytes Relative: 7.3 % (ref 3.0–12.0)
Neutro Abs: 3.1 K/uL (ref 1.4–7.7)
Neutrophils Relative %: 55 % (ref 43.0–77.0)
Platelets: 257 K/uL (ref 150.0–400.0)
RBC: 4.72 Mil/uL (ref 3.87–5.11)
RDW: 12.3 % (ref 11.5–15.5)
WBC: 5.6 K/uL (ref 4.0–10.5)

## 2024-01-19 LAB — HEMOGLOBIN A1C: Hgb A1c MFr Bld: 5.5 % (ref 4.6–6.5)

## 2024-01-19 LAB — TSH: TSH: 2.27 u[IU]/mL (ref 0.35–5.50)

## 2024-01-20 ENCOUNTER — Ambulatory Visit: Payer: Self-pay | Admitting: Family Medicine

## 2024-01-26 ENCOUNTER — Encounter: Payer: Self-pay | Admitting: Gastroenterology

## 2024-01-26 ENCOUNTER — Ambulatory Visit: Admitting: Gastroenterology

## 2024-01-26 VITALS — BP 90/60 | HR 68 | Ht 67.75 in | Wt 189.5 lb

## 2024-01-26 DIAGNOSIS — R490 Dysphonia: Secondary | ICD-10-CM | POA: Diagnosis not present

## 2024-01-26 DIAGNOSIS — K219 Gastro-esophageal reflux disease without esophagitis: Secondary | ICD-10-CM

## 2024-01-26 DIAGNOSIS — R09A2 Foreign body sensation, throat: Secondary | ICD-10-CM

## 2024-01-26 DIAGNOSIS — R053 Chronic cough: Secondary | ICD-10-CM

## 2024-01-26 DIAGNOSIS — Z860100 Personal history of colon polyps, unspecified: Secondary | ICD-10-CM

## 2024-01-26 MED ORDER — PANTOPRAZOLE SODIUM 40 MG PO TBEC: 40.0000 mg | DELAYED_RELEASE_TABLET | Freq: Every day | ORAL | 2 refills | Status: DC

## 2024-01-26 MED ORDER — PANTOPRAZOLE SODIUM 20 MG PO TBEC: 20.0000 mg | DELAYED_RELEASE_TABLET | Freq: Every day | ORAL | 2 refills | Status: DC

## 2024-01-26 MED ORDER — FAMOTIDINE 40 MG/5ML PO SUSR: 20.0000 mg | Freq: Every day | ORAL | 2 refills | Status: AC

## 2024-01-26 NOTE — Progress Notes (Addendum)
 Chief Complaint:acid reflux Primary GI Doctor:(previously Dr. Aneita) Dr. San   HPI:  Patient is a  53  year old female patient with past medical history of GERD, anxiety,depression,asthma, who was self referred to me or a evaluation of acid reflux. Patient last seen in [facility] on 09/16/22 for colon screening colonoscopy for history of polyps.  10/06/22 seen by cardiology for palpitations and family history of CAD. Reviewed entire note.  01/31/23 seen by pulmonology for follow-up on SOB and chest pain. Moderate persistent asthma, not well controlled. Started on Advair  and Flonase . Albuterol prn. Reviewed entire note  05/12/23 seen by allergist and had allergy  testing. Reviewed entire note.   Interval History    Patient presents for evaluation for GERD. Patient report she has had chronic cough, hoarseness, and globus sensation that has progressively gotten worse over the last 2 years.  Patient has been seen and evaluated by both allergist and pulmonologist.  Patient was treated with multiple inhalers for asthma as well as allergy  medication for seasonal allergies with no improvement.  Patient reports she has intermittently taken Nexium 20 mg over-the-counter which she feels when she takes the cough is less.  Patient inquired about concerns with taking long-term.  Patient denies dysphagia.  Patient had EGD with Dr. Aneita back in 2013 for epigastric pain that was unremarkable.   She drinks socially, sometimes no alcohol for 2-3 weeks, sometimes couple times a week.  Nonsmoker.   No blood thinners.   Patient's family history includes colon CA in maternal grandfather, no esophageal CA.  Wt Readings from Last 3 Encounters:  01/26/24 189 lb 8 oz (86 kg)  01/10/24 190 lb (86.2 kg)  05/03/23 184 lb (83.5 kg)    Past Medical History:  Diagnosis Date   Abnormal Pap smear of cervix    Since 2005 with biopsy under colposcopy   Allergy     Anxiety    Asthma    as child   Depression    GERD  (gastroesophageal reflux disease)    Panic attacks    Pure hypercholesterolemia 02/27/2018   Urticaria     Past Surgical History:  Procedure Laterality Date   CHOLECYSTECTOMY  age 60   crying, upset when waking up from anesthesia   FRACTURE SURGERY     KNEE ARTHROSCOPY     bilateral, multiple times   KNEE ARTHROSCOPY W/ ACL RECONSTRUCTION Left    in later 1990s   TONSILLECTOMY      Current Outpatient Medications  Medication Sig Dispense Refill   Ascorbic Acid (VITAMIN C PO) Take by mouth.     B Complex Vitamins (VITAMIN B COMPLEX) CAPS as directed Orally Once a day     benzonatate  (TESSALON ) 200 MG capsule Take 1 capsule (200 mg total) by mouth 3 (three) times daily as needed for cough. 30 capsule 1   busPIRone  (BUSPAR ) 10 MG tablet TAKE 1 TABLET (10 MG TOTAL) BY MOUTH 3 (THREE) TIMES DAILY. AS NEEDED FOR ANXIETY 270 tablet 1   Calcium-Magnesium-Vitamin D  (CALCIUM MAGNESIUM PO) Take by mouth daily.     estradiol (CLIMARA - DOSED IN MG/24 HR) 0.025 mg/24hr patch 0.025 mg once a week.     famotidine  (PEPCID ) 40 MG/5ML suspension Take 2.5 mLs (20 mg total) by mouth at bedtime. 100 mL 2   ibuprofen (ADVIL,MOTRIN) 200 MG tablet Take 200 mg by mouth as needed.     levocetirizine (XYZAL ) 5 MG tablet TAKE 1 TABLET BY MOUTH EVERY DAY IN THE EVENING 90 tablet  1   pantoprazole  (PROTONIX ) 20 MG tablet Take 1 tablet (20 mg total) by mouth daily. 30 tablet 2   polyethylene glycol powder (GLYCOLAX /MIRALAX ) powder Take 17 g by mouth 2 (two) times daily as needed. 578 g 1   Probiotic Product (PROBIOTIC DAILY PO) Take by mouth.     Vitamin E 180 MG (400 UNIT) CAPS 1 capsule Orally Once a day     Zinc 50 MG TABS 1 tablet Orally Once a day     No current facility-administered medications for this visit.    Allergies as of 01/26/2024 - Review Complete 01/26/2024  Allergen Reaction Noted   Buprenorphine hcl Nausea And Vomiting 01/01/2015   Morphine and codeine Nausea And Vomiting 10/22/2011    Other Other (See Comments) 10/22/2011   Sulfa antibiotics Nausea And Vomiting 07/21/2011   Sulfacetamide sodium Nausea And Vomiting 01/01/2015    Family History  Problem Relation Age of Onset   Thyroid  disease Mother    Hyperlipidemia Mother    Irritable bowel syndrome Maternal Uncle        x 3   Heart disease Maternal Grandmother    Cancer Maternal Grandmother    Colon cancer Maternal Grandfather        unknown age   Cancer Maternal Grandfather        colon   Irritable bowel syndrome Cousin    Esophageal cancer Neg Hx    Rectal cancer Neg Hx    Stomach cancer Neg Hx     Review of Systems:    Constitutional: No weight loss, fever, chills, weakness or fatigue HEENT: Eyes: No change in vision               Ears, Nose, Throat:  No change in hearing or congestion Skin: No rash or itching Cardiovascular: No chest pain, chest pressure or palpitations   Respiratory: No SOB or cough Gastrointestinal: See HPI and otherwise negative Genitourinary: No dysuria or change in urinary frequency Neurological: No headache, dizziness or syncope Musculoskeletal: No new muscle or joint pain Hematologic: No bleeding or bruising Psychiatric: No history of depression or anxiety    Physical Exam:  Vital signs: BP 90/60 (BP Location: Left Arm, Patient Position: Sitting, Cuff Size: Normal)   Pulse 68   Ht 5' 7.75 (1.721 m) Comment: height measured without shoes  Wt 189 lb 8 oz (86 kg)   LMP 09/11/2023 (Approximate)   BMI 29.03 kg/m   Constitutional:   Pleasant  female appears to be in NAD, Well developed, Well nourished, alert and cooperative Throat: Oral cavity and pharynx without inflammation, swelling or lesion.  Respiratory: Respirations even and unlabored. Lungs clear to auscultation bilaterally.   No wheezes, crackles, or rhonchi.  Cardiovascular: Normal S1, S2. Regular rate and rhythm. No peripheral edema, cyanosis or pallor.  Gastrointestinal:  Soft, nondistended, nontender. No  rebound or guarding. Normal bowel sounds. No appreciable masses or hepatomegaly. Rectal:  Not performed.  Msk:  Symmetrical without gross deformities. Without edema, no deformity or joint abnormality.  Neurologic:  Alert and  oriented x4;  grossly normal neurologically.  Skin:   Dry and intact without significant lesions or rashes.  RELEVANT LABS AND IMAGING: CBC    Latest Ref Rng & Units 01/19/2024    8:59 AM 11/16/2022    9:17 AM 03/27/2020   10:53 AM  CBC  WBC 4.0 - 10.5 K/uL 5.6  6.5  6.2   Hemoglobin 12.0 - 15.0 g/dL 85.7  86.2  86.0   Hematocrit  36.0 - 46.0 % 41.1  40.2  41.3   Platelets 150.0 - 400.0 K/uL 257.0  261.0  258      CMP     Latest Ref Rng & Units 01/19/2024    8:59 AM 10/06/2022   12:38 PM 03/27/2020   10:53 AM  CMP  Glucose 70 - 99 mg/dL 85  84  94   BUN 6 - 23 mg/dL 15  19  15    Creatinine 0.40 - 1.20 mg/dL 9.02  8.99  9.01   Sodium 135 - 145 mEq/L 144  140  142   Potassium 3.5 - 5.1 mEq/L 4.5  4.5  4.3   Chloride 96 - 112 mEq/L 105  101  105   CO2 19 - 32 mEq/L 29  25  23    Calcium 8.4 - 10.5 mg/dL 9.4  9.7  9.2   Total Protein 6.0 - 8.3 g/dL 6.6   6.6   Total Bilirubin 0.2 - 1.2 mg/dL 0.8   0.4   Alkaline Phos 39 - 117 U/L 50   50   AST 0 - 37 U/L 36   15   ALT 0 - 35 U/L 49  12  10      Lab Results  Component Value Date   TSH 2.27 01/19/2024   09/16/22 colonoscopy, recall 5 years - Two 7 mm polyps in the transverse colon, removed with a cold snare. Resected and retrieved. - External hemorrhoids. - The examination was otherwise normal on direct and retroflexion views. Path: Diagnosis Surgical [P], colon, transverse, polyp (2) - SESSILE SERRATED POLYP(S) WITHOUT CYTOLOGIC DYSPLASIA (MULTIPLE FRAGMENTS) - COLONIC MUCOSA WITH SURFACE HYPERPLASTIC CHANGE (2 FRAGMENTS) - NEGATIVE FOR MALIGNANCY 01/2017 colonoscopy - Non- thrombosed external hemorrhoids found on digital rectal exam. - Two 6 to 7 mm polyps in the sigmoid colon and in the transverse colon,  removed with a cold snare. Resected and retrieved. - Internal hemorrhoids. - The examination was otherwise normal on direct and retroflexion views. Path: Diagnosis Surgical [P], transverse and sigmoid, polyp (2) - SESSILE SERRATED POLYP (TWO FRAGMENTS). - NO DYSPLASIA OR MALIGNANCY. 09/2011 EGD Mild gastritis erythema Path: Surgical [P], gastric, bx - CHRONIC INACTIVE GASTRITIS. - THERE IS NO EVIDENCE OF HELICOBACTER PYLORI, GOBLET CELL METAPLASIA, DYSPLASIA, OR MALIGNANCY. - SEE COMMENT. Microscopic Comment A Warthin-Starry stain is negative for Helicobacter pylori organisms.  Assessment:     53 year old female patient who presents with symptoms of chronic cough, hoarseness, and globus sensation.  Patient has been evaluated by pulmonary and allergist.  Patient has received multiple treatments/inhalers for asthma as well as allergy  medication for seasonal allergies with no improvement in current symptoms.  Patient does report being on Nexium seems to lessen the episodes of coughing.  Will go ahead and restart patient on PPI in the morning and add Pepcid  at bedtime.  Reinforced strict GERD diet with no late meals 3 to 4 hours before lying down. TSH 2.27.  Family history of thyroid  disease in mother.  Will go ahead and proceed with upper GI endoscopy in [facility] with Dr. San to rule out esophagitis, EOE, or Barrett's.  Arabia Nylund consider impedance study and/or esophageal manometry if unremarkable.  Will defer to Dr. San.   History of precancerous polyps up-to-date on colonoscopy next due April 2029  Plan: - Recommend GERD diet, no late meals  - Switch to Pantoprazole  20 mg po daily (Nexium not covered) - Start Pepcid  at bedtime  -Schedule EGD in [facility] with Dr. San. The risks  and benefits of EGD with possible biopsies and esophageal dilation were discussed with the patient who agrees to proceed. -Colon recall 08/2027  ADDENDUM: per Dr. San recommendations will start patient on  Pantoprazole  40mg  po for [redacted] weeks along with Pepcid  at bedtime. Pt agrees to plan.   Thank you for the courtesy of this consult. Please call me with any questions or concerns.   Kaisha Wachob, FNP-C [facility] 01/26/2024, 11:11 AM  Cc: [facility]*

## 2024-01-26 NOTE — Progress Notes (Signed)
 Agree with the assessment and plan as outlined by [facility], FNP-C.  Agree with starting PPI (along with Pepcid ) for diagnostic and therapeutic intent.  I will typically start with high-dose therapy x 4 weeks and if symptoms completely resolve, this is a good clinical prognosticator, and can then titrate down to the lowest effective dose.  Will evaluate for clinical response at time of upper endoscopy along with evaluation for erosive esophagitis, LES laxity, hiatal hernia.  Kyvon Hu, DO, [facility]

## 2024-01-26 NOTE — Patient Instructions (Addendum)
 Recommend GERD diet, no late meals Start Pantoprazole  20 mg po daily, refilled Start Pepcid  at bedtime   You have been scheduled for an endoscopy. Please follow written instructions given to you at your visit today.  If you use inhalers (even only as needed), please bring them with you on the day of your procedure.  If you take any of the following medications, they will need to be adjusted prior to your procedure:   DO NOT TAKE 7 DAYS PRIOR TO TEST- Trulicity (dulaglutide) Ozempic, Wegovy (semaglutide) Mounjaro (tirzepatide) Bydureon Bcise (exanatide extended release)  DO NOT TAKE 1 DAY PRIOR TO YOUR TEST Rybelsus (semaglutide) Adlyxin (lixisenatide) Victoza (liraglutide) Byetta (exanatide) ___________________________________________________________________________   Due to recent changes in healthcare laws, you may see the results of your imaging and laboratory studies on MyChart before your provider has had a chance to review them.  We understand that in some cases there may be results that are confusing or concerning to you. Not all laboratory results come back in the same time frame and the provider may be waiting for multiple results in order to interpret others.  Please give us  48 hours in order for your provider to thoroughly review all the results before contacting the office for clarification of your results.   _______________________________________________________  If your blood pressure at your visit was 140/90 or greater, please contact your primary care physician to follow up on this.  _______________________________________________________  If you are age 33 or older, your body mass index should be between 23-30. Your Body mass index is 29.03 kg/m. If this is out of the aforementioned range listed, please consider follow up with your Primary Care Provider.  If you are age 73 or younger, your body mass index should be between 19-25. Your Body mass index is 29.03  kg/m. If this is out of the aformentioned range listed, please consider follow up with your Primary Care Provider.   ________________________________________________________  The [facility] providers would like to encourage you to use MYCHART to communicate with providers for non-urgent requests or questions.  Due to long hold times on the telephone, sending your provider a message by [facility] may be a faster and more efficient way to get a response.  Please allow 48 business hours for a response.  Please remember that this is for non-urgent requests.  _______________________________________________________  [facility] is using a team-based approach to care.  Your team is made up of your doctor and two to three APPS. Our APPS (Nurse Practitioners and Physician Assistants) work with your physician to ensure care continuity for you. They are fully qualified to address your health concerns and develop a treatment plan. They communicate directly with your gastroenterologist to care for you. Seeing the Advanced Practice Practitioners on your physician's team can help you by facilitating care more promptly, often allowing for earlier appointments, access to diagnostic testing, procedures, and other specialty referrals.   Thank you for trusting me with your gastrointestinal care. Deanna May, FNP-C

## 2024-01-26 NOTE — Addendum Note (Signed)
 Addended byBETHA LYNNANN ROCA on: 01/26/2024 03:38 PM   Modules accepted: Orders

## 2024-02-18 ENCOUNTER — Other Ambulatory Visit: Payer: Self-pay

## 2024-02-18 ENCOUNTER — Emergency Department (HOSPITAL_BASED_OUTPATIENT_CLINIC_OR_DEPARTMENT_OTHER)

## 2024-02-18 ENCOUNTER — Emergency Department (HOSPITAL_COMMUNITY)

## 2024-02-18 ENCOUNTER — Encounter (HOSPITAL_BASED_OUTPATIENT_CLINIC_OR_DEPARTMENT_OTHER): Payer: Self-pay | Admitting: Emergency Medicine

## 2024-02-18 ENCOUNTER — Emergency Department (HOSPITAL_BASED_OUTPATIENT_CLINIC_OR_DEPARTMENT_OTHER)
Admission: EM | Admit: 2024-02-18 | Discharge: 2024-02-18 | Disposition: A | Discharge status: Home / Self Care | Attending: Emergency Medicine | Admitting: Emergency Medicine

## 2024-02-18 DIAGNOSIS — R7309 Other abnormal glucose: Secondary | ICD-10-CM | POA: Diagnosis not present

## 2024-02-18 DIAGNOSIS — R209 Unspecified disturbances of skin sensation: Secondary | ICD-10-CM | POA: Diagnosis present

## 2024-02-18 DIAGNOSIS — J45909 Unspecified asthma, uncomplicated: Secondary | ICD-10-CM | POA: Diagnosis not present

## 2024-02-18 DIAGNOSIS — R200 Anesthesia of skin: Secondary | ICD-10-CM

## 2024-02-18 LAB — COMPREHENSIVE METABOLIC PANEL WITH GFR
ALT: 21 U/L (ref 0–44)
AST: 17 U/L (ref 15–41)
Albumin: 4.3 g/dL (ref 3.5–5.0)
Alkaline Phosphatase: 66 U/L (ref 38–126)
Anion gap: 12 (ref 5–15)
BUN: 11 mg/dL (ref 6–20)
CO2: 25 mmol/L (ref 22–32)
Calcium: 9.6 mg/dL (ref 8.9–10.3)
Chloride: 105 mmol/L (ref 98–111)
Creatinine, Ser: 1 mg/dL (ref 0.44–1.00)
GFR, Estimated: >60 mL/min (ref >60)
Glucose, Bld: 107 mg/dL — ABNORMAL HIGH (ref 70–99)
Potassium: 3.8 mmol/L (ref 3.5–5.1)
Sodium: 142 mmol/L (ref 135–145)
Total Bilirubin: 0.5 mg/dL (ref 0.0–1.2)
Total Protein: 7.1 g/dL (ref 6.5–8.1)

## 2024-02-18 LAB — URINALYSIS, ROUTINE W REFLEX MICROSCOPIC
Bilirubin Urine: NEGATIVE
Glucose, UA: NEGATIVE mg/dL
Hgb urine dipstick: NEGATIVE
Ketones, ur: NEGATIVE mg/dL
Nitrite: NEGATIVE
Protein, ur: NEGATIVE mg/dL
Specific Gravity, Urine: 1.01 (ref 1.005–1.030)
pH: 6 (ref 5.0–8.0)

## 2024-02-18 LAB — CBC
HCT: 39.7 % (ref 36.0–46.0)
Hemoglobin: 13.8 g/dL (ref 12.0–15.0)
MCH: 30.5 pg (ref 26.0–34.0)
MCHC: 34.8 g/dL (ref 30.0–36.0)
MCV: 87.8 fL (ref 80.0–100.0)
Platelets: 238 K/uL (ref 150–400)
RBC: 4.52 MIL/uL (ref 3.87–5.11)
RDW: 11.7 % (ref 11.5–15.5)
WBC: 6.4 K/uL (ref 4.0–10.5)
nRBC: 0 % (ref 0.0–0.2)

## 2024-02-18 LAB — CBG MONITORING, ED: Glucose-Capillary: 121 mg/dL — ABNORMAL HIGH (ref 70–99)

## 2024-02-18 LAB — MAGNESIUM: Magnesium: 2.1 mg/dL (ref 1.7–2.4)

## 2024-02-18 MED ORDER — CEPHALEXIN 500 MG PO CAPS: 1000.0000 mg | ORAL_CAPSULE | Freq: Two times a day (BID) | ORAL | 0 refills | Status: DC

## 2024-02-18 MED ORDER — CEFPODOXIME PROXETIL 200 MG PO TABS: 200.0000 mg | ORAL_TABLET | Freq: Two times a day (BID) | ORAL | 0 refills | Status: AC

## 2024-02-18 MED ORDER — METOCLOPRAMIDE HCL 5 MG/ML IJ SOLN
10.0000 mg | Freq: Once | INTRAMUSCULAR | Status: AC
Start: 2024-02-18 — End: 2024-02-18
  Administered 2024-02-18: 10 mg via INTRAVENOUS
  Filled 2024-02-18: qty 2

## 2024-02-18 MED ORDER — DIPHENHYDRAMINE HCL 25 MG PO CAPS
25.0000 mg | ORAL_CAPSULE | Freq: Once | ORAL | Status: AC
Start: 2024-02-18 — End: 2024-02-18
  Administered 2024-02-18: 25 mg via ORAL
  Filled 2024-02-18: qty 1

## 2024-02-18 MED ORDER — SODIUM CHLORIDE 0.9 % IV BOLUS
1000.0000 mL | Freq: Once | INTRAVENOUS | Status: AC
  Administered 2024-02-18: 1000 mL via INTRAVENOUS

## 2024-02-18 NOTE — ED Triage Notes (Signed)
 Pt sent from [facility] for eval due to left arm and facial numbness since around 2pm this afternoon. No obvious neuro deficit in triage. Pt denies pain. Currently taking abx for kidney infection. Hx anxiety and reports feeling anxious now.

## 2024-02-18 NOTE — Discharge Instructions (Addendum)
 Your MRI was negative I suspect that the paresthesias/numbness and sensation changes you have been experiencing are related to the ciprofloxacin that you were prescribed for your UTI.  I have prescribed you Cefpodoxime please take this as prescribed instead of ciprofloxacin.   Your MRI brain was unremarkable.  No evidence of stroke.  You can take Tylenol for any pain.  For pain, you can take 1000 mg of Tylenol or 1 g of Tylenol every 6-8 hours.  Do not exceed more than 4000 mg or 4 g in a 24-hour period.  You can also take ibuprofen 600 to 800 mg every 6-8 hours as well.  Do not take this high-dose ibuprofen for greater than a week.

## 2024-02-18 NOTE — ED Provider Notes (Signed)
 [place] [facility] AT [facility] Provider Note   CSN: [id number] Arrival date & time: 02/18/24  1704     Patient presents with: Numbness   Debbie Shaw is a 53 y.o. female.   HPI  Patient is a 53 year old female with a past medical history significant for hypercholesterolemia, fatigue, asthma  She is a non-smoker  Patient presents to the emergency room today with left arm and left face numbness she describes as a decree sensation and a sensation that things feel different.  She denies any weakness slurred speech no confusion no head injuries denies any visual field changes.  No nausea or vomiting no chest pain difficulty breathing no abdominal pain fevers or chills.  She states she feels overall well.  She was diagnosed yesterday with pyelonephritis and started on ciprofloxacin she states that she was having flank pain from this but her symptoms have dramatically improved since yesterday.  She has only taken a couple of doses of ciprofloxacin so far.     Prior to Admission medications   Medication Sig Start Date End Date Taking? Authorizing Provider  cephALEXin (KEFLEX) 500 MG capsule Take 2 capsules (1,000 mg total) by mouth 2 (two) times daily for 10 days. 02/18/24 02/28/24 Yes Taleshia Luff S, PA  Ascorbic Acid (VITAMIN C PO) Take by mouth.    [provider]  B Complex Vitamins (VITAMIN B COMPLEX) CAPS as directed Orally Once a day    [provider]  benzonatate  (TESSALON ) 200 MG capsule Take 1 capsule (200 mg total) by mouth 3 (three) times daily as needed for cough. 01/10/24   Billy Philippe SAUNDERS, NP  busPIRone  (BUSPAR ) 10 MG tablet TAKE 1 TABLET (10 MG TOTAL) BY MOUTH 3 (THREE) TIMES DAILY. AS NEEDED FOR ANXIETY 01/09/19   Kip Ade, NP  Calcium-Magnesium-Vitamin D  (CALCIUM MAGNESIUM PO) Take by mouth daily.    [provider]  estradiol (CLIMARA - DOSED IN MG/24 HR) 0.025 mg/24hr patch 0.025 mg once a week.  07/24/22   [provider]  famotidine  (PEPCID ) 40 MG/5ML suspension Take 2.5 mLs (20 mg total) by mouth at bedtime. 01/26/24   May, Deanna J, NP  ibuprofen (ADVIL,MOTRIN) 200 MG tablet Take 200 mg by mouth as needed.    [provider]  levocetirizine (XYZAL ) 5 MG tablet TAKE 1 TABLET BY MOUTH EVERY DAY IN THE EVENING 10/28/23   Iva Marty Saltness, MD  pantoprazole  (PROTONIX ) 40 MG tablet Take 1 tablet (40 mg total) by mouth daily. 01/26/24   May, Deanna J, NP  polyethylene glycol powder (GLYCOLAX /MIRALAX ) powder Take 17 g by mouth 2 (two) times daily as needed. 03/07/18   McVey, Almarie Folks, PA-C  Probiotic Product (PROBIOTIC DAILY PO) Take by mouth.    [provider]  Vitamin E 180 MG (400 UNIT) CAPS 1 capsule Orally Once a day    [provider]  Zinc 50 MG TABS 1 tablet Orally Once a day    [provider]    Allergies: Buprenorphine hcl, Morphine and codeine, Other, Sulfa antibiotics, and Sulfacetamide sodium    Review of Systems  Updated Vital Signs BP (!) 144/95 (BP Location: Right Arm)   Pulse 68   Temp 98.4 F (36.9 C) (Oral)   Resp 17   Ht 5' 7.5 (1.715 m)   Wt 85.7 kg   SpO2 100%   BMI 29.16 kg/m   Physical Exam Vitals and nursing note reviewed.  Constitutional:      General: She  is not in acute distress. HENT:     Head: Normocephalic and atraumatic.     Nose: Nose normal.  Eyes:     General: No scleral icterus. Cardiovascular:     Rate and Rhythm: Normal rate and regular rhythm.     Pulses: Normal pulses.     Heart sounds: Normal heart sounds.  Pulmonary:     Effort: Pulmonary effort is normal. No respiratory distress.     Breath sounds: No wheezing.  Abdominal:     Palpations: Abdomen is soft.     Tenderness: There is no abdominal tenderness.  Musculoskeletal:     Cervical back: Normal range of motion.     Right lower leg: No edema.     Left lower leg: No edema.  Skin:    General: Skin is warm and dry.      Capillary Refill: Capillary refill takes less than 2 seconds.  Neurological:     Mental Status: She is alert. Mental status is at baseline.     Comments: Alert and oriented to self, place, time and event.   Speech is fluent, clear without dysarthria or dysphasia.   Strength 5/5 in upper/lower extremities   Sensation intact in upper/lower extremities however seems to be decreased in dorsum of the left forearm between wrist and elbow  No pronator drift.  Normal finger-to-nose and feet tapping.  CN I not tested  CN II grossly intact visual fields bilaterally. Did not visualize posterior eye.  CN III, IV, VI PERRLA and EOMs intact bilaterally  CN V Intact sensation to bilateral sides of face however decreased sensation to left face CN VII facial movements symmetric  CN VIII not tested  CN IX, X no uvula deviation, symmetric rise of soft palate  CN XI 5/5 SCM and trapezius strength bilaterally  CN XII Midline tongue protrusion, symmetric L/R movements     Psychiatric:        Mood and Affect: Mood normal.        Behavior: Behavior normal.     (all labs ordered are listed, but only abnormal results are displayed) Labs Reviewed  URINALYSIS, ROUTINE W REFLEX MICROSCOPIC - Abnormal; Notable for the following components:      Result Value   Leukocytes,Ua TRACE (*)    Bacteria, UA RARE (*)    All other components within normal limits  COMPREHENSIVE METABOLIC PANEL WITH GFR - Abnormal; Notable for the following components:   Glucose, Bld 107 (*)    All other components within normal limits  CBG MONITORING, ED - Abnormal; Notable for the following components:   Glucose-Capillary 121 (*)    All other components within normal limits  CBC  MAGNESIUM    EKG: EKG Interpretation Date/Time:  Saturday February 18 2024 17:15:24 EDT Ventricular Rate:  67 PR Interval:  114 QRS Duration:  100 QT Interval:  412 QTC Calculation: 435 R Axis:   -35  Text Interpretation: Normal sinus  rhythm Left axis deviation Minimal voltage criteria for LVH, may be normal variant ( Cornell product ) Abnormal ECG When compared with ECG of 21-Jun-2010 20:28, QRS axis Shifted left Criteria for Septal infarct are no longer Present Confirmed by Yolande Charleston [phone]) on 02/18/2024 6:42:05 PM  Radiology: CT Head Wo Contrast Result Date: 02/18/2024 CLINICAL DATA:  Left facial and forearm numbness EXAM: CT HEAD WITHOUT CONTRAST TECHNIQUE: Contiguous axial images were obtained from the base of the skull through the vertex without intravenous contrast. RADIATION DOSE REDUCTION: This exam was performed  according to the departmental dose-optimization program which includes automated exposure control, adjustment of the mA and/or kV according to patient size and/or use of iterative reconstruction technique. COMPARISON:  07/14/2018 FINDINGS: Brain: No acute infarct or hemorrhage. Lateral ventricles and midline structures are unremarkable. No acute extra-axial fluid collections. No mass effect. Vascular: No hyperdense vessel or unexpected calcification. Skull: Normal. Negative for fracture or focal lesion. Sinuses/Orbits: No acute finding. Other: None. IMPRESSION: 1. No acute intracranial process. Electronically Signed   By: Ozell Daring M.D.   On: 02/18/2024 18:39     Procedures   Medications Ordered in the ED  metoCLOPramide (REGLAN) injection 10 mg (10 mg Intravenous Given 02/18/24 1802)  diphenhydrAMINE (BENADRYL) capsule 25 mg (25 mg Oral Given 02/18/24 1801)  sodium chloride  0.9 % bolus 1,000 mL (0 mLs Intravenous Stopped 02/18/24 1856)    Clinical Course as of 02/18/24 1907  Sat Feb 18, 2024  1743 Yesterday dx w pyelo on cipro  Today woke up from nap around 1pm [WF]    Clinical Course User Index [WF] Neldon Hamp RAMAN, PA                                 Medical Decision Making Amount and/or Complexity of Data Reviewed Labs: ordered. Radiology: ordered.  Risk Prescription drug  management.   This patient presents to the ED for concern of sensory changes, this involves a number of treatment options, and is a complaint that carries with it a moderate to high risk of complications and morbidity. A differential diagnosis was considered for the patient's symptoms which is discussed below:   The differential diagnosis of weakness includes but is not limited to neurologic causes (GBS, myasthenia gravis, CVA, MS, ALS, transverse myelitis, spinal cord injury, CVA, botulism, ) and other causes: ACS, Arrhythmia, syncope, orthostatic hypotension, sepsis, hypoglycemia, electrolyte disturbance, hypothyroidism, respiratory failure, symptomatic anemia, dehydration, heat injury, polypharmacy, malignancy.    Co morbidities: Discussed in HPI   Brief History:  Patient is a 52 year old female with a past medical history significant for hypercholesterolemia, fatigue, asthma  She is a non-smoker  Patient presents to the emergency room today with left arm and left face numbness she describes as a decree sensation and a sensation that things feel different.  She denies any weakness slurred speech no confusion no head injuries denies any visual field changes.  No nausea or vomiting no chest pain difficulty breathing no abdominal pain fevers or chills.  She states she feels overall well.  She was diagnosed yesterday with pyelonephritis and started on ciprofloxacin she states that she was having flank pain from this but her symptoms have dramatically improved since yesterday.  She has only taken a couple of doses of ciprofloxacin so far.    EMR reviewed including pt PMHx, past surgical history and past visits to ER.   See HPI for more details   Lab Tests:   I personally reviewed all laboratory work and imaging. Metabolic panel without any acute abnormality specifically kidney function within normal limits and no significant electrolyte abnormalities. CBC without leukocytosis or  significant anemia.   Imaging Studies:  NAD. I personally reviewed all imaging studies and no acute abnormality found. I agree with radiology interpretation. CT head unremarkable  MRI brain ordered and pending   Cardiac Monitoring:  The patient was maintained on a cardiac monitor.  I personally viewed and interpreted the cardiac monitored which showed an underlying rhythm  of: NSR EKG non-ischemic   Medicines ordered:  I ordered medication including 1 L normal saline, Reglan, Benadryl for headache headache Reevaluation of the patient after these medicines showed that the patient improved I have reviewed the patients home medicines and have made adjustments as needed   Critical Interventions:   MRI rule out stroke   Consults/Attending Physician   I discussed this case with my attending physician who cosigned this note including patient's presenting symptoms, physical exam, and planned diagnostics and interventions. Attending physician stated agreement with plan or made changes to plan which were implemented.   Reevaluation:  After the interventions noted above I re-evaluated patient and found that they have :improved   Social Determinants of Health:      Problem List / ED Course:  Patient with somewhat improving left face and left arm sensory changes.  CT head and labs obtained unremarkable patient is headache is improving and sensory changes seem to be resolving however concern for stroke prompting MRI brain this is not available in the emergency department at [facility] currently therefore we will transfer to [facility] for MRI brain.  If negative anticipate discharge home with changed antibiotic to Keflex.  If positive will need admission for acute stroke MAR my hand on ciprofloxacin. Discussed with Dr. Cottie who accepts in transfer -- pt will go POV.    Dispostion:    Final diagnoses:  Facial numbness  Arm numbness    ED Discharge Orders           Ordered    cephALEXin (KEFLEX) 500 MG capsule  2 times daily        02/18/24 1900               Neldon Hamp RAMAN, [place] 02/18/24 1907    Yolande Lamar BROCKS, MD 02/22/24 [phone]

## 2024-02-18 NOTE — ED Notes (Signed)
 Patient ER to ER transfer to [facility]  Knows to go straight to [facility] ER Dr Cottie accepting.  PIV secured with [employer].  Husband driving

## 2024-02-18 NOTE — ED Provider Notes (Signed)
  Physical Exam  BP 122/85 (BP Location: Right Arm)   Pulse 62   Temp 98.1 F (36.7 C)   Resp 16   Ht 5' 7.5 (1.715 m)   Wt 85.7 kg   SpO2 100%   BMI 29.16 kg/m   Physical Exam Vitals and nursing note reviewed.  Constitutional:      General: She is not in acute distress.    Appearance: She is well-developed.  HENT:     Head: Normocephalic and atraumatic.  Eyes:     Conjunctiva/sclera: Conjunctivae normal.  Cardiovascular:     Rate and Rhythm: Normal rate and regular rhythm.     Heart sounds: No murmur heard. Pulmonary:     Effort: Pulmonary effort is normal. No respiratory distress.     Breath sounds: Normal breath sounds.  Abdominal:     Palpations: Abdomen is soft.     Tenderness: There is no abdominal tenderness.  Musculoskeletal:        General: No swelling.     Cervical back: Neck supple.  Skin:    General: Skin is warm and dry.     Capillary Refill: Capillary refill takes less than 2 seconds.  Neurological:     Mental Status: She is alert.  Psychiatric:        Mood and Affect: Mood normal.     Procedures  Procedures  ED Course / MDM   Clinical Course as of 02/18/24 2021  Sat Feb 18, 2024  1743 Yesterday dx w pyelo on cipro  Today woke up from nap around 1pm [WF]    Clinical Course User Index [WF] Neldon Hamp RAMAN, PA   Medical Decision Making Amount and/or Complexity of Data Reviewed Labs: ordered. Radiology: ordered.  Risk Prescription drug management.   Was transferred to our ED in the setting of left arm and left face numbness.  He was sent to the ED for MRI brain.   Examined the patient.  Neurologically intact.  Patient was having some sensory changes that she stated was in her left upper face as well as left lower face and left arm.  Given involvement of left upper face, very little concern for any kind of CVA.  Supported by negative MRI brain.  MRI brain completely unremarkable.  Reexamined the patient, neurologically intact.  I  think likely experiencing some sensory changes from migraine.  Did discharge the patient on cefpodoxime given that she has been treated for pyelonephritis concern for possible fluoroquinolone reaction.  Patient discharged in stable condition.     Simon Lavonia SAILOR, MD 02/18/24 [phone]

## 2024-02-18 NOTE — ED Notes (Signed)
 Pt has been given water to drink

## 2024-02-18 NOTE — ED Notes (Signed)
 Handoff report given to Jamie Blue RN charge at [facility]

## 2024-02-21 ENCOUNTER — Ambulatory Visit: Payer: Self-pay | Admitting: *Deleted

## 2024-02-21 NOTE — Telephone Encounter (Signed)
 Wed and Thur. She was not feeling well.  Thur. Night she had a lot of lower back pain.   I went to urgent care.   They said there was blood and bacteria in my urine.  My urine also smelled bad.  They thought it was an infection from UTI that had gone to my kidney.  They prescribed Cipro.   (My urine still smells funny this morning).  I'm having a headache through all this too.   This was Friday.   Sat. Afternoon I woke up with the left side of my face and left arm  numb.   I called urgent care back and they had me come back in.   They did an EKG and   thought I was having a mild heart attack.  I went to the [facility] location ED.   My face and arm still felt weird when I got there.  They sent me to MRI at [facility] to be sure I was not having a stroke.   It was negative.   The [facility] doctor said they think I had a reaction to the Cipro.     They gave me a new antibiotic.    I went home and took the Cephalexin on Sunday.   I felt ok but was run down.   I woke up yesterday (Monday) feeling like I was having the flu.   I'm having hot flashes, anxiety, and not feeling right.    I'm wondering if it's the Cephalexin.   I don't know what is going on with me.   I feel off.  I'm nauseas and super anxious.    Cephalexin is what I'm on now.   I'm afraid to take it this morning because I'm afraid of the side effects and how I'm feeling with terrible hot flashes, anxiety, and all while on this antibiotic.   I don't know what to do.    Should I take this this morning or not?   See triage notes for details.     Pt seen in ED 02/18/2024 but was seen in urgent care prior to ED visit.   (See above) Needing a hospital follow up visit with Philippe Slade, NP.  Due to symptoms needing to be seen within 24 hours if possible.  Is it possible she can be worked in? She is also seeking advice on whether she should take the antibiotic this morning or not.   I let her know Philippe Slade, NP would have to advise  her on this.    She is afraid to take it due to side effects she has already had to Cipro, see triage notes, and now on Cephalexin. She is still having anxiety and not feeling well, headaches and terrible hot flashes with this medication too.  Should she take the Cephalexin this morning or continue taking it?   Seeking advice and needing a hospital follow up.      FYI Only or Action Required?: Action required by provider: clinical question for provider.  Patient was last seen in primary care on 01/10/2024 by Slade Philippe SAUNDERS, NP.  Called Nurse Triage reporting Medication Problem.  Symptoms began several days ago.  Interventions attempted: Prescription medications: Cephalexin.  Symptoms are: unchanged.  Triage Disposition: See Physician Within 24 Hours Needing a hospital follow up appt.   Needing to be seen within 24 hours due to symptoms.   See above notes.   Seeking advice on whether she should take the  Cephalexin this morning or not?   Requesting a call back.  Patient/caregiver understands and will follow disposition?: Yes  Cipro was the first antibiotic that made me feel numb in my face and arm.    No longer having the facial and arm numbness.   Sunday the numbness was gone. Usually I'm fine but this anxiousness is bad.   I have anxiety but I feel off.   Is this my medicine? Or what?    I'm having terrible hot flashes and anxiety.

## 2024-02-21 NOTE — Telephone Encounter (Signed)
 Copied from CRM #[id number]. Topic: Clinical - Red Word Triage >> Feb 21, 2024  8:25 AM Rosina BIRCH wrote: Reason for RMF:[id number] called stating she went to the urgent care because her face and arm went numb and then they sent her to an emergency room. They changed her antibotics and she is still not feeling well. Patient states she feels weird and is having hot flashes, nausea and feel like she is on edge Reason for Disposition . [1] Caller has URGENT medicine question about med that primary care doctor (or NP/PA) or specialist prescribed AND [2] triager unable to answer question    Reaction to Cipro now on Cephalexin.    Still not feeling well    See notes for details.  Appt  being made.  Answer Assessment - Initial Assessment Questions 1. NAME of MEDICINE: What medicine(s) are you calling about?     Cipro and now on Cephalexin.   (See notes for details of what happened) 2. QUESTION: What is your question? (e.g., double dose of medicine, side effect)     I'm worried about having side effects from the Cephalexin now.   I'm having hot flashes, headaches and anxiety real bad. 3. PRESCRIBER: Who prescribed the medicine? Reason: if prescribed by specialist, call should be referred to that group.     Hospital ED put on the Cephalexin  4. SYMPTOMS: Do you have any symptoms? If Yes, ask: What symptoms are you having?  How bad are the symptoms (e.g., mild, moderate, severe)     Anxiety, hot flashes, still not feeling well.   Wondering if it's a reaction to the Cephalexin.     Requesting to be seen. 5. PREGNANCY:  Is there any chance that you are pregnant? When was your last menstrual period?     Not asked  Protocols used: Medication Question Call-A-AH

## 2024-03-14 ENCOUNTER — Encounter: Admitting: Gastroenterology

## 2024-03-19 ENCOUNTER — Encounter: Payer: Self-pay | Admitting: Gastroenterology

## 2024-03-26 ENCOUNTER — Encounter: Payer: Self-pay | Admitting: Gastroenterology

## 2024-03-26 ENCOUNTER — Ambulatory Visit: Admitting: Gastroenterology

## 2024-03-26 VITALS — BP 116/75 | HR 62 | Temp 97.7°F | Resp 12 | Ht 67.75 in | Wt 189.8 lb

## 2024-03-26 DIAGNOSIS — K219 Gastro-esophageal reflux disease without esophagitis: Secondary | ICD-10-CM | POA: Diagnosis not present

## 2024-03-26 DIAGNOSIS — R490 Dysphonia: Secondary | ICD-10-CM

## 2024-03-26 DIAGNOSIS — R09A2 Foreign body sensation, throat: Secondary | ICD-10-CM

## 2024-03-26 DIAGNOSIS — K2950 Unspecified chronic gastritis without bleeding: Secondary | ICD-10-CM

## 2024-03-26 DIAGNOSIS — R1013 Epigastric pain: Secondary | ICD-10-CM

## 2024-03-26 DIAGNOSIS — R053 Chronic cough: Secondary | ICD-10-CM

## 2024-03-26 MED ORDER — SODIUM CHLORIDE 0.9 % IV SOLN: 500.0000 mL | INTRAVENOUS | Status: DC

## 2024-03-26 NOTE — Progress Notes (Signed)
 GASTROENTEROLOGY PROCEDURE H&P NOTE   Primary Care Physician: Billy Philippe SAUNDERS, NP    Reason for Procedure:   GERD, chronic cough, hoarseness, globus sensation  Plan:    EGD  Patient is appropriate for endoscopic procedure(s) in the ambulatory ([facility]) setting.  The nature of the procedure, as well as the risks, benefits, and alternatives were carefully and thoroughly reviewed with the patient. Ample time for discussion and questions allowed. The patient understood, was satisfied, and agreed to proceed.     HPI: Debbie Shaw is a 53 y.o. female who presents for EGD for evaluation of suspected reflux sxs, to include chronic cough, globus sensation, hoarseness.   Overall improvement with trial of high dose PPI with Protonix  40 mg BID x4 weeks and Pepcid  40 mg at night. Now on Protonix  40 mg daily. Still with daily symptoms.   Past Medical History:  Diagnosis Date   Abnormal Pap smear of cervix    Since 2005 with biopsy under colposcopy   Allergy     Anxiety    Asthma    as child   Depression    GERD (gastroesophageal reflux disease)    Panic attacks    Pure hypercholesterolemia 02/27/2018   Urticaria     Past Surgical History:  Procedure Laterality Date   CHOLECYSTECTOMY  age 48   crying, upset when waking up from anesthesia   FRACTURE SURGERY     KNEE ARTHROSCOPY     bilateral, multiple times   KNEE ARTHROSCOPY W/ ACL RECONSTRUCTION Left    in later 1990s   TONSILLECTOMY      Prior to Admission medications   Medication Sig Start Date End Date Taking? Authorizing Provider  B Complex Vitamins (VITAMIN B COMPLEX) CAPS as directed Orally Once a day   Yes [provider]  benzonatate  (TESSALON ) 200 MG capsule Take 1 capsule (200 mg total) by mouth 3 (three) times daily as needed for cough. 01/10/24  Yes Billy Philippe SAUNDERS, NP  Calcium-Magnesium-Vitamin D  (CALCIUM MAGNESIUM PO) Take by mouth daily.   Yes [provider]  estradiol  (VIVELLE-DOT) 0.025 MG/24HR 1 patch to skin Transdermal Two times a Week for 90 days 05/27/23  Yes [provider]  levocetirizine (XYZAL ) 5 MG tablet TAKE 1 TABLET BY MOUTH EVERY DAY IN THE EVENING 10/28/23  Yes Gallagher, Joel Louis, MD  medroxyPROGESTERone (PROVERA) 10 MG tablet 1 tablet with food Orally Once a day; Duration: 30 day(s) 10/23/21  Yes [provider]  pantoprazole  (PROTONIX ) 40 MG tablet Take 1 tablet (40 mg total) by mouth daily. 01/26/24  Yes May, Deanna J, NP  polyethylene glycol powder (GLYCOLAX /MIRALAX ) powder Take 17 g by mouth 2 (two) times daily as needed. 03/07/18  Yes McVey, Almarie Folks, PA-C  Probiotic Product (PROBIOTIC DAILY PO) Take by mouth.   Yes [provider]  Vitamin E 180 MG (400 UNIT) CAPS 1 capsule Orally Once a day   Yes [provider]  busPIRone  (BUSPAR ) 10 MG tablet TAKE 1 TABLET (10 MG TOTAL) BY MOUTH 3 (THREE) TIMES DAILY. AS NEEDED FOR ANXIETY 01/09/19   Kip Ade, NP  estradiol (CLIMARA - DOSED IN MG/24 HR) 0.025 mg/24hr patch 0.025 mg once a week. Patient not taking: Reported on 03/26/2024 07/24/22   [provider]  famotidine  (PEPCID ) 40 MG/5ML suspension Take 2.5 mLs (20 mg total) by mouth at bedtime. Patient not taking: Reported on 03/26/2024 01/26/24   May, Deanna J, NP  ibuprofen (ADVIL,MOTRIN) 200 MG tablet Take 200  mg by mouth as needed.    [provider]    Current Outpatient Medications  Medication Sig Dispense Refill   B Complex Vitamins (VITAMIN B COMPLEX) CAPS as directed Orally Once a day     benzonatate  (TESSALON ) 200 MG capsule Take 1 capsule (200 mg total) by mouth 3 (three) times daily as needed for cough. 30 capsule 1   Calcium-Magnesium-Vitamin D  (CALCIUM MAGNESIUM PO) Take by mouth daily.     estradiol (VIVELLE-DOT) 0.025 MG/24HR 1 patch to skin Transdermal Two times a Week for 90 days     levocetirizine (XYZAL ) 5 MG tablet TAKE 1 TABLET BY MOUTH EVERY DAY IN THE EVENING 90  tablet 1   medroxyPROGESTERone (PROVERA) 10 MG tablet 1 tablet with food Orally Once a day; Duration: 30 day(s)     pantoprazole  (PROTONIX ) 40 MG tablet Take 1 tablet (40 mg total) by mouth daily. 30 tablet 2   polyethylene glycol powder (GLYCOLAX /MIRALAX ) powder Take 17 g by mouth 2 (two) times daily as needed. 578 g 1   Probiotic Product (PROBIOTIC DAILY PO) Take by mouth.     Vitamin E 180 MG (400 UNIT) CAPS 1 capsule Orally Once a day     busPIRone  (BUSPAR ) 10 MG tablet TAKE 1 TABLET (10 MG TOTAL) BY MOUTH 3 (THREE) TIMES DAILY. AS NEEDED FOR ANXIETY 270 tablet 1   estradiol (CLIMARA - DOSED IN MG/24 HR) 0.025 mg/24hr patch 0.025 mg once a week. (Patient not taking: Reported on 03/26/2024)     famotidine  (PEPCID ) 40 MG/5ML suspension Take 2.5 mLs (20 mg total) by mouth at bedtime. (Patient not taking: Reported on 03/26/2024) 100 mL 2   ibuprofen (ADVIL,MOTRIN) 200 MG tablet Take 200 mg by mouth as needed.     Current Facility-Administered Medications  Medication Dose Route Frequency Provider Last Rate Last Admin   0.9 %  sodium chloride  infusion  500 mL Intravenous Continuous Kelbie Moro V, DO        Allergies as of 03/26/2024 - Review Complete 03/26/2024  Allergen Reaction Noted   Ciprofloxacin Other (See Comments) 03/26/2024   Other Other (See Comments) 10/22/2011   Buprenorphine hcl Nausea And Vomiting 01/01/2015   Morphine and codeine Nausea And Vomiting 10/22/2011   Sulfa antibiotics Nausea And Vomiting 07/21/2011   Sulfacetamide sodium Nausea And Vomiting 01/01/2015    Family History  Problem Relation Age of Onset   Thyroid  disease Mother    Hyperlipidemia Mother    Irritable bowel syndrome Maternal Uncle        x 3   Heart disease Maternal Grandmother    Cancer Maternal Grandmother    Colon cancer Maternal Grandfather        unknown age   Cancer Maternal Grandfather        colon   Irritable bowel syndrome Cousin    Esophageal cancer Neg Hx    Rectal cancer Neg Hx     Stomach cancer Neg Hx     Social History   Socioeconomic History   Marital status: Married    Spouse name: Not on file   Number of children: 3   Years of education: Not on file   Highest education level: Bachelor's degree (e.g., BA, AB, BS)  Occupational History   Occupation: Teacher, Adult Education: [employer]   Occupation: Engineer, Maintenance (it): [employer]  Tobacco Use   Smoking status: Never    Passive exposure: Past   Smokeless tobacco: Never  Vaping Use   Vaping status: Never Used  Substance and Sexual Activity   Alcohol use: Yes    Comment: 3 times a week   Drug use: No   Sexual activity: Yes    Birth control/protection: Surgical  Other Topics Concern   Not on file  Social History Narrative   Lives with her husband and youngest daughter.    Social Drivers of Corporate Investment Banker Strain: Low Risk  (12/18/2018)   Overall Financial Resource Strain (CARDIA)    Difficulty of Paying Living Expenses: Not hard at all  Food Insecurity: No Food Insecurity (01/10/2024)   Hunger Vital Sign    Worried About Running Out of Food in the Last Year: Never true    Ran Out of Food in the Last Year: Never true  Transportation Needs: No Transportation Needs (01/10/2024)   PRAPARE - Administrator, Civil Service (Medical): No    Lack of Transportation (Non-Medical): No  Physical Activity: Insufficiently Active (12/18/2018)   Exercise Vital Sign    Days of Exercise per Week: 3 days    Minutes of Exercise per Session: 30 min  Stress: Stress Concern Present (12/18/2018)   [employer] of Occupational Health - Occupational Stress Questionnaire    Feeling of Stress : Rather much  Social Connections: Moderately Isolated (01/10/2024)   Social Connection and Isolation Panel    Frequency of Communication with Friends and Family: More than three times a week    Frequency of Social Gatherings with Friends and Family: Twice a week    Attends Religious  Services: Never    Database Administrator or Organizations: No    Attends Banker Meetings: Never    Marital Status: Married  Catering Manager Violence: Not At Risk (01/10/2024)   Humiliation, Afraid, Rape, and Kick questionnaire    Fear of Current or Ex-Partner: No    Emotionally Abused: No    Physically Abused: No    Sexually Abused: No    Physical Exam: Vital signs in last 24 hours: @BP  132/79   Pulse 65   Temp 97.7 F (36.5 C)   Ht 5' 7.75 (1.721 m)   Wt 189 lb 12.8 oz (86.1 kg)   SpO2 98%   BMI 29.07 kg/m  GEN: NAD EYE: Sclerae anicteric ENT: MMM CV: Non-tachycardic Pulm: CTA b/l GI: Soft, NT/ND NEURO:  Alert & Oriented x 3   Sandor Flatter, DO [facility]   03/26/2024 9:45 AM

## 2024-03-26 NOTE — Progress Notes (Signed)
 Called to room to assist during endoscopic procedure.  Patient ID and intended procedure confirmed with present staff. Received instructions for my participation in the procedure from the performing physician.

## 2024-03-26 NOTE — Patient Instructions (Signed)

## 2024-03-26 NOTE — Progress Notes (Signed)
 Pt's states no medical or surgical changes since previsit or office visit.

## 2024-03-26 NOTE — Progress Notes (Signed)
 Vss nad trans to pacu

## 2024-03-26 NOTE — Op Note (Signed)
 [facility] Patient Name: Debbie Shaw Procedure Date: 03/26/2024 9:46 AM MRN: [id number] Endoscopist: Sandor Flatter , MD, [id number] Age: 53 Referring MD:  Date of Birth: 11-11-1970 Gender: Female Account #: [id number] Procedure:                Upper GI endoscopy Indications:              Suspected esophageal reflux, Chronic cough,                            Dyspepsia, Globus sensation, Sore throat                           Overall improvement with trial of high dose PPI                            with Protonix  40 mg BID x4 weeks and Pepcid  40 mg                            at night. Now on Protonix  40 mg daily. Still with                            daily symptoms. Medicines:                Monitored Anesthesia Care Procedure:                Pre-Anesthesia Assessment:                           - Prior to the procedure, a History and Physical                            was performed, and patient medications and                            allergies were reviewed. The patient's tolerance of                            previous anesthesia was also reviewed. The risks                            and benefits of the procedure and the sedation                            options and risks were discussed with the patient.                            All questions were answered, and informed consent                            was obtained. Prior Anticoagulants: The patient has                            taken no anticoagulant or antiplatelet agents. ASA  Grade Assessment: II - A patient with mild systemic                            disease. After reviewing the risks and benefits,                            the patient was deemed in satisfactory condition to                            undergo the procedure.                           After obtaining informed consent, the endoscope was                            passed under direct vision. Throughout  the                            procedure, the patient's blood pressure, pulse, and                            oxygen saturations were monitored continuously. The                            Olympus Scope [id number] was introduced through the                            mouth, and advanced to the second part of duodenum.                            The upper GI endoscopy was accomplished without                            difficulty. The patient tolerated the procedure                            well. Scope In: Scope Out: Findings:                 The examined esophagus was normal.                           The Z-line was regular and was found 40 cm from the                            incisors.                           The gastroesophageal flap valve was visualized                            endoscopically and classified as Hill Grade II                            (fold present, opens with respiration).  The entire examined stomach was normal. Biopsies                            were taken with a cold forceps for Helicobacter                            pylori testing. Estimated blood loss was minimal.                           The examined duodenum was normal. Complications:            No immediate complications. Estimated Blood Loss:     Estimated blood loss was minimal. Impression:               - Normal esophagus.                           - Z-line regular, 40 cm from the incisors.                           - Gastroesophageal flap valve classified as Hill                            Grade II (fold present, opens with respiration).                           - Normal stomach. Biopsied.                           - Normal examined duodenum. Recommendation:           - Patient has a contact number available for                            emergencies. The signs and symptoms of potential                            delayed complications were discussed with the                             patient. Return to normal activities tomorrow.                            Written discharge instructions were provided to the                            patient.                           - Resume previous diet.                           - Continue present medications.                           - Await pathology results.                           -  Recommend performing Esophageal Manometry and                            pH/impedance monitoring at appointment to be                            scheduled. This should be done off all acid                            suppression therapy.                           - Can follow-up in the GI clinic after completion                            of EM and pH/Impedance testing. Sandor Flatter, MD 03/26/2024 10:08:55 AM

## 2024-03-27 ENCOUNTER — Telehealth: Payer: Self-pay

## 2024-03-27 NOTE — Telephone Encounter (Signed)
 Attempted to reach patient for post-procedure f/u call. No answer. Left message for her to please not hesitate to call if she has concerns regarding her care.

## 2024-03-30 LAB — SURGICAL PATHOLOGY

## 2024-04-02 ENCOUNTER — Other Ambulatory Visit: Payer: Self-pay

## 2024-04-02 ENCOUNTER — Ambulatory Visit: Payer: Self-pay | Admitting: Gastroenterology

## 2024-04-02 DIAGNOSIS — R09A2 Foreign body sensation, throat: Secondary | ICD-10-CM

## 2024-04-02 DIAGNOSIS — R1013 Epigastric pain: Secondary | ICD-10-CM

## 2024-04-02 DIAGNOSIS — R053 Chronic cough: Secondary | ICD-10-CM

## 2024-04-02 DIAGNOSIS — R490 Dysphonia: Secondary | ICD-10-CM

## 2024-04-02 DIAGNOSIS — K219 Gastro-esophageal reflux disease without esophagitis: Secondary | ICD-10-CM

## 2024-04-02 NOTE — Addendum Note (Signed)
 Addended by: BENJAMINE NAT DEL on: 04/02/2024 03:50 PM   Modules accepted: Orders

## 2024-04-22 ENCOUNTER — Other Ambulatory Visit: Payer: Self-pay | Admitting: Gastroenterology

## 2024-04-22 DIAGNOSIS — K219 Gastro-esophageal reflux disease without esophagitis: Secondary | ICD-10-CM

## 2024-07-11 ENCOUNTER — Encounter (HOSPITAL_COMMUNITY): Payer: Self-pay

## 2024-07-11 ENCOUNTER — Ambulatory Visit (HOSPITAL_COMMUNITY): Admit: 2024-07-11 | Admitting: Gastroenterology

## 2024-07-11 SURGERY — MONITORING, ESOPHAGEAL PH, 24 HOUR

## 2024-07-12 ENCOUNTER — Encounter: Admitting: Family Medicine
